# Patient Record
Sex: Male | Born: 1950 | Race: White | Hispanic: No | Marital: Married | Smoking: Former smoker
Health system: Southern US, Community
[De-identification: ages and names within clinical notes are randomized; demographics above are authoritative.]

## PROBLEM LIST (undated history)

## (undated) DIAGNOSIS — I10 Essential (primary) hypertension: Secondary | ICD-10-CM

## (undated) DIAGNOSIS — I214 Non-ST elevation (NSTEMI) myocardial infarction: Secondary | ICD-10-CM

## (undated) DIAGNOSIS — C4490 Unspecified malignant neoplasm of skin, unspecified: Secondary | ICD-10-CM

## (undated) DIAGNOSIS — M542 Cervicalgia: Secondary | ICD-10-CM

## (undated) DIAGNOSIS — E785 Hyperlipidemia, unspecified: Secondary | ICD-10-CM

## (undated) DIAGNOSIS — C61 Malignant neoplasm of prostate: Secondary | ICD-10-CM

## (undated) DIAGNOSIS — I2510 Atherosclerotic heart disease of native coronary artery without angina pectoris: Secondary | ICD-10-CM

## (undated) DIAGNOSIS — R7303 Prediabetes: Secondary | ICD-10-CM

## (undated) DIAGNOSIS — Z87442 Personal history of urinary calculi: Secondary | ICD-10-CM

## (undated) DIAGNOSIS — M1990 Unspecified osteoarthritis, unspecified site: Secondary | ICD-10-CM

## (undated) HISTORY — DX: Non-ST elevation (NSTEMI) myocardial infarction: I21.4

## (undated) HISTORY — PX: LITHOTRIPSY: SUR834

## (undated) HISTORY — DX: Cervicalgia: M54.2

## (undated) HISTORY — DX: Atherosclerotic heart disease of native coronary artery without angina pectoris: I25.10

## (undated) HISTORY — PX: TONSILLECTOMY: SUR1361

## (undated) HISTORY — DX: Malignant neoplasm of prostate: C61

## (undated) HISTORY — DX: Essential (primary) hypertension: I10

## (undated) HISTORY — DX: Hyperlipidemia, unspecified: E78.5

---

## 2006-06-15 ENCOUNTER — Encounter (INDEPENDENT_AMBULATORY_CARE_PROVIDER_SITE_OTHER): Payer: Self-pay | Admitting: Specialist

## 2006-06-15 ENCOUNTER — Ambulatory Visit (HOSPITAL_COMMUNITY): Admission: RE | Admit: 2006-06-15 | Discharge: 2006-06-16 | Payer: Self-pay | Admitting: Urology

## 2006-11-21 HISTORY — PX: PROSTATECTOMY: SHX69

## 2010-10-21 DIAGNOSIS — I214 Non-ST elevation (NSTEMI) myocardial infarction: Secondary | ICD-10-CM

## 2010-10-21 HISTORY — DX: Non-ST elevation (NSTEMI) myocardial infarction: I21.4

## 2010-10-21 HISTORY — PX: CORONARY ARTERY BYPASS GRAFT: SHX141

## 2010-11-08 ENCOUNTER — Inpatient Hospital Stay (HOSPITAL_COMMUNITY)
Admission: EM | Admit: 2010-11-08 | Discharge: 2010-11-12 | Payer: Self-pay | Attending: Thoracic Surgery (Cardiothoracic Vascular Surgery) | Admitting: Thoracic Surgery (Cardiothoracic Vascular Surgery)

## 2010-11-08 ENCOUNTER — Encounter: Payer: Self-pay | Admitting: Thoracic Surgery (Cardiothoracic Vascular Surgery)

## 2010-12-06 ENCOUNTER — Encounter
Admission: RE | Admit: 2010-12-06 | Discharge: 2010-12-06 | Payer: Self-pay | Source: Home / Self Care | Attending: Thoracic Surgery (Cardiothoracic Vascular Surgery) | Admitting: Thoracic Surgery (Cardiothoracic Vascular Surgery)

## 2010-12-06 ENCOUNTER — Ambulatory Visit
Admission: RE | Admit: 2010-12-06 | Discharge: 2010-12-06 | Payer: Self-pay | Source: Home / Self Care | Attending: Thoracic Surgery (Cardiothoracic Vascular Surgery) | Admitting: Thoracic Surgery (Cardiothoracic Vascular Surgery)

## 2010-12-07 ENCOUNTER — Ambulatory Visit: Payer: Self-pay | Admitting: Cardiology

## 2010-12-24 ENCOUNTER — Ambulatory Visit: Payer: Self-pay | Admitting: *Deleted

## 2010-12-28 ENCOUNTER — Ambulatory Visit (INDEPENDENT_AMBULATORY_CARE_PROVIDER_SITE_OTHER): Payer: Federal, State, Local not specified - PPO | Admitting: *Deleted

## 2010-12-28 ENCOUNTER — Encounter: Payer: Self-pay | Admitting: *Deleted

## 2010-12-28 DIAGNOSIS — I10 Essential (primary) hypertension: Secondary | ICD-10-CM

## 2010-12-28 DIAGNOSIS — I214 Non-ST elevation (NSTEMI) myocardial infarction: Secondary | ICD-10-CM

## 2010-12-28 DIAGNOSIS — E785 Hyperlipidemia, unspecified: Secondary | ICD-10-CM | POA: Insufficient documentation

## 2010-12-28 DIAGNOSIS — I2510 Atherosclerotic heart disease of native coronary artery without angina pectoris: Secondary | ICD-10-CM

## 2010-12-28 DIAGNOSIS — C61 Malignant neoplasm of prostate: Secondary | ICD-10-CM

## 2010-12-28 NOTE — Assessment & Plan Note (Signed)
He will start checking his blood pressure at home. He has had no meds today prior to his visit. We will continue with his current regimen.

## 2010-12-28 NOTE — Patient Instructions (Signed)
Overall, he is felt to be doing well. We will plan on seeing him back in about 3 months. Diet, activity, and lifting restrictions are reviewed with him.

## 2010-12-28 NOTE — Assessment & Plan Note (Signed)
He is doing well. We will keep him on his same meds.

## 2010-12-28 NOTE — Progress Notes (Signed)
Mr. Ralph [name] is seen today for a follow up visit. He is doing well. He is walking 2 miles per day. He has had no chest pain or shortness of breath. His appetite is good and he has gained weight. He understands that he needs to cut back on his portions. He has not returned to work yet. He works for the [employer] and still has lifting restrictions. He is tolerating his meds. He has no complaint.  The 15 point review of systems is reviewed. All other review of systems are negative.  Exam:  He is very pleasant and in no acute distress. Vital signs are noted. Skin is warm and dry. Color is normal. HEENT is unremarkable. Lungs are clear. Cardiac exam shows a regular rate and rhythm. Abdomen is soft. He has no peripheral edema. Gait and ROM are intact. He has no neurologic deficits. Left arm incision and sternal incision are stable.

## 2010-12-28 NOTE — Assessment & Plan Note (Signed)
Will continue with statin therapy. Labs will be checked today.

## 2011-01-31 LAB — CBC
HCT: 26.6 % — ABNORMAL LOW (ref 39.0–52.0)
HCT: 33.1 % — ABNORMAL LOW (ref 39.0–52.0)
HCT: 42.4 % (ref 39.0–52.0)
Hemoglobin: 14.4 g/dL (ref 13.0–17.0)
MCH: 30.7 pg (ref 26.0–34.0)
MCH: 30.7 pg (ref 26.0–34.0)
MCH: 30.7 pg (ref 26.0–34.0)
MCHC: 34 g/dL (ref 30.0–36.0)
MCHC: 34.1 g/dL (ref 30.0–36.0)
MCHC: 34.4 g/dL (ref 30.0–36.0)
MCV: 88.7 fL (ref 78.0–100.0)
MCV: 90.4 fL (ref 78.0–100.0)
Platelets: 112 10*3/uL — ABNORMAL LOW (ref 150–400)
Platelets: 123 10*3/uL — ABNORMAL LOW (ref 150–400)
Platelets: 124 10*3/uL — ABNORMAL LOW (ref 150–400)
RBC: 3.36 MIL/uL — ABNORMAL LOW (ref 4.22–5.81)
RBC: 4.69 MIL/uL (ref 4.22–5.81)
RDW: 12.2 % (ref 11.5–15.5)
RDW: 12.2 % (ref 11.5–15.5)
RDW: 12.3 % (ref 11.5–15.5)
RDW: 12.5 % (ref 11.5–15.5)
WBC: 12.2 10*3/uL — ABNORMAL HIGH (ref 4.0–10.5)

## 2011-01-31 LAB — COMPREHENSIVE METABOLIC PANEL
ALT: 12 U/L (ref 0–53)
Albumin: 3.3 g/dL — ABNORMAL LOW (ref 3.5–5.2)
Alkaline Phosphatase: 49 U/L (ref 39–117)
BUN: 12 mg/dL (ref 6–23)
Chloride: 107 mEq/L (ref 96–112)
Glucose, Bld: 84 mg/dL (ref 70–99)
Potassium: 3.5 mEq/L (ref 3.5–5.1)
Sodium: 139 mEq/L (ref 135–145)
Total Bilirubin: 0.7 mg/dL (ref 0.3–1.2)
Total Protein: 5.9 g/dL — ABNORMAL LOW (ref 6.0–8.3)

## 2011-01-31 LAB — BASIC METABOLIC PANEL
BUN: 12 mg/dL (ref 6–23)
BUN: 20 mg/dL (ref 6–23)
CO2: 29 mEq/L (ref 19–32)
Calcium: 7.6 mg/dL — ABNORMAL LOW (ref 8.4–10.5)
Calcium: 8.1 mg/dL — ABNORMAL LOW (ref 8.4–10.5)
Calcium: 8.8 mg/dL (ref 8.4–10.5)
Chloride: 107 mEq/L (ref 96–112)
Creatinine, Ser: 0.99 mg/dL (ref 0.4–1.5)
GFR calc Af Amer: >60 mL/min (ref >60)
GFR calc non Af Amer: >60 mL/min (ref >60)
GFR calc non Af Amer: >60 mL/min (ref >60)
Glucose, Bld: 121 mg/dL — ABNORMAL HIGH (ref 70–99)
Glucose, Bld: 89 mg/dL (ref 70–99)
Potassium: 4.1 mEq/L (ref 3.5–5.1)
Potassium: 4.5 mEq/L (ref 3.5–5.1)
Sodium: 136 mEq/L (ref 135–145)
Sodium: 140 mEq/L (ref 135–145)

## 2011-01-31 LAB — POCT I-STAT GLUCOSE
Glucose, Bld: 104 mg/dL — ABNORMAL HIGH (ref 70–99)
Glucose, Bld: 118 mg/dL — ABNORMAL HIGH (ref 70–99)
Operator id: 3342
Operator id: 3342

## 2011-01-31 LAB — POCT I-STAT 4, (NA,K, GLUC, HGB,HCT)
Glucose, Bld: 155 mg/dL — ABNORMAL HIGH (ref 70–99)
Glucose, Bld: 83 mg/dL (ref 70–99)
Glucose, Bld: 98 mg/dL (ref 70–99)
HCT: 28 % — ABNORMAL LOW (ref 39.0–52.0)
HCT: 31 % — ABNORMAL LOW (ref 39.0–52.0)
HCT: 39 % (ref 39.0–52.0)
Hemoglobin: 10.9 g/dL — ABNORMAL LOW (ref 13.0–17.0)
Hemoglobin: 11.9 g/dL — ABNORMAL LOW (ref 13.0–17.0)
Hemoglobin: 13.3 g/dL (ref 13.0–17.0)
Hemoglobin: 9.5 g/dL — ABNORMAL LOW (ref 13.0–17.0)
Potassium: 3.7 mEq/L (ref 3.5–5.1)
Potassium: 3.9 mEq/L (ref 3.5–5.1)
Potassium: 4.4 mEq/L (ref 3.5–5.1)
Sodium: 136 mEq/L (ref 135–145)
Sodium: 139 mEq/L (ref 135–145)

## 2011-01-31 LAB — POCT I-STAT 3, ART BLOOD GAS (G3+)
Acid-base deficit: 1 mmol/L (ref 0.0–2.0)
Acid-base deficit: 3 mmol/L — ABNORMAL HIGH (ref 0.0–2.0)
Acid-base deficit: 4 mmol/L — ABNORMAL HIGH (ref 0.0–2.0)
Bicarbonate: 22.4 mEq/L (ref 20.0–24.0)
Bicarbonate: 22.9 mEq/L (ref 20.0–24.0)
Bicarbonate: 24 mEq/L (ref 20.0–24.0)
O2 Saturation: 100 %
O2 Saturation: 100 %
O2 Saturation: 93 %
O2 Saturation: 95 %
Patient temperature: 36.4
TCO2: 24 mmol/L (ref 0–100)
TCO2: 24 mmol/L (ref 0–100)
TCO2: 25 mmol/L (ref 0–100)
pCO2 arterial: 42.1 mmHg (ref 35.0–45.0)
pCO2 arterial: 47.2 mmHg — ABNORMAL HIGH (ref 35.0–45.0)
pH, Arterial: 7.385 (ref 7.350–7.450)
pO2, Arterial: 337 mmHg — ABNORMAL HIGH (ref 80.0–100.0)
pO2, Arterial: 64 mmHg — ABNORMAL LOW (ref 80.0–100.0)
pO2, Arterial: 82 mmHg (ref 80.0–100.0)

## 2011-01-31 LAB — BLOOD GAS, ARTERIAL
Drawn by: 275531
FIO2: 0.21 %
Patient temperature: 98.6
pCO2 arterial: 40.6 mmHg (ref 35.0–45.0)
pH, Arterial: 7.386 (ref 7.350–7.450)

## 2011-01-31 LAB — URINALYSIS, ROUTINE W REFLEX MICROSCOPIC
Bilirubin Urine: NEGATIVE
Glucose, UA: NEGATIVE mg/dL
Hgb urine dipstick: NEGATIVE
Specific Gravity, Urine: 1.015 (ref 1.005–1.030)
Urobilinogen, UA: 1 mg/dL (ref 0.0–1.0)

## 2011-01-31 LAB — GLUCOSE, CAPILLARY
Glucose-Capillary: 123 mg/dL — ABNORMAL HIGH (ref 70–99)
Glucose-Capillary: 132 mg/dL — ABNORMAL HIGH (ref 70–99)
Glucose-Capillary: 155 mg/dL — ABNORMAL HIGH (ref 70–99)
Glucose-Capillary: 77 mg/dL (ref 70–99)

## 2011-01-31 LAB — MAGNESIUM
Magnesium: 3 mg/dL — ABNORMAL HIGH (ref 1.5–2.5)
Magnesium: 3.5 mg/dL — ABNORMAL HIGH (ref 1.5–2.5)

## 2011-01-31 LAB — ABO/RH: ABO/RH(D): O NEG

## 2011-01-31 LAB — POCT I-STAT, CHEM 8
BUN: 11 mg/dL (ref 6–23)
Calcium, Ion: 1.11 mmol/L — ABNORMAL LOW (ref 1.12–1.32)
Creatinine, Ser: 0.9 mg/dL (ref 0.4–1.5)
TCO2: 23 mmol/L (ref 0–100)

## 2011-01-31 LAB — POCT I-STAT 3, VENOUS BLOOD GAS (G3P V)
TCO2: 27 mmol/L (ref 0–100)
pCO2, Ven: 51.9 mmHg — ABNORMAL HIGH (ref 45.0–50.0)
pH, Ven: 7.294 (ref 7.250–7.300)
pO2, Ven: 49 mmHg — ABNORMAL HIGH (ref 30.0–45.0)

## 2011-01-31 LAB — PROTIME-INR
INR: 1 (ref 0.00–1.49)
INR: 1.39 (ref 0.00–1.49)

## 2011-01-31 LAB — CROSSMATCH
ABO/RH(D): O NEG
Unit division: 0

## 2011-01-31 LAB — MRSA PCR SCREENING: MRSA by PCR: NEGATIVE

## 2011-01-31 LAB — HEMOGLOBIN AND HEMATOCRIT, BLOOD
HCT: 31.4 % — ABNORMAL LOW (ref 39.0–52.0)
Hemoglobin: 11 g/dL — ABNORMAL LOW (ref 13.0–17.0)

## 2011-01-31 LAB — HEMOGLOBIN A1C: Hgb A1c MFr Bld: 5.4 % (ref <5.7)

## 2011-03-24 ENCOUNTER — Ambulatory Visit (INDEPENDENT_AMBULATORY_CARE_PROVIDER_SITE_OTHER): Payer: Federal, State, Local not specified - PPO | Admitting: Cardiology

## 2011-03-24 ENCOUNTER — Encounter: Payer: Self-pay | Admitting: Cardiology

## 2011-03-24 VITALS — BP 112/64 | HR 56 | Ht 68.0 in | Wt 203.0 lb

## 2011-03-24 DIAGNOSIS — E7800 Pure hypercholesterolemia, unspecified: Secondary | ICD-10-CM

## 2011-03-24 DIAGNOSIS — I2510 Atherosclerotic heart disease of native coronary artery without angina pectoris: Secondary | ICD-10-CM

## 2011-03-24 LAB — BASIC METABOLIC PANEL
BUN: 22 mg/dL (ref 6–23)
Creatinine, Ser: 1 mg/dL (ref 0.4–1.5)
Glucose, Bld: 92 mg/dL (ref 70–99)
Potassium: 4.3 mEq/L (ref 3.5–5.1)
Sodium: 137 mEq/L (ref 135–145)

## 2011-03-24 LAB — LIPID PANEL
LDL Cholesterol: 111 mg/dL — ABNORMAL HIGH (ref 0–99)
Total CHOL/HDL Ratio: 3
Triglycerides: 89 mg/dL (ref 0.0–149.0)

## 2011-03-24 LAB — HEPATIC FUNCTION PANEL
Albumin: 3.7 g/dL (ref 3.5–5.2)
Alkaline Phosphatase: 64 U/L (ref 39–117)
Total Bilirubin: 0.8 mg/dL (ref 0.3–1.2)

## 2011-03-24 NOTE — Assessment & Plan Note (Signed)
Is doing well from a cardiac standpoint we'll continue his current medications. I will have him see Dr. Simona Huh in each in in approximate 8 months it anniversary of his bypass surgery. We will check C. Met and lipids today.

## 2011-03-24 NOTE — Progress Notes (Signed)
Subjective:   Ralph [name] is seen today for followup after having coronary artery bypass grafting in December of 2012 in general, he's done well he is back exercising and playing some golf on occasion. He had a history of multivessel coronary disease with an ejection fraction of 60%. He had coronary artery bypass grafting with a LIMA to the LAD, left radial artery to the obtuse marginal 2, saphenous vein graft to diagonal, a saphenous vein graft to the posterior descending artery. Overall, he's done well from a cardiac standpoint.  His other problems include hypertension, hyperlipemia, remote tobacco abuse, a history of prostate cancer with previous radical prostatectomy.  Current Outpatient Prescriptions  Medication Sig Dispense Refill  . aspirin 325 MG EC tablet Take 325 mg by mouth daily.        . fish oil-omega-3 fatty acids 1000 MG capsule Take by mouth daily.       Marland Kitchen glucosamine-chondroitin 500-400 MG tablet Take 3 tablets by mouth daily.       Marland Kitchen lisinopril (PRINIVIL,ZESTRIL) 5 MG tablet Take 5 mg by mouth daily.        . metoprolol (LOPRESSOR) 50 MG tablet Take 50 mg by mouth 2 (two) times daily.        . simvastatin (ZOCOR) 40 MG tablet Take 40 mg by mouth at bedtime.        Marland Kitchen DISCONTD: multivitamin (THERAGRAN) per tablet Take 1 tablet by mouth daily.          No Known Allergies  Patient Active Problem List  Diagnoses  . CAD (coronary artery disease)  . HTN (hypertension)  . Hyperlipidemia  . Prostate cancer  . NSTEMI (non-ST elevated myocardial infarction)    History  Smoking status  . Former Smoker -- 0.8 packs/day for 10 years  . Types: Cigarettes  . Quit date: 11/21/1988  Smokeless tobacco  . Never Used  Comment: Stopped over 20 years ago.    History  Alcohol Use No    Family History  Problem Relation Age of Onset  . Alzheimer's disease Mother   . Stroke Father     Review of Systems:   The patient denies any heat or cold intolerance.  No weight gain or weight  loss.  The patient denies headaches or blurry vision.  There is no cough or sputum production.  The patient denies dizziness.  There is no hematuria or hematochezia. He does have some urinary frequency that is worse since he has low back pain. The patient denies any muscle aches or arthritis.  The patient denies any rash.  The patient denies frequent falling or instability.  There is no history of depression or anxiety.  All other systems were reviewed and are negative.   Physical Exam:   Weight is 203. Blood pressure is 112/64 heart rate 56.The head is normocephalic and atraumatic.  Pupils are equally round and reactive to light.  Sclerae nonicteric.  Conjunctiva is clear.  Oropharynx is unremarkable.  There's adequate oral airway.  Neck is supple there are no masses.  Thyroid is not enlarged.  There is no lymphadenopathy.  Lungs are clear.  Chest is symmetric.  Heart shows a regular rate and rhythm.  S1 and S2 are normal.  There is no murmur click or gallop.  Abdomen is soft normal bowel sounds.  There is no organomegaly.  Genital and rectal deferred.  Extremities are without edema.  Peripheral pulses are adequate.  Neurologically intact.  Full range of motion.  The patient is not  depressed.  Skin is warm and dry.  Assessment / Plan:

## 2011-03-28 ENCOUNTER — Other Ambulatory Visit: Payer: Self-pay | Admitting: *Deleted

## 2011-03-28 DIAGNOSIS — E785 Hyperlipidemia, unspecified: Secondary | ICD-10-CM

## 2011-03-28 MED ORDER — ATORVASTATIN CALCIUM 40 MG PO TABS: 40.0000 mg | ORAL_TABLET | Freq: Every day | ORAL | Status: DC

## 2011-04-05 NOTE — Assessment & Plan Note (Signed)
OFFICE VISIT   Ralph [name]  DOB:  1951/09/12                                        December 06, 2010  CHART #:  [id number]   HISTORY OF PRESENT ILLNESS:  The patient returns for routine followup,  status post coronary artery bypass grafting x4 on November 09, 2010.  His postoperative recovery has been uncomplicated.  Since hospital  discharge, the patient has continued to do well.  He was seen in  followup by Dr. Deborah [name] last week and returns to our office for routine  followup today.  He reports only mild residual soreness in his chest.  He is not using any sort of pain relievers at this time.  He still has  some numbness along the left anterior chest wall as well as along the  left radial artery harvest incision.  Otherwise, he feels pretty good.  His appetite is good.  He is sleeping better at night.  He has no  shortness of breath.  The remainder of his review of systems is  unremarkable.   Current medications include aspirin, Lopressor, Zocor, fish oil capsule,  glucosamine.   PHYSICAL EXAMINATION:  General:  Notable for well-appearing male, with  blood pressure 101/69, pulse 60 and regular, oxygen saturation 98% on  room air.  Chest:  Examination of the chest reveals a median sternotomy  incision that is healing very nicely.  The sternum is stable on  palpation.  Breath sounds are clear to auscultation and symmetrical  bilaterally.  No wheezes, rales or rhonchi are noted.  Cardiovascular:  Regular rate and rhythm.  No murmurs, rubs or gallops are appreciated.  The left forearm radial artery harvest incision is healing nicely.  The  left hand is intact from a neurovascular standpoint.  The small incision  in the right thigh from endoscopic vein harvest is also healing well.  Extremities:  There is no lower extremity edema.   DIAGNOSTIC TEST:  Chest x-ray performed today at the [facility] is reviewed.  This demonstrates clear  lung fields bilaterally.  There is trivial left pleural effusion.  All the sternal wires appear  intact.  No other abnormalities are noted.   IMPRESSION:  Excellent progress following recent coronary artery bypass  grafting.   PLAN:  I have encouraged the patient to continue to gradually increase  his physical activity as tolerated with his primary limitation at this  point remaining that he refrain from heavy lifting or strenuous use of  his arms or shoulders for at least another 2 months.  He is asked about  golfing.  I have suggested that he could start working on chipping and  pudding around the green, but it will be another 2 months before he can  take a full sling.  I do think it is reasonable for him to resume  driving an automobile at this time.  I have also encouraged him to get  involved in the cardiac rehab program.  All of his questions have been  addressed.  In the future, the patient will call and return to see [place] as  needed.   [name], M.D.  Electronically Signed   CHO/MEDQ  D:  12/06/2010  T:  12/06/2010  Job:  [id number]   cc:   Ralph [name], M.D.  Ralph [name]  Ralph [name], M.D.

## 2011-04-08 NOTE — Op Note (Signed)
NAME:  Ralph [name], Ralph [name]               ACCOUNT NO.:  [id number]   MEDICAL RECORD NO.:  [id number]          PATIENT TYPE:  AMB   LOCATION:  DAY                          FACILITY:  [facility]   PHYSICIAN:  Excell Seltzer. Annabell Howells, M.D.    DATE OF BIRTH:  [birth year]   DATE OF PROCEDURE:  06/15/2006  DATE OF DISCHARGE:                                 OPERATIVE REPORT   PROCEDURE:  Robot-assisted laparoscopic radical prostatectomy and pelvic  lymph node dissection.   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   POSTOPERATIVE DIAGNOSES:  Stage T2B Gleason 7 adenocarcinoma of the  prostate.   SURGEON:  Excell Seltzer. Annabell Howells, M.D.   ASSISTANT:  Crecencio Mc, M.D.   ANESTHESIA:  General.   DRAINS:  Foley catheter and Blake drain.   SPECIMENS:  Prostate, seminal vesicles and right and left pelvic lymph  nodes.   BLOOD LOSS:  200 mL.   COMPLICATIONS:  None.   INDICATIONS:  [name] is a 60 year old white male, who was found to have  an elevated PSA and nodular prostate by Dr. Rito Ehrlich.  A prostate biopsy was  performed, which revealed extensive disease in the right prostatic lobe with  all six cores being involved with 50%-80% cancer with the highest Gleason  score being a 3 + 4 in the right upper paramedian biopsy.  All of the left  biopsies were benign.  On physical exam, he has a definite T2B lesion.  After discussion of the options, he has elected a robotic-assisted  prostatectomy.  We discussed the need not to do a nerve spare on the right  side, due to the bulk of this disease and the physical exam findings.   FINDINGS AND PROCEDURE:  The patient was fitted with TAS hose.  He was taken  to the operating room, where general anesthetic was induced.  He was given  Ancef preoperatively.  He had had a mechanical bowel prep, as well.  He was  placed in the lithotomy position.  His abdomen was shaved.  A red rubber  rectal tube was placed.  He was prepped with Betadine  solution, then draped  in the usual sterile fashion.  An Op-Site was placed to secure the drapes.   A periumbilical incision was made, 18 cm cephalad to the pubis,  approximately 3 cm in length.  The fascia was incised with the Bovie.  The  peritoneum was identified, incised with a knife, and punctured with a  hemostat.  Finger dissection was used to insure no adhesions to the anterior  abdominal wall.  A Hasson trocar was then placed and the incision was  secured around it with 2-0 Vicryl sutures.  The abdomen was then inspected  with the camera, while the abdomen was insufflated.  At this point,  additional ports were placed.  A 12 mm port was placed laterally on the  right.  A 5 mm port was placed to the right and superior to the  periumbilical port and 8 mm ports were placed 10 cm lateral to the  periumbilical port on each  side, 16 cm above the pubis.  The fourth arm port  was placed approximately 8-10 cm lateral to the other left 8 mm port.   Once all the ports were in position and the Foley catheter had been placed  and the bladder drained, the robot was brought onto the field and engaged.  Initially, monopolar scissors and a TK dissector were used in the first and  second working ports and the third working port was a blunt grasper.  The  bladder was then filled with sterile fluid and the dissection was initiated.   The peritoneum was incised anteriorly, superior to the bladder, and the  bladder was then dropped off the anterior abdominal wall, exposing the  anterior aspect of the prostate.  The endopelvic fascia was incised lateral  to the prostate, initially on the right, and the lateral plane was developed  between the levators and the prostate.  This was carried down to the  perirectal fat.  The puboprostatic ligaments were incised with cautery, as  well.  The left endopelvic fascia was then incised and dissection was  performed to free up the left lateral aspect of the  prostate.   At this point, an endo-GIA stapler was placed through the 12 mm working port  and the dorsal vein complex was stapled and divided.   At this point, we turned our attention to the bladder neck, which was  located, using tension on the Foley balloon.  The bladder neck was then  incised with the monopolar scissors, down to the bladder neck.  The Foley  was identified and then delivered from the prostate and then secured over  the pubis, using the fourth arm.   We then changed to a 30 degree down scope and incised the posterior bladder  neck, exposing the structures.  The right vas was then dissected out and  divided.  The right seminal vesicle was then dissected out, using monopolar  cautery.  Once the right structures were freed, the left vas was then  dissected out and divided, followed by dissection of the left seminal  vesicle.  Once all the structures had been freed, they were grasped and held  cephalad while the posterior plane was developed between Denonvilliers' and  rectal fat.  Once the posterior plane had been adequately developed out to  the apex of the prostate, we turned our attention to the left nerve-spare.   The Foley was removed, freeing the prostate, and the fourth arm was moved up  into the abdominal cavity.  The prostate was grasped with the Prestige and  held to the right.  The periprostatic fascia was identified and incised on  the anterior aspect of the prostate.  The plane was then developed to  perform the nerve-spare.  The fascia with the nerve bundle was then  reflected off the prostate, using sharp and blunt dissection.  Once the  nerve had been freed from base to apex, we turned our attention to the left  pedicle.  The pedicle was then developed in packets and controlled with  clips and the tissue was divided, elevating the left side of the prostate.  Great care was taken to avoid entry into the prostatic capsule.  Once the pedicle had been  completely divided, the remainder of the attachments  between the prostate and the nerve bundle were then taken down sharply, up  to the apex of the prostate.   We then turned our attention to the right prostatic pedicle.  A nerve-spare  was not performed on this side, due to the extent of the patient's cancer.  The pedicle was taken down, using the monopolar scissors and clips, out to  the apex, carrying the neurovascular bundle tissues with the prostate.   Once the pedicle had been taken down on the right, we turned our attention  to the apex.  There was some bleeding a the dorsal vein, so a figure-of-  eight 2-0 Vicryl was placed, using a CT needle, and tied.  This provided  excellent hemostasis.   The urethra was then incised initially with cautery through the residual  dorsal vein complex and sharply with scissors.  Once the Foley had been  exposed, it was pulled back and the lateral and posterior urethra were  divided.  The residual apical attachments were then taken down and the  prostate was moved into the upper pelvis.   Inspection of the pelvic fossa revealed some bleeding on the left and was  controlled with clips with great care being taken to avoid the neurovascular  bundle.  We then placed some Surgicel in the apical bed to provide  additional hemostasis for some mild oozing.   At this point, we turned our attention to node dissection.  The working arms  were moved to provide exposure and the left pelvic lymph node dissection was  performed.  The iliac vein was identified and the lymphatic tissue was  reflected inferomedially off the vein.  The obturator nerve and accessory  obturator vessels were identified and were avoided.  The nerve packet was  divided distally with clips and divided.  The proximal extent was then  dissected out, divided after clipping, and the tissue was removed.  Some  residual tissue near the pubis was dissected out and removed, as well.   Hemostasis was felt to be adequate.   We then turned our attention to the right node package.  The lymphatic  packet was then developed off the iliac vein until the pelvic side wall  fascia was identified.  The obturator nerve was then identified medially and  the packet was freed from the nerve.  The packet was then clipped anteriorly  and divided and then dissected out more posteriorly and clipped proximally  and divided.  The node package was then removed.  There was some minor  oozing in the right dissection bed.  This was inspected, did not seem to be  significant, and the Surgicel was moved from the apical bed to the right  node dissection bed.   At this point, inspection of the pelvic floor revealed no active bleeding.  Previously, the pelvic floor had been irrigated and the rectum insufflated  with no evidence of rectal injury.   At this point, we proceeded with our anastomosis.  A 3-0 Vicryl on an SH needle was used to provide a stay suture to relieve tension.  Once this had  been placed at the 6 o'clock position, 3-0 Monocryl on RB1 needles was then  used to perform the anastomosis.  The left side was performed with a running  brown Monocryl, the right side with a running purple Monocryl.  These had  been tied together prior to placement.  Once the anastomosis had been  completed, the suture line was tightened and tied and a #20 [place] Coude  Foley catheter was placed without difficulty into the bladder.  The catheter  was irrigated without evidence of anastomotic leakage and with easy return.  The sutures were  trimmed and all needles were removed.   At this point, the round Blake drain was placed through the fourth arm  working port and positioned in the pelvis.  The working port was then  removed and the drain was secured with a nylon stitch.   We then removed the robot and used the camera through the 12 mm port to  position the laparoscopic entrapment sack.  The prostate  was placed within  the sack and the sack was then deployed and the camera was then replaced  through the umbilical port.   At this point, all of the remaining ports were removed under direct vision.  The 12 mm port was closed, using 3-0 Vicryl on a needle passer and the  specimen was then removed through the umbilical port.  Some enlargement of  the fascial incision was required.  At this point, the anterior rectus  fascia of the umbilical port was closed, using a running 0 Vicryl.  All of  the port sites were infiltrated with 0.25% Marcaine and the skin was closed  with clips for all of the ports.   At this point, the drapes were removed.  The catheter was placed to straight  drainage, the drain to bulb suction, a dressing was applied.  The patient  was taken down from lithotomy position, his anesthetic was reversed.  He was  moved to the recovery room in stable condition.  There were no  complications.      Excell Seltzer. Annabell Howells, M.D.  Electronically Signed     JJW/MEDQ  D:  06/15/2006  T:  06/15/2006  Job:  [id number]   cc:   Dennie Maizes, M.D.  Fax: [phone]   Wyvonnia Lora  Fax: [phone]

## 2011-05-06 IMAGING — CR DG CHEST 2V
2 series · 2 of 2 positions shown · non-contrast
Comparison: 11/11/2010 and earlier.

CLINICAL DATA: 59-year-old male status post CABG and October 2010.

CHEST - 2 VIEW

[w chest pa]
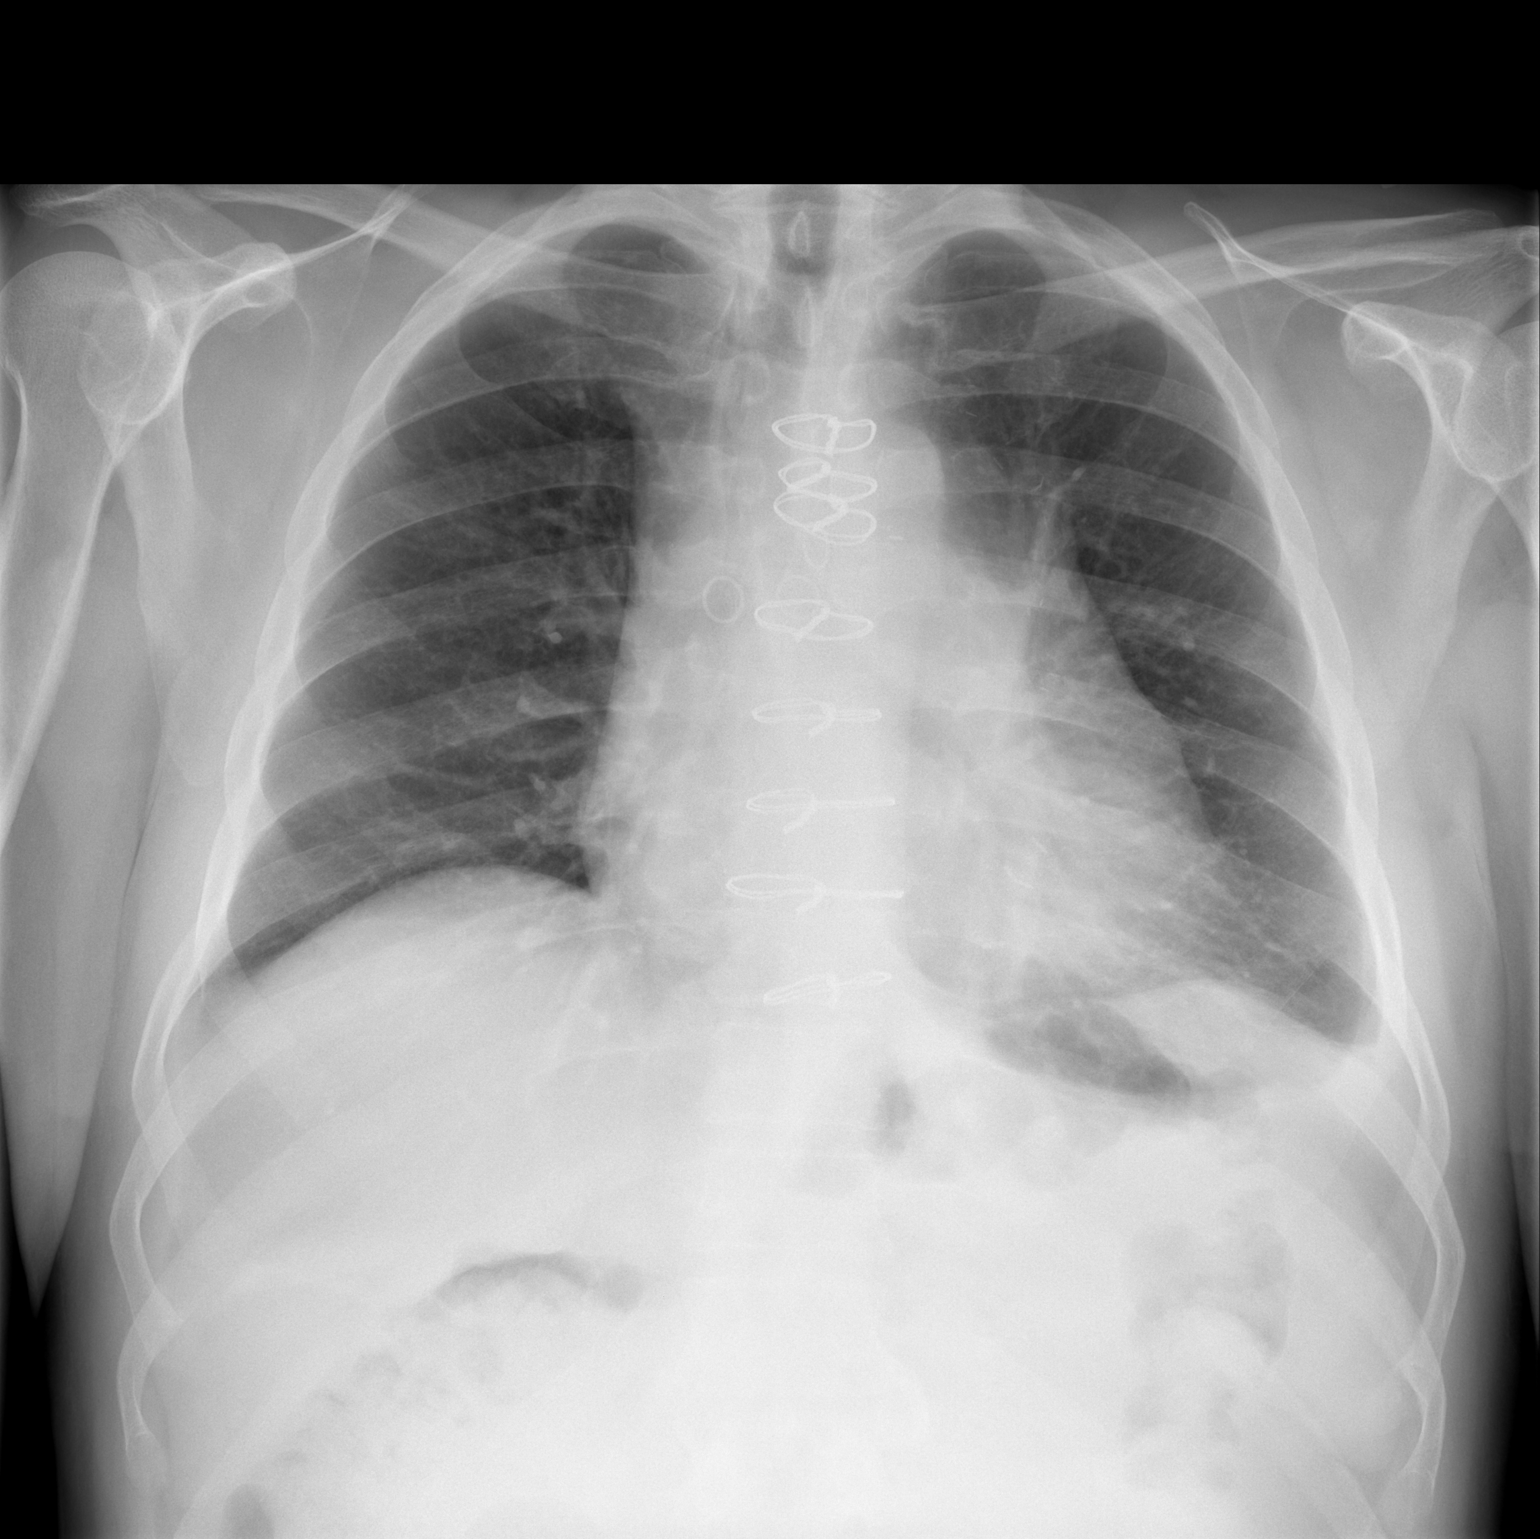

[w chest lat]
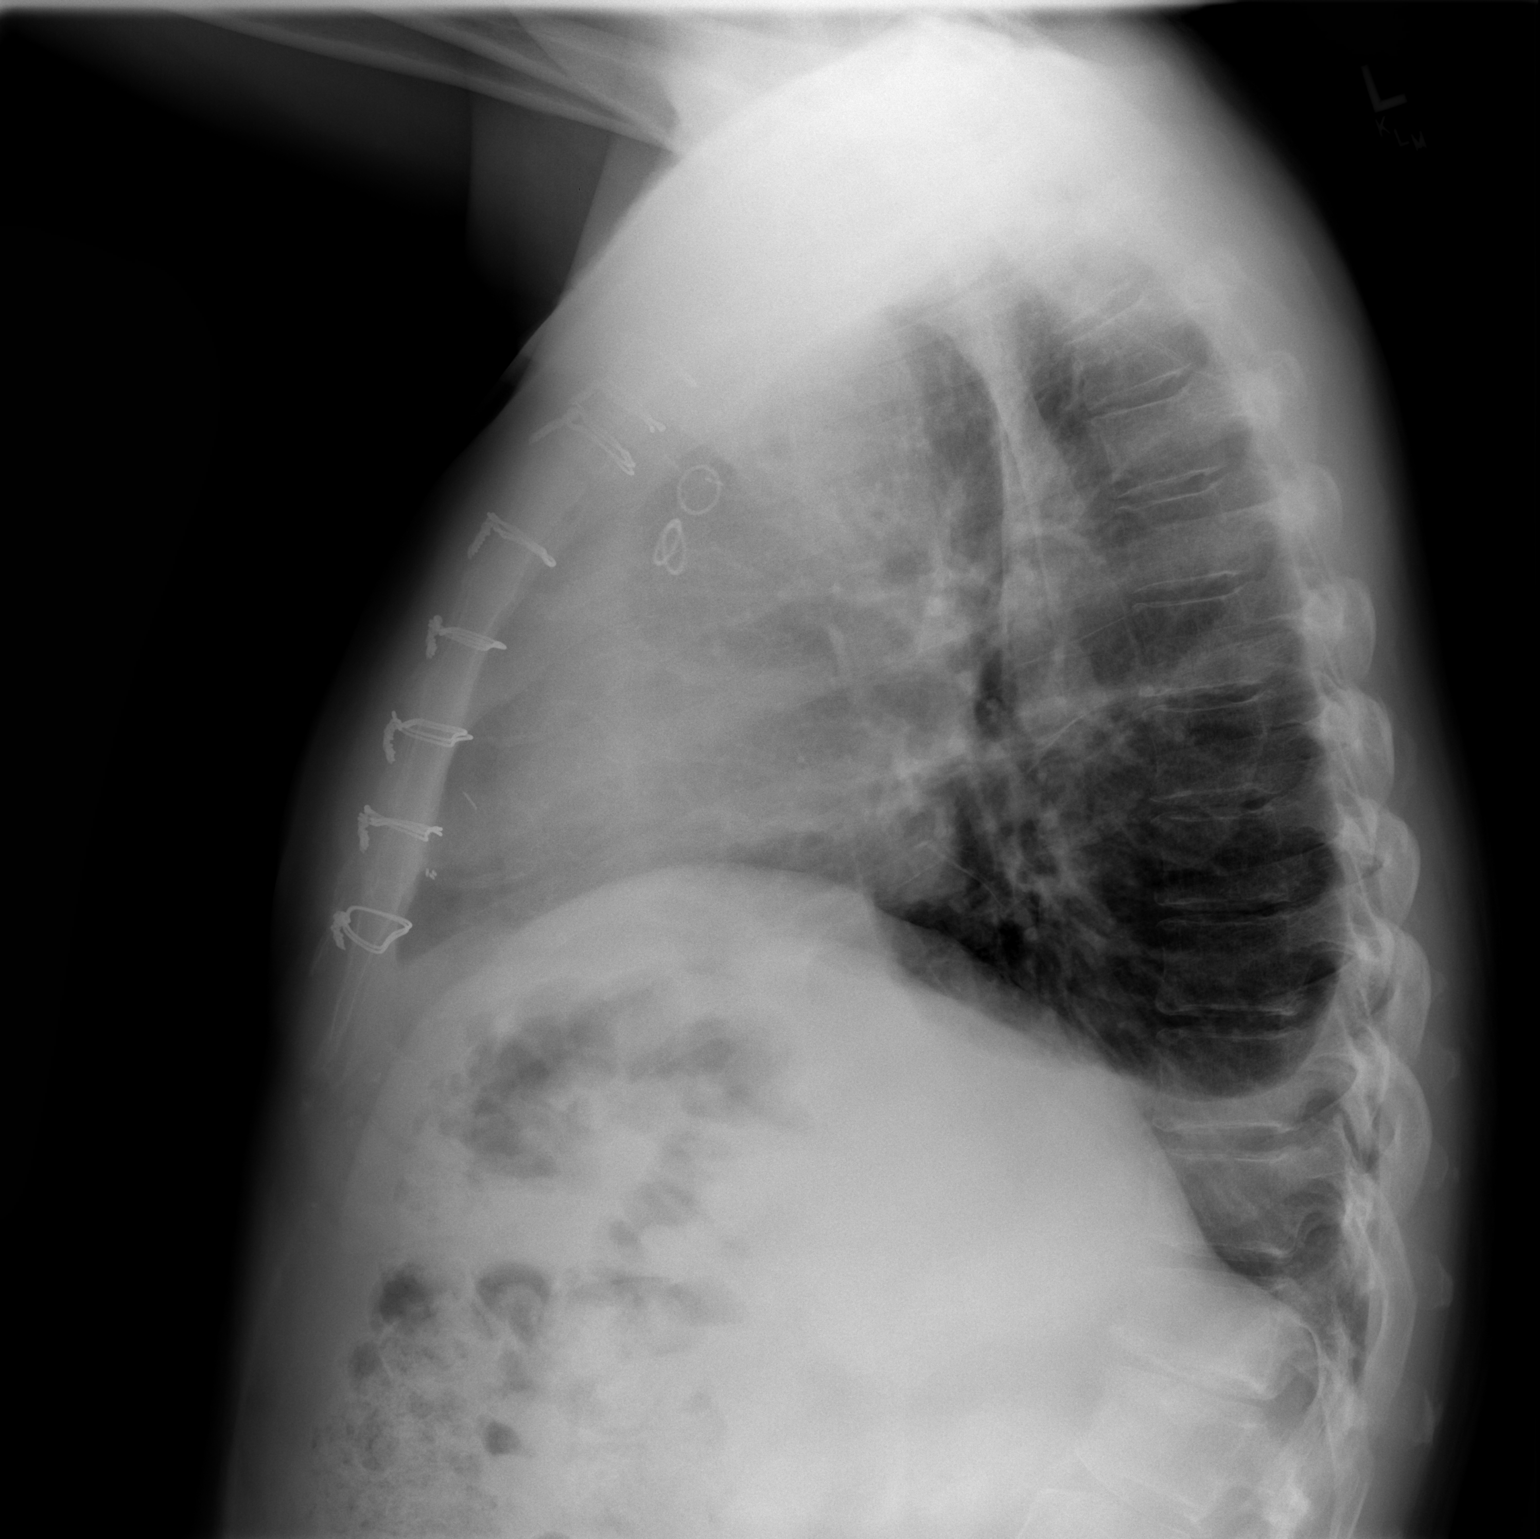

[2 of 2 positions shown; findings below may reference images not displayed]

FINDINGS: Stable cardiomegaly and mediastinal contours.  Stable
sequelae of CABG.  Stable lung volumes.  Right pleural effusion
appears resolved.  Small left pleural effusion remains.  Resolved
left perihilar atelectasis.  No pulmonary edema. Stable visualized
osseous structures.
IMPRESSION: Resolved right pleural effusion and atelectasis.  Small left
pleural effusion remains.

## 2011-05-16 ENCOUNTER — Other Ambulatory Visit: Payer: Federal, State, Local not specified - PPO | Admitting: *Deleted

## 2011-12-05 ENCOUNTER — Other Ambulatory Visit: Payer: Self-pay | Admitting: *Deleted

## 2011-12-05 MED ORDER — LISINOPRIL 5 MG PO TABS: 5.0000 mg | ORAL_TABLET | Freq: Every day | ORAL | Status: DC

## 2012-02-11 ENCOUNTER — Encounter: Payer: Self-pay | Admitting: Cardiology

## 2012-02-11 DIAGNOSIS — R9430 Abnormal result of cardiovascular function study, unspecified: Secondary | ICD-10-CM | POA: Insufficient documentation

## 2012-02-13 ENCOUNTER — Ambulatory Visit (INDEPENDENT_AMBULATORY_CARE_PROVIDER_SITE_OTHER): Payer: Federal, State, Local not specified - PPO | Admitting: Cardiology

## 2012-02-13 ENCOUNTER — Encounter: Payer: Self-pay | Admitting: Cardiology

## 2012-02-13 VITALS — BP 133/79 | HR 58 | Ht 68.0 in | Wt 223.0 lb

## 2012-02-13 DIAGNOSIS — Z79899 Other long term (current) drug therapy: Secondary | ICD-10-CM

## 2012-02-13 DIAGNOSIS — Z951 Presence of aortocoronary bypass graft: Secondary | ICD-10-CM

## 2012-02-13 DIAGNOSIS — C61 Malignant neoplasm of prostate: Secondary | ICD-10-CM

## 2012-02-13 DIAGNOSIS — E785 Hyperlipidemia, unspecified: Secondary | ICD-10-CM

## 2012-02-13 DIAGNOSIS — I10 Essential (primary) hypertension: Secondary | ICD-10-CM

## 2012-02-13 DIAGNOSIS — I2510 Atherosclerotic heart disease of native coronary artery without angina pectoris: Secondary | ICD-10-CM

## 2012-02-13 DIAGNOSIS — R011 Cardiac murmur, unspecified: Secondary | ICD-10-CM

## 2012-02-13 DIAGNOSIS — M542 Cervicalgia: Secondary | ICD-10-CM

## 2012-02-13 MED ORDER — ASPIRIN EC 81 MG PO TBEC: 81.0000 mg | DELAYED_RELEASE_TABLET | Freq: Every day | ORAL | Status: AC

## 2012-02-13 NOTE — Assessment & Plan Note (Signed)
We need to obtain a fasting lipid profile and be in touch with him with the results.

## 2012-02-13 NOTE — Assessment & Plan Note (Addendum)
Coronary disease is stable. He is not having any significant symptoms. He is 15 months out from his bypass surgery. He needs no further workup at this time. He can reduce his aspirin to 81 mg daily. Also I will obtain a copy of his carotid Doppler done prior to his bypass surgery.

## 2012-02-13 NOTE — Progress Notes (Signed)
HPI The patient is seen today to establish cardiology care with me in the heat in the office. In the past he saw a doctor Deborah Chalk , who has retired. He underwent CABG in December, 2011. This was done by Dr Cornelius Moras. He is doing well. Originally he had some chest pressure and shortness of breath with walking. This was treated well with his bypass and he has not had any recurrent problems. There's been no syncope or presyncope. As part of today's evaluation I have reviewed all of the old cardiology records. I have also reviewed primary care records sent by his primary team.  There is a history of normal left ventricular function. There is also a history of hypertension.  He does have some discomfort along the left neck up into his left shoulder. This appears to be a musculoskeletal pain. No Known Allergies  Current Outpatient Prescriptions  Medication Sig Dispense Refill  . aspirin 325 MG EC tablet Take 325 mg by mouth daily.        Marland Kitchen atorvastatin (LIPITOR) 40 MG tablet Take 1 tablet (40 mg total) by mouth daily.  30 tablet  11  . glucosamine-chondroitin 500-400 MG tablet Take 3 tablets by mouth daily.       Marland Kitchen lisinopril (PRINIVIL,ZESTRIL) 5 MG tablet Take 1 tablet (5 mg total) by mouth daily.  30 tablet  7  . metoprolol (LOPRESSOR) 50 MG tablet Take 50 mg by mouth 2 (two) times daily.        . Omega-3 Fatty Acids (FISH OIL TRIPLE STRENGTH) 1400 MG CAPS Take 1 capsule by mouth daily.        History   Social History  . Marital Status: Single    Spouse Name: N/A    Number of Children: N/A  . Years of Education: N/A   Occupational History  . Not on file.   Social History Main Topics  . Smoking status: Former Smoker -- 0.8 packs/day for 10 years    Types: Cigarettes    Quit date: 11/21/1988  . Smokeless tobacco: Never Used   Comment: Stopped over 20 years ago.  . Alcohol Use: No  . Drug Use: Not on file  . Sexually Active: Not on file   Other Topics Concern  . Not on file   Social  History Narrative  . No narrative on file    Family History  Problem Relation Age of Onset  . Alzheimer's disease Mother   . Stroke Father     Past Medical History  Diagnosis Date  . CAD (coronary artery disease)   . HTN (hypertension)   . Hyperlipidemia   . Prostate cancer   . NSTEMI (non-ST elevated myocardial infarction) December 2011  . Hx of CABG     December, 2011, Cornelius Moras  . Ejection fraction     EF 60% by history  . Prostate cancer     Radical prostatectomy approximately 2008    Past Surgical History  Procedure Date  . Coronary artery bypass graft December 2011    LIMA-LAD, L Radial-OM2, SVG-DX, SVG-PD  . Prostatectomy     ROS  Patient denies fever, chills, headache, sweats, rash, change in vision, change in hearing, chest pain, cough, nausea vomiting, urinary symptoms. All other systems are reviewed and are negative other than the history of present illness.  PHYSICAL EXAM Patient is oriented to person time and place. Affect is normal. Head is atraumatic. There is no xanthelasma. There is no jugulovenous distention. There no carotid bruits.  Lungs are clear. Respiratory effort is nonlabored. Cardiac exam reveals S1 and S2. There no clicks. There is a 2/6 crescendo decrescendo systolic murmur. The abdomen is soft. There is no peripheral edema. There no musculoskeletal deformities. There are no skin rashes.  Filed Vitals:   02/13/12 1439  BP: 133/79  Pulse: 58  Height: 5\' 8"  (1.727 m)  Weight: 223 lb (101.152 kg)   EKG is done today and reviewed by me. There is normal sinus rhythm. There is no significant abnormality.  ASSESSMENT & PLAN

## 2012-02-13 NOTE — Assessment & Plan Note (Signed)
I believe that the patient's left neck discomfort with radiation to his left shoulder is a musculoskeletal pain. It is not ischemic. No further workup.

## 2012-02-13 NOTE — Assessment & Plan Note (Signed)
Blood pressure is controlled. No change in therapy. 

## 2012-02-13 NOTE — Assessment & Plan Note (Signed)
There is a soft systolic murmur. It does not sound pathologic. I feel that he does not need an echo at this time.

## 2012-02-13 NOTE — Patient Instructions (Signed)
Your physician you to follow up in 1 year. You will receive a reminder letter in the mail one-two months in advance. If you don't receive a letter, please call our office to schedule the follow-up appointment. Decrease Aspirin to 81 mg daily.  Your physician recommends that you go to the [facility] for a FASTING lipid profile and liver function labs. Do not eat or drink after midnight.  If the results of your test are normal or stable, you will receive a letter. If they are abnormal, the nurse will contact you by phone.

## 2012-02-29 ENCOUNTER — Encounter: Payer: Self-pay | Admitting: *Deleted

## 2012-03-18 ENCOUNTER — Other Ambulatory Visit: Payer: Self-pay | Admitting: Cardiology

## 2012-03-23 ENCOUNTER — Ambulatory Visit (INDEPENDENT_AMBULATORY_CARE_PROVIDER_SITE_OTHER): Payer: Federal, State, Local not specified - PPO | Admitting: Urology

## 2012-03-23 DIAGNOSIS — N393 Stress incontinence (female) (male): Secondary | ICD-10-CM

## 2012-03-23 DIAGNOSIS — N529 Male erectile dysfunction, unspecified: Secondary | ICD-10-CM

## 2012-03-23 DIAGNOSIS — Z8546 Personal history of malignant neoplasm of prostate: Secondary | ICD-10-CM

## 2012-08-13 ENCOUNTER — Other Ambulatory Visit: Payer: Self-pay | Admitting: Cardiology

## 2012-08-13 MED ORDER — LISINOPRIL 5 MG PO TABS: 5.0000 mg | ORAL_TABLET | Freq: Every day | ORAL | Status: DC

## 2013-03-13 ENCOUNTER — Other Ambulatory Visit: Payer: Self-pay

## 2013-03-13 MED ORDER — ATORVASTATIN CALCIUM 40 MG PO TABS: 40.0000 mg | ORAL_TABLET | Freq: Every day | ORAL | Status: DC

## 2013-03-13 NOTE — Telephone Encounter (Signed)
Patient Instructions    Your physician you to follow up in 1 year. You will receive a reminder letter in the mail one-two months in advance. If you don't receive a letter, please call our office to schedule the follow-up appointment. Decrease Aspirin to 81 mg daily.   Your physician recommends that you go to the [facility] for a FASTING lipid profile and liver function labs. Do not eat or drink after midnight.   If the results of your test are normal or stable, you will receive a letter. If they are abnormal, the nurse will contact you by phone.             Previous Visit      Provider Department Encounter #    01/31/2012 10:57 AM Willa Rough, MD [facility] Maryruth Bun [id number]

## 2013-04-19 ENCOUNTER — Other Ambulatory Visit: Payer: Self-pay

## 2013-04-19 MED ORDER — ATORVASTATIN CALCIUM 40 MG PO TABS: 40.0000 mg | ORAL_TABLET | Freq: Every day | ORAL | Status: DC

## 2013-04-25 ENCOUNTER — Other Ambulatory Visit: Payer: Self-pay | Admitting: *Deleted

## 2013-04-25 MED ORDER — LISINOPRIL 5 MG PO TABS: 5.0000 mg | ORAL_TABLET | Freq: Every day | ORAL | Status: DC

## 2013-05-05 ENCOUNTER — Other Ambulatory Visit: Payer: Self-pay | Admitting: Cardiology

## 2013-05-21 ENCOUNTER — Other Ambulatory Visit: Payer: Self-pay | Admitting: Cardiology

## 2013-05-23 ENCOUNTER — Other Ambulatory Visit: Payer: Self-pay | Admitting: *Deleted

## 2013-05-23 MED ORDER — ATORVASTATIN CALCIUM 40 MG PO TABS: ORAL_TABLET | ORAL | Status: DC

## 2013-07-23 ENCOUNTER — Ambulatory Visit: Payer: Federal, State, Local not specified - PPO | Admitting: Cardiology

## 2013-08-17 ENCOUNTER — Other Ambulatory Visit: Payer: Self-pay | Admitting: Cardiology

## 2013-08-19 ENCOUNTER — Other Ambulatory Visit: Payer: Self-pay | Admitting: Cardiology

## 2013-08-27 ENCOUNTER — Encounter: Payer: Self-pay | Admitting: Cardiology

## 2013-08-29 ENCOUNTER — Ambulatory Visit (INDEPENDENT_AMBULATORY_CARE_PROVIDER_SITE_OTHER): Payer: Federal, State, Local not specified - PPO | Admitting: Cardiology

## 2013-08-29 ENCOUNTER — Encounter: Payer: Self-pay | Admitting: Cardiology

## 2013-08-29 VITALS — BP 171/92 | HR 55 | Ht 68.0 in | Wt 232.0 lb

## 2013-08-29 DIAGNOSIS — R001 Bradycardia, unspecified: Secondary | ICD-10-CM

## 2013-08-29 DIAGNOSIS — R9389 Abnormal findings on diagnostic imaging of other specified body structures: Secondary | ICD-10-CM | POA: Insufficient documentation

## 2013-08-29 DIAGNOSIS — I10 Essential (primary) hypertension: Secondary | ICD-10-CM

## 2013-08-29 DIAGNOSIS — R011 Cardiac murmur, unspecified: Secondary | ICD-10-CM

## 2013-08-29 DIAGNOSIS — E785 Hyperlipidemia, unspecified: Secondary | ICD-10-CM

## 2013-08-29 DIAGNOSIS — I498 Other specified cardiac arrhythmias: Secondary | ICD-10-CM

## 2013-08-29 DIAGNOSIS — I2510 Atherosclerotic heart disease of native coronary artery without angina pectoris: Secondary | ICD-10-CM

## 2013-08-29 NOTE — Progress Notes (Signed)
HPI   Patient is seen today to followup coronary artery disease. I saw him last March, 2013. At that time he was establishing here in our [place]  office. In the past he has been followed by Dr. Deborah Chalk who retired. He underwent bypass surgery 2011. Symptoms at that time were chest pressure and shortness of breath with walking. He has not had any return.  No Known Allergies  Current Outpatient Prescriptions  Medication Sig Dispense Refill  . aspirin 81 MG tablet Take 81 mg by mouth daily.      Ralph [name] atorvastatin (LIPITOR) 40 MG tablet TAKE 1 TABLET BY MOUTH EVERY DAY/NEEDS FOLLOW UP  30 tablet  2  . glucosamine-chondroitin 500-400 MG tablet Take 3 tablets by mouth daily.       Ralph [name] lisinopril (PRINIVIL,ZESTRIL) 5 MG tablet TAKE 1 TABLET BY MOUTH DAILY.  30 tablet  3  . metoprolol (LOPRESSOR) 50 MG tablet Take 50 mg by mouth 2 (two) times daily.        . Omega-3 Fatty Acids (FISH OIL TRIPLE STRENGTH) 1400 MG CAPS Take 1 capsule by mouth daily.       No current facility-administered medications for this visit.    History   Social History  . Marital Status: Single    Spouse Name: N/A    Number of Children: N/A  . Years of Education: N/A   Occupational History  . Not on file.   Social History Main Topics  . Smoking status: Former Smoker -- 0.80 packs/day for 10 years    Types: Cigarettes    Quit date: 11/21/1988  . Smokeless tobacco: Never Used     Comment: Stopped over 20 years ago.  . Alcohol Use: No  . Drug Use: Not on file  . Sexual Activity: Not on file   Other Topics Concern  . Not on file   Social History Narrative  . No narrative on file    Family History  Problem Relation Age of Onset  . Alzheimer's disease Mother   . Stroke Father     Past Medical History  Diagnosis Date  . CAD (coronary artery disease)   . HTN (hypertension)   . Hyperlipidemia   . Prostate cancer   . NSTEMI (non-ST elevated myocardial infarction) December 2011  . Hx of CABG     December,  2011, Cornelius Moras  . Ejection fraction     EF 60% by history  . Prostate cancer     Radical prostatectomy approximately 2008  . Neck pain     Left neck pain, musculoskeletal, March, 2013  . Murmur     2/6 crescendo decrescendo systolic murmur  . Abnormal Doppler ultrasound of carotid artery   . Doppler ultrasound of carotid artery     Past Surgical History  Procedure Laterality Date  . Coronary artery bypass graft  December 2011    LIMA-LAD, L Radial-OM2, SVG-DX, SVG-PD  . Prostatectomy      Patient Active Problem List   Diagnosis Date Noted  . Doppler ultrasound of carotid artery   . Hx of CABG   . Prostate cancer   . Neck pain   . Murmur   . Ejection fraction   . CAD (coronary artery disease)   . HTN (hypertension)   . Hyperlipidemia     ROS   Patient denies fever, chills, headache, sweats, rash, change in vision, change in hearing, chest pain, cough, nausea vomiting, urinary symptoms. All other systems are reviewed and are negative.  PHYSICAL EXAM  Patient is overweight. We discussed this. He is oriented to person time and place. Affect is normal. There is no jugulovenous distention. Lungs are clear. Respiratory effort is nonlabored. Cardiac exam her vitals S1 and S2. There is a 2/6 systolic murmur that we've heard in the past. The abdomen is soft. There is no peripheral edema.  Filed Vitals:   08/29/13 1248  BP: 171/92  Pulse: 55  Height: 5\' 8"  (1.727 m)  Weight: 232 lb (105.235 kg)   EKG is done today and reviewed by me. There is sinus bradycardia that is mild. There are nonspecific ST-T wave changes.  ASSESSMENT & PLAN

## 2013-08-29 NOTE — Patient Instructions (Signed)

## 2013-08-29 NOTE — Assessment & Plan Note (Signed)
Coronary disease is stable. He underwent CABG in 2011. He does not need any exercise testing at this time. He has been active.  As part of today's evaluation I spent greater than 25 minutes with his total care. More than half of this time has been spent reviewing his history with him and reviewing other data with him.

## 2013-08-29 NOTE — Assessment & Plan Note (Signed)
The patient has a 2/6 systolic murmur. I reviewed his cath report. There was no gradient at pullback. When I see him next year we will consider 2-D echo. No further workup.

## 2013-08-29 NOTE — Assessment & Plan Note (Signed)
I review the old records to find his carotid Doppler done at the time of CABG. There was no significant stenosis. He does not need a followup Doppler at this time.

## 2013-08-29 NOTE — Assessment & Plan Note (Signed)
Patient's blood pressure is elevated today. It was normal and his primary care office recently. He is very concerned because he is missing something at home and needs to get back to look for it further.

## 2013-08-29 NOTE — Assessment & Plan Note (Signed)
Patient has mild sinus bradycardia while taking a beta blocker. He has no symptoms. No change in therapy.

## 2013-08-29 NOTE — Assessment & Plan Note (Signed)
The patient is on guidelines directed medical therapy for his lipids. No change in therapy.

## 2013-09-29 ENCOUNTER — Other Ambulatory Visit: Payer: Self-pay | Admitting: Cardiology

## 2013-10-29 ENCOUNTER — Other Ambulatory Visit (HOSPITAL_COMMUNITY): Payer: Self-pay | Admitting: Orthopedic Surgery

## 2013-10-29 DIAGNOSIS — M25562 Pain in left knee: Secondary | ICD-10-CM

## 2013-11-01 ENCOUNTER — Ambulatory Visit (HOSPITAL_COMMUNITY)
Admission: RE | Admit: 2013-11-01 | Discharge: 2013-11-01 | Disposition: A | Discharge status: Home / Self Care | Payer: Federal, State, Local not specified - PPO | Source: Ambulatory Visit | Attending: Orthopedic Surgery | Admitting: Orthopedic Surgery

## 2013-11-01 DIAGNOSIS — M25569 Pain in unspecified knee: Secondary | ICD-10-CM | POA: Insufficient documentation

## 2013-11-01 DIAGNOSIS — M25562 Pain in left knee: Secondary | ICD-10-CM

## 2013-11-01 DIAGNOSIS — M23329 Other meniscus derangements, posterior horn of medial meniscus, unspecified knee: Secondary | ICD-10-CM | POA: Insufficient documentation

## 2013-11-26 ENCOUNTER — Other Ambulatory Visit: Payer: Self-pay | Admitting: Orthopedic Surgery

## 2013-11-29 ENCOUNTER — Ambulatory Visit (INDEPENDENT_AMBULATORY_CARE_PROVIDER_SITE_OTHER): Payer: Federal, State, Local not specified - PPO | Admitting: Urology

## 2013-11-29 DIAGNOSIS — Z8546 Personal history of malignant neoplasm of prostate: Secondary | ICD-10-CM

## 2013-11-29 DIAGNOSIS — N529 Male erectile dysfunction, unspecified: Secondary | ICD-10-CM

## 2013-11-29 DIAGNOSIS — N393 Stress incontinence (female) (male): Secondary | ICD-10-CM

## 2013-12-11 ENCOUNTER — Encounter (HOSPITAL_COMMUNITY): Payer: Self-pay | Admitting: Pharmacy Technician

## 2013-12-13 ENCOUNTER — Other Ambulatory Visit: Payer: Self-pay | Admitting: Internal Medicine

## 2013-12-16 ENCOUNTER — Ambulatory Visit (HOSPITAL_COMMUNITY)
Admission: RE | Admit: 2013-12-16 | Discharge: 2013-12-16 | Disposition: A | Discharge status: Home / Self Care | Payer: Federal, State, Local not specified - PPO | Source: Ambulatory Visit | Attending: Orthopedic Surgery | Admitting: Orthopedic Surgery

## 2013-12-16 ENCOUNTER — Encounter (HOSPITAL_COMMUNITY)
Admission: RE | Admit: 2013-12-16 | Discharge: 2013-12-16 | Disposition: A | Discharge status: Home / Self Care | Payer: Federal, State, Local not specified - PPO | Source: Ambulatory Visit | Attending: Orthopedic Surgery | Admitting: Orthopedic Surgery

## 2013-12-16 ENCOUNTER — Encounter (HOSPITAL_COMMUNITY): Payer: Self-pay

## 2013-12-16 DIAGNOSIS — Z951 Presence of aortocoronary bypass graft: Secondary | ICD-10-CM | POA: Insufficient documentation

## 2013-12-16 DIAGNOSIS — I10 Essential (primary) hypertension: Secondary | ICD-10-CM | POA: Insufficient documentation

## 2013-12-16 DIAGNOSIS — I2510 Atherosclerotic heart disease of native coronary artery without angina pectoris: Secondary | ICD-10-CM | POA: Insufficient documentation

## 2013-12-16 HISTORY — DX: Personal history of urinary calculi: Z87.442

## 2013-12-16 LAB — BASIC METABOLIC PANEL
BUN: 17 mg/dL (ref 6–23)
CHLORIDE: 101 meq/L (ref 96–112)
CO2: 28 mEq/L (ref 19–32)
Calcium: 9.5 mg/dL (ref 8.4–10.5)
Creatinine, Ser: 1.08 mg/dL (ref 0.50–1.35)
GFR calc non Af Amer: 71 mL/min — ABNORMAL LOW (ref >90)
GFR, EST AFRICAN AMERICAN: 82 mL/min — AB (ref >90)
Glucose, Bld: 99 mg/dL (ref 70–99)
Potassium: 5 mEq/L (ref 3.7–5.3)
SODIUM: 139 meq/L (ref 137–147)

## 2013-12-16 LAB — CBC
HEMATOCRIT: 44.7 % (ref 39.0–52.0)
Hemoglobin: 15.5 g/dL (ref 13.0–17.0)
MCH: 31 pg (ref 26.0–34.0)
MCHC: 34.7 g/dL (ref 30.0–36.0)
MCV: 89.4 fL (ref 78.0–100.0)
Platelets: 173 10*3/uL (ref 150–400)
RBC: 5 MIL/uL (ref 4.22–5.81)
RDW: 12.3 % (ref 11.5–15.5)
WBC: 10.1 10*3/uL (ref 4.0–10.5)

## 2013-12-16 NOTE — Progress Notes (Signed)
12/16/13 1130  OBSTRUCTIVE SLEEP APNEA  Have you ever been diagnosed with sleep apnea through a sleep study? No  Do you snore loudly (loud enough to be heard through closed doors)?  1  Do you often feel tired, fatigued, or sleepy during the daytime? 0  Has anyone observed you stop breathing during your sleep? 0  Do you have, or are you being treated for high blood pressure? 1  BMI more than 35 kg/m2? 1  Age over 64 years old? 1  Neck circumference greater than 40 cm/18 inches? 0  Gender: 1  Obstructive Sleep Apnea Score 5  Score 4 or greater  Results sent to PCP

## 2013-12-16 NOTE — Pre-Procedure Instructions (Signed)
EKG AND CARDIOLOGIST OFFICE NOTE IN EPIC FROM DR. KATZ 08/29/13. CXR WAS DONE TODAY PREOP - AT [facility].

## 2013-12-16 NOTE — Patient Instructions (Signed)
YOUR SURGERY IS SCHEDULED AT [facility]  ON:  Wednesday  1/28  REPORT TO  SHORT STAY CENTER AT:  11:00 AM      PHONE # FOR SHORT STAY IS [phone]  DO NOT EAT ANYTHING AFTER MIDNIGHT THE NIGHT BEFORE YOUR SURGERY.  YOU MAY BRUSH YOUR TEETH, RINSE OUT YOUR MOUTH--BUT  NO FOOD, NO CHEWING GUM, NO MINTS, NO CANDIES, NO CHEWING TOBACCO.  YOU MAY HAVE CLEAR LIQUIDS TO DRINK FROM MIDNIGHT UNTIL 7:30 AM DAY OF SURGERY - LIKE WATER, SODA.  NOTHING TO DRINK AFTER 7:30 AM DAY OF SURGERY.  PLEASE TAKE THE FOLLOWING MEDICATIONS THE AM OF YOUR SURGERY WITH A FEW SIPS OF WATER:  ATORVASTATIN, METOPROLOL.    DO NOT BRING VALUABLES, MONEY, CREDIT CARDS.  DO NOT WEAR JEWELRY, MAKE-UP, NAIL POLISH AND NO METAL PINS OR CLIPS IN YOUR HAIR. CONTACT LENS, DENTURES / PARTIALS, GLASSES SHOULD NOT BE WORN TO SURGERY AND IN MOST CASES-HEARING AIDS WILL NEED TO BE REMOVED.  BRING YOUR GLASSES CASE, ANY EQUIPMENT NEEDED FOR YOUR CONTACT LENS. FOR PATIENTS ADMITTED TO THE HOSPITAL--CHECK OUT TIME THE DAY OF DISCHARGE IS 11:00 AM.  ALL INPATIENT ROOMS ARE PRIVATE - WITH BATHROOM, TELEPHONE, TELEVISION AND WIFI INTERNET.  IF YOU ARE BEING DISCHARGED THE SAME DAY OF YOUR SURGERY--YOU CAN NOT DRIVE YOURSELF HOME--AND SHOULD NOT GO HOME ALONE BY TAXI OR BUS.  NO DRIVING OR OPERATING MACHINERY, DO NOT MAKE LEGAL DECISIONS FOR 24 HOURS FOLLOWING ANESTHESIA / PAIN MEDICATIONS.  PLEASE MAKE ARRANGEMENTS FOR SOMEONE TO BE WITH YOU AT HOME THE FIRST 24 HOURS AFTER SURGERY. RESPONSIBLE DRIVER'S NAME / PHONE  PT'S WIFE ARLENE  [phone]                                                   PLEASE READ OVER ANY  FACT SHEETS THAT YOU WERE GIVEN: INCENTIVE SPIROMETER INFORMATION.  FAILURE TO FOLLOW THESE INSTRUCTIONS MAY RESULT IN THE CANCELLATION OF YOUR SURGERY. PLEASE BE AWARE THAT YOU MAY NEED ADDITIONAL BLOOD DRAWN DAY OF YOUR SURGERY  PATIENT SIGNATURE_________________________________

## 2013-12-17 DIAGNOSIS — S83249A Other tear of medial meniscus, current injury, unspecified knee, initial encounter: Secondary | ICD-10-CM | POA: Diagnosis present

## 2013-12-17 NOTE — H&P (Signed)
CC- Ralph [name] is a 63 y.o. male who presents with left knee pain.  HPI- . Knee Pain: Patient presents with knee pain involving the  left knee. Onset of the symptoms was several months ago. Inciting event: none known. Current symptoms include giving out, pain located medially and stiffness. Pain is aggravated by going up and down stairs, lateral movements, pivoting, rising after sitting and squatting.  Patient has had no prior knee problems. Evaluation to date: MRI: abnormal medial meniscal tear. Treatment to date: corticosteroid injection which was not very effective.  Past Medical History  Diagnosis Date  . CAD (coronary artery disease)   . HTN (hypertension)   . Hyperlipidemia   . Prostate cancer   . NSTEMI (non-ST elevated myocardial infarction) December 2011  . Hx of CABG     December, 2011, Ralph [name]  . Ejection fraction     EF 60% by history  . Prostate cancer     Radical prostatectomy approximately 2008  . Neck pain     Left neck pain, musculoskeletal, March, 2013  . Murmur     2/6 crescendo decrescendo systolic murmur  . Abnormal Doppler ultrasound of carotid artery   . Doppler ultrasound of carotid artery   . History of kidney stones   . Pain     LEFT KNEE - MENISCAL TEAR    Past Surgical History  Procedure Laterality Date  . Coronary artery bypass graft  December 2011    LIMA-LAD, L Radial-OM2, SVG-DX, SVG-PD  . Prostatectomy  2008  . Lithotripsy      X 2    Prior to Admission medications   Medication Sig Start Date End Date Taking? Authorizing Provider  aspirin 81 MG tablet Take 81 mg by mouth daily.    Historical Provider, MD  atorvastatin (LIPITOR) 40 MG tablet Take 40 mg by mouth every morning.    Historical Provider, MD  glucosamine-chondroitin 500-400 MG tablet Take 2 tablets by mouth daily.     Historical Provider, MD  lisinopril (PRINIVIL,ZESTRIL) 5 MG tablet Take 5 mg by mouth every morning.    Historical Provider, MD  lisinopril (PRINIVIL,ZESTRIL) 5  MG tablet TAKE 1 TABLET BY MOUTH DAILY. 12/13/13   [name], MD  Lysine 500 MG TABS Take 1 tablet by mouth daily.    Historical Provider, MD  metoprolol (LOPRESSOR) 50 MG tablet Take 50 mg by mouth 2 (two) times daily.      Historical Provider, MD  naproxen sodium (ANAPROX) 220 MG tablet Take 220 mg by mouth 2 (two) times daily with a meal.    Historical Provider, MD  Omega-3 Fatty Acids (FISH OIL TRIPLE STRENGTH) 1400 MG CAPS Take 1 capsule by mouth daily.    Historical Provider, MD   KNEE EXAM antalgic gait, soft tissue tenderness over medial joint line,no effusion, negative pivot-shift, collateral ligaments intact  Physical Examination: General appearance - alert, well appearing, and in no distress Mental status - alert, oriented to person, place, and time Chest - clear to auscultation, no wheezes, rales or rhonchi, symmetric air entry Heart - normal rate, regular rhythm, normal S1, S2, no murmurs, rubs, clicks or gallops Abdomen - soft, nontender, nondistended, no masses or organomegaly Neurological - alert, oriented, normal speech, no focal findings or movement disorder noted   Asessment/Plan--- Left knee medial meniscal tear- - Plan left knee arthroscopy with meniscal debridement. Procedure risks and potential comps discussed with patient who elects to proceed. Goals are decreased pain and increased function with a  high likelihood of achieving both

## 2013-12-18 ENCOUNTER — Ambulatory Visit (HOSPITAL_COMMUNITY): Payer: Federal, State, Local not specified - PPO | Admitting: Certified Registered Nurse Anesthetist

## 2013-12-18 ENCOUNTER — Encounter (HOSPITAL_COMMUNITY): Payer: Federal, State, Local not specified - PPO | Admitting: Certified Registered Nurse Anesthetist

## 2013-12-18 ENCOUNTER — Encounter (HOSPITAL_COMMUNITY): Payer: Self-pay | Admitting: *Deleted

## 2013-12-18 ENCOUNTER — Ambulatory Visit (HOSPITAL_COMMUNITY)
Admission: RE | Admit: 2013-12-18 | Discharge: 2013-12-18 | Disposition: A | Discharge status: Home / Self Care | Payer: Federal, State, Local not specified - PPO | Source: Ambulatory Visit | Attending: Orthopedic Surgery | Admitting: Orthopedic Surgery

## 2013-12-18 ENCOUNTER — Encounter (HOSPITAL_COMMUNITY)
Admission: RE | Disposition: A | Discharge status: Home / Self Care | Payer: Self-pay | Source: Ambulatory Visit | Attending: Orthopedic Surgery

## 2013-12-18 DIAGNOSIS — Z951 Presence of aortocoronary bypass graft: Secondary | ICD-10-CM | POA: Insufficient documentation

## 2013-12-18 DIAGNOSIS — S83249A Other tear of medial meniscus, current injury, unspecified knee, initial encounter: Secondary | ICD-10-CM | POA: Diagnosis present

## 2013-12-18 DIAGNOSIS — Z9079 Acquired absence of other genital organ(s): Secondary | ICD-10-CM | POA: Insufficient documentation

## 2013-12-18 DIAGNOSIS — Z7982 Long term (current) use of aspirin: Secondary | ICD-10-CM | POA: Insufficient documentation

## 2013-12-18 DIAGNOSIS — I252 Old myocardial infarction: Secondary | ICD-10-CM | POA: Insufficient documentation

## 2013-12-18 DIAGNOSIS — Z87891 Personal history of nicotine dependence: Secondary | ICD-10-CM | POA: Insufficient documentation

## 2013-12-18 DIAGNOSIS — Z79899 Other long term (current) drug therapy: Secondary | ICD-10-CM | POA: Insufficient documentation

## 2013-12-18 DIAGNOSIS — C61 Malignant neoplasm of prostate: Secondary | ICD-10-CM | POA: Insufficient documentation

## 2013-12-18 DIAGNOSIS — M2240 Chondromalacia patellae, unspecified knee: Secondary | ICD-10-CM | POA: Insufficient documentation

## 2013-12-18 DIAGNOSIS — X58XXXA Exposure to other specified factors, initial encounter: Secondary | ICD-10-CM | POA: Insufficient documentation

## 2013-12-18 DIAGNOSIS — E785 Hyperlipidemia, unspecified: Secondary | ICD-10-CM | POA: Insufficient documentation

## 2013-12-18 DIAGNOSIS — I2510 Atherosclerotic heart disease of native coronary artery without angina pectoris: Secondary | ICD-10-CM | POA: Insufficient documentation

## 2013-12-18 DIAGNOSIS — I10 Essential (primary) hypertension: Secondary | ICD-10-CM | POA: Insufficient documentation

## 2013-12-18 DIAGNOSIS — IMO0002 Reserved for concepts with insufficient information to code with codable children: Secondary | ICD-10-CM | POA: Insufficient documentation

## 2013-12-18 DIAGNOSIS — R011 Cardiac murmur, unspecified: Secondary | ICD-10-CM | POA: Insufficient documentation

## 2013-12-18 HISTORY — PX: KNEE ARTHROSCOPY: SHX127

## 2013-12-18 SURGERY — ARTHROSCOPY, KNEE
Anesthesia: General | Site: Knee | Laterality: Left

## 2013-12-18 MED ORDER — FENTANYL CITRATE 0.05 MG/ML IJ SOLN
INTRAMUSCULAR | Status: AC
  Filled 2013-12-18: qty 2

## 2013-12-18 MED ORDER — LIDOCAINE HCL (CARDIAC) 20 MG/ML IV SOLN
INTRAVENOUS | Status: AC
  Filled 2013-12-18: qty 5

## 2013-12-18 MED ORDER — MIDAZOLAM HCL 5 MG/5ML IJ SOLN
INTRAMUSCULAR | Status: DC | PRN
  Administered 2013-12-18: 2 mg via INTRAVENOUS

## 2013-12-18 MED ORDER — METHOCARBAMOL 500 MG PO TABS: 500.0000 mg | ORAL_TABLET | Freq: Four times a day (QID) | ORAL | Status: DC

## 2013-12-18 MED ORDER — HYDROMORPHONE HCL PF 1 MG/ML IJ SOLN
INTRAMUSCULAR | Status: AC
  Filled 2013-12-18: qty 1

## 2013-12-18 MED ORDER — DEXAMETHASONE SODIUM PHOSPHATE 10 MG/ML IJ SOLN
10.0000 mg | Freq: Once | INTRAMUSCULAR | Status: AC
  Administered 2013-12-18: 10 mg via INTRAVENOUS

## 2013-12-18 MED ORDER — ACETAMINOPHEN 10 MG/ML IV SOLN
1000.0000 mg | Freq: Once | INTRAVENOUS | Status: AC
  Administered 2013-12-18: 1000 mg via INTRAVENOUS
  Filled 2013-12-18: qty 100

## 2013-12-18 MED ORDER — FENTANYL CITRATE 0.05 MG/ML IJ SOLN
INTRAMUSCULAR | Status: DC | PRN
  Administered 2013-12-18 (×4): 50 ug via INTRAVENOUS

## 2013-12-18 MED ORDER — PROMETHAZINE HCL 25 MG/ML IJ SOLN: 6.2500 mg | INTRAMUSCULAR | Status: DC | PRN

## 2013-12-18 MED ORDER — LACTATED RINGERS IV SOLN: INTRAVENOUS | Status: DC

## 2013-12-18 MED ORDER — CEFAZOLIN SODIUM-DEXTROSE 2-3 GM-% IV SOLR
2.0000 g | INTRAVENOUS | Status: AC
Start: 2013-12-18 — End: 2013-12-18
  Administered 2013-12-18: 2 g via INTRAVENOUS

## 2013-12-18 MED ORDER — SODIUM CHLORIDE 0.9 % IV SOLN: INTRAVENOUS | Status: DC

## 2013-12-18 MED ORDER — ONDANSETRON HCL 4 MG/2ML IJ SOLN
INTRAMUSCULAR | Status: AC
  Filled 2013-12-18: qty 2

## 2013-12-18 MED ORDER — MIDAZOLAM HCL 2 MG/2ML IJ SOLN
INTRAMUSCULAR | Status: AC
  Filled 2013-12-18: qty 2

## 2013-12-18 MED ORDER — DEXAMETHASONE SODIUM PHOSPHATE 10 MG/ML IJ SOLN
INTRAMUSCULAR | Status: AC
  Filled 2013-12-18: qty 1

## 2013-12-18 MED ORDER — LIDOCAINE HCL (CARDIAC) 20 MG/ML IV SOLN
INTRAVENOUS | Status: DC | PRN
  Administered 2013-12-18: 100 mg via INTRAVENOUS

## 2013-12-18 MED ORDER — HYDROCODONE-ACETAMINOPHEN 5-325 MG PO TABS: 1.0000 | ORAL_TABLET | Freq: Four times a day (QID) | ORAL | Status: DC | PRN

## 2013-12-18 MED ORDER — BUPIVACAINE-EPINEPHRINE 0.25% -1:200000 IJ SOLN
INTRAMUSCULAR | Status: DC | PRN
  Administered 2013-12-18: 20 mL

## 2013-12-18 MED ORDER — BUPIVACAINE-EPINEPHRINE PF 0.25-1:200000 % IJ SOLN
INTRAMUSCULAR | Status: AC
  Filled 2013-12-18: qty 30

## 2013-12-18 MED ORDER — SODIUM CHLORIDE 0.9 % IR SOLN
Status: DC | PRN
  Administered 2013-12-18: 3000 mL

## 2013-12-18 MED ORDER — CHLORHEXIDINE GLUCONATE 4 % EX LIQD: 60.0000 mL | Freq: Once | CUTANEOUS | Status: DC

## 2013-12-18 MED ORDER — PROPOFOL 10 MG/ML IV BOLUS
INTRAVENOUS | Status: AC
  Filled 2013-12-18: qty 20

## 2013-12-18 MED ORDER — CEFAZOLIN SODIUM-DEXTROSE 2-3 GM-% IV SOLR
INTRAVENOUS | Status: AC
  Filled 2013-12-18: qty 50

## 2013-12-18 MED ORDER — HYDROMORPHONE HCL PF 1 MG/ML IJ SOLN
0.2500 mg | INTRAMUSCULAR | Status: DC | PRN
  Administered 2013-12-18 (×2): 0.5 mg via INTRAVENOUS

## 2013-12-18 MED ORDER — LACTATED RINGERS IV SOLN
INTRAVENOUS | Status: DC | PRN
  Administered 2013-12-18: 13:00:00 via INTRAVENOUS

## 2013-12-18 MED ORDER — ONDANSETRON HCL 4 MG/2ML IJ SOLN
INTRAMUSCULAR | Status: DC | PRN
  Administered 2013-12-18: 4 mg via INTRAVENOUS

## 2013-12-18 MED ORDER — PROPOFOL 10 MG/ML IV BOLUS
INTRAVENOUS | Status: DC | PRN
  Administered 2013-12-18: 200 mg via INTRAVENOUS

## 2013-12-18 SURGICAL SUPPLY — 29 items
BANDAGE ELASTIC 6 VELCRO ST LF (GAUZE/BANDAGES/DRESSINGS) ×3 IMPLANT
BLADE 4.2CUDA (BLADE) ×3 IMPLANT
CLOTH BEACON ORANGE TIMEOUT ST (SAFETY) ×3 IMPLANT
COUNTER NEEDLE 20 DBL MAG RED (NEEDLE) ×3 IMPLANT
CUFF TOURN SGL QUICK 34 (TOURNIQUET CUFF) ×3
CUFF TRNQT CYL 34X4X40X1 (TOURNIQUET CUFF) ×1 IMPLANT
DRAPE U-SHAPE 47X51 STRL (DRAPES) ×3 IMPLANT
DRSG ADAPTIC 3X8 NADH LF (GAUZE/BANDAGES/DRESSINGS) ×3 IMPLANT
DRSG EMULSION OIL 3X3 NADH (GAUZE/BANDAGES/DRESSINGS) ×3 IMPLANT
DRSG PAD ABDOMINAL 8X10 ST (GAUZE/BANDAGES/DRESSINGS) ×3 IMPLANT
DURAPREP 26ML APPLICATOR (WOUND CARE) ×3 IMPLANT
GLOVE BIO SURGEON STRL SZ8 (GLOVE) ×3 IMPLANT
GLOVE BIOGEL PI IND STRL 8 (GLOVE) ×1 IMPLANT
GLOVE BIOGEL PI INDICATOR 8 (GLOVE) ×2
GOWN STRL REUS W/TWL LRG LVL3 (GOWN DISPOSABLE) ×3 IMPLANT
MANIFOLD NEPTUNE II (INSTRUMENTS) ×3 IMPLANT
PACK ARTHROSCOPY WL (CUSTOM PROCEDURE TRAY) ×3 IMPLANT
PACK ICE MAXI GEL EZY WRAP (MISCELLANEOUS) ×9 IMPLANT
PADDING CAST ABS 6INX4YD NS (CAST SUPPLIES) ×2
PADDING CAST ABS COTTON 6X4 NS (CAST SUPPLIES) ×1 IMPLANT
PADDING CAST COTTON 6X4 STRL (CAST SUPPLIES) ×6 IMPLANT
POSITIONER SURGICAL ARM (MISCELLANEOUS) IMPLANT
SET ARTHROSCOPY TUBING (MISCELLANEOUS) ×2
SET ARTHROSCOPY TUBING LN (MISCELLANEOUS) ×1 IMPLANT
SPONGE GAUZE 4X4 12PLY (GAUZE/BANDAGES/DRESSINGS) ×3 IMPLANT
SUT ETHILON 4 0 PS 2 18 (SUTURE) ×3 IMPLANT
TOWEL OR 17X26 10 PK STRL BLUE (TOWEL DISPOSABLE) ×3 IMPLANT
WAND 90 DEG TURBOVAC W/CORD (SURGICAL WAND) IMPLANT
WRAP KNEE MAXI GEL POST OP (GAUZE/BANDAGES/DRESSINGS) ×3 IMPLANT

## 2013-12-18 NOTE — Discharge Instructions (Signed)

## 2013-12-18 NOTE — Anesthesia Postprocedure Evaluation (Signed)
Anesthesia Post Note  Patient: Ralph [name]  Procedure(s) Performed: Procedure(s) (LRB): LEFT ARTHROSCOPY KNEE WITH DEBRIDMENT medial and lateral menisectomy condroplasty (Left)  Anesthesia type: General  Patient location: PACU  Post pain: Pain level controlled  Post assessment: Post-op Vital signs reviewed  Last Vitals:  Filed Vitals:   12/18/13 1640  BP: 151/73  Pulse: 73  Temp: 36.4 C  Resp: 16    Post vital signs: Reviewed  Level of consciousness: sedated  Complications: No apparent anesthesia complications

## 2013-12-18 NOTE — Anesthesia Preprocedure Evaluation (Signed)
Anesthesia Evaluation  Patient identified by MRN, date of birth, ID band Patient awake    Reviewed: Allergy & Precautions, H&P , NPO status , Patient's Chart, lab work & pertinent test results  Airway Mallampati: III TM Distance: >3 FB Neck ROM: Full    Dental  (+) Teeth Intact, Caps and Dental Advisory Given,    Pulmonary neg pulmonary ROS, former smoker,  breath sounds clear to auscultation        Cardiovascular hypertension, Pt. on medications and Pt. on home beta blockers + CAD, + Past MI and + CABG negative cardio ROS  + dysrhythmias Rhythm:Regular Rate:Normal     Neuro/Psych negative neurological ROS  negative psych ROS   GI/Hepatic negative GI ROS, Neg liver ROS,   Endo/Other  Morbid obesity  Renal/GU negative Renal ROS  negative genitourinary   Musculoskeletal negative musculoskeletal ROS (+)   Abdominal   Peds negative pediatric ROS (+)  Hematology negative hematology ROS (+)   Anesthesia Other Findings   Reproductive/Obstetrics                           Anesthesia Physical Anesthesia Plan  ASA: III  Anesthesia Plan: General   Post-op Pain Management:    Induction: Intravenous  Airway Management Planned: LMA  Additional Equipment:   Intra-op Plan:   Post-operative Plan: Extubation in OR  Informed Consent: I have reviewed the patients History and Physical, chart, labs and discussed the procedure including the risks, benefits and alternatives for the proposed anesthesia with the patient or authorized representative who has indicated his/her understanding and acceptance.   Dental advisory given  Plan Discussed with: CRNA  Anesthesia Plan Comments:         Anesthesia Quick Evaluation

## 2013-12-18 NOTE — Op Note (Signed)
Preoperative diagnosis-  Left knee medial meniscal tear  Postoperative diagnosis Left- knee medial meniscal tear  plus Left medial femoral chondral defect  Procedure- Left knee arthroscopy with medial meniscal debridement and chondroplasty   Surgeon- [name]. Penn Grissett, MD  Anesthesia-General  EBL-  minimal Complications- None  Condition- PACU - hemodynamically stable.  Brief clinical note- -Ralph [name] is a 63 y.o.  male with a several month history of left knee pain and mechanical symptoms. Exam and history suggested medial meniscal tear confirmed by MRI. The patient presents now for arthroscopy and debridement   Procedure in detail -       After successful administration of General anesthetic, a tourmiquet is placed high on the Left  thigh and the Left lower extremity is prepped and draped in the usual sterile fashion. Time out is performed by the surgical team. Standard superomedial and inferolateral portal sites are marked and incisions made with an 11 blade. The inflow cannula is passed through the superomedial portal and camera through the inferolateral portal and inflow is initiated. Arthroscopic visualization proceeds.      The undersurface of the patella and trochlea are visualized and they are normal. The medial and lateral gutters are visualized and there are  no loose bodies. Flexion and valgus force is applied to the knee and the medial compartment is entered. A spinal needle is passed into the joint through the site marked for the inferomedial portal. A small incision is made and the dilator passed into the joint. The findings for the medial compartment are unstable tear posterior horn medial meniscus and 1 x 2 cm chondral defect medial femoral condyle . The tear is debrided to a stable base with baskets and a shaver and sealed off with the Arthrocare. The shaver is used to debride the unstable cartilage to a stable bony base with stable edges. It is probed and found to be  stable.    The intercondylar notch is visualized and the ACL appears normal. The lateral compartment is entered and the findings are normal .       The joint is again inspected and there are no other tears, defects or loose bodies identified. The arthroscopic equipment is then removed from the inferior portals which are closed with interrupted 4-0 nylon. 20 ml of .25% Marcaine with epinephrine are injected through the inflow cannula and the cannula is then removed and the portal closed with nylon. The incisions are cleaned and dried and a bulky sterile dressing is applied. The patient is then awakened and transported to recovery in stable condition.   12/18/2013, 2:26 PM

## 2013-12-18 NOTE — Preoperative (Signed)
Beta Blockers   Reason not to administer Beta Blockers:Not Applicable Patient took Beta Blocker today 12-18-13 AM

## 2013-12-18 NOTE — Transfer of Care (Signed)
Immediate Anesthesia Transfer of Care Note  Patient: Ralph [name]  Procedure(s) Performed: Procedure(s) (LRB): LEFT ARTHROSCOPY KNEE WITH DEBRIDMENT (Left)  Patient Location: PACU  Anesthesia Type: General  Level of Consciousness: sedated, patient cooperative and responds to stimulation  Airway & Oxygen Therapy: Patient Spontanous Breathing and Patient connected to face mask oxgen  Post-op Assessment: Report given to PACU RN and Post -op Vital signs reviewed and stable  Post vital signs: Reviewed and stable  Complications: No apparent anesthesia complications

## 2013-12-19 ENCOUNTER — Encounter (HOSPITAL_COMMUNITY): Payer: Self-pay | Admitting: Orthopedic Surgery

## 2014-01-13 ENCOUNTER — Other Ambulatory Visit: Payer: Self-pay | Admitting: *Deleted

## 2014-01-13 MED ORDER — ATORVASTATIN CALCIUM 40 MG PO TABS: 40.0000 mg | ORAL_TABLET | Freq: Every morning | ORAL | Status: DC

## 2014-01-14 ENCOUNTER — Other Ambulatory Visit: Payer: Self-pay | Admitting: Cardiology

## 2014-01-14 MED ORDER — ATORVASTATIN CALCIUM 40 MG PO TABS: 40.0000 mg | ORAL_TABLET | Freq: Every morning | ORAL | Status: DC

## 2014-02-25 ENCOUNTER — Other Ambulatory Visit (HOSPITAL_COMMUNITY): Payer: Self-pay | Admitting: Orthopedic Surgery

## 2014-02-25 DIAGNOSIS — M545 Low back pain, unspecified: Secondary | ICD-10-CM

## 2014-02-27 ENCOUNTER — Ambulatory Visit (HOSPITAL_COMMUNITY)
Admission: RE | Admit: 2014-02-27 | Discharge: 2014-02-27 | Disposition: A | Discharge status: Home / Self Care | Payer: Federal, State, Local not specified - PPO | Source: Ambulatory Visit | Attending: Orthopedic Surgery | Admitting: Orthopedic Surgery

## 2014-02-27 DIAGNOSIS — M545 Low back pain, unspecified: Secondary | ICD-10-CM

## 2014-02-27 DIAGNOSIS — M48061 Spinal stenosis, lumbar region without neurogenic claudication: Secondary | ICD-10-CM | POA: Insufficient documentation

## 2014-02-27 DIAGNOSIS — M51379 Other intervertebral disc degeneration, lumbosacral region without mention of lumbar back pain or lower extremity pain: Secondary | ICD-10-CM | POA: Insufficient documentation

## 2014-02-27 DIAGNOSIS — M5137 Other intervertebral disc degeneration, lumbosacral region: Secondary | ICD-10-CM | POA: Insufficient documentation

## 2014-02-27 DIAGNOSIS — M47817 Spondylosis without myelopathy or radiculopathy, lumbosacral region: Secondary | ICD-10-CM | POA: Insufficient documentation

## 2014-04-18 ENCOUNTER — Ambulatory Visit (INDEPENDENT_AMBULATORY_CARE_PROVIDER_SITE_OTHER): Payer: Federal, State, Local not specified - PPO | Admitting: Urology

## 2014-04-18 DIAGNOSIS — N281 Cyst of kidney, acquired: Secondary | ICD-10-CM

## 2014-08-05 ENCOUNTER — Other Ambulatory Visit: Payer: Self-pay | Admitting: *Deleted

## 2014-08-05 MED ORDER — LISINOPRIL 5 MG PO TABS: ORAL_TABLET | ORAL | Status: DC

## 2014-09-08 ENCOUNTER — Ambulatory Visit (INDEPENDENT_AMBULATORY_CARE_PROVIDER_SITE_OTHER): Payer: Federal, State, Local not specified - PPO | Admitting: Cardiology

## 2014-09-08 ENCOUNTER — Encounter: Payer: Self-pay | Admitting: Cardiology

## 2014-09-08 VITALS — BP 145/85 | HR 51 | Ht 68.0 in | Wt 233.1 lb

## 2014-09-08 DIAGNOSIS — I2510 Atherosclerotic heart disease of native coronary artery without angina pectoris: Secondary | ICD-10-CM

## 2014-09-08 DIAGNOSIS — R938 Abnormal findings on diagnostic imaging of other specified body structures: Secondary | ICD-10-CM

## 2014-09-08 DIAGNOSIS — R9389 Abnormal findings on diagnostic imaging of other specified body structures: Secondary | ICD-10-CM

## 2014-09-08 DIAGNOSIS — I10 Essential (primary) hypertension: Secondary | ICD-10-CM

## 2014-09-08 DIAGNOSIS — R011 Cardiac murmur, unspecified: Secondary | ICD-10-CM

## 2014-09-08 DIAGNOSIS — E785 Hyperlipidemia, unspecified: Secondary | ICD-10-CM

## 2014-09-08 NOTE — Assessment & Plan Note (Signed)
Patient is on medications for his blood pressure. It is controlled.

## 2014-09-08 NOTE — Assessment & Plan Note (Signed)
The patient has a systolic murmur. It is time to proceed with a followup echo to reassess his valves and his left ventricular function. Two-dimensional echo will be arranged.

## 2014-09-08 NOTE — Progress Notes (Signed)
Patient ID: Ralph [name], male   DOB: 09-04-1951, 63 y.o.   MRN: [id number]    HPI  The patient is seen to followup coronary disease. I saw him last in October, 2014. He underwent bypass surgery in 2011. He had good left ventricular function at that time. He has not had an echo since his surgery. He had carotid Dopplers at the time of his surgery that showed no significant disease. He has not had a carotid Doppler since his surgery. He's not having any chest pain or shortness of breath. He does have some difficulties with his knees.  No Known Allergies  Current Outpatient Prescriptions  Medication Sig Dispense Refill  . aspirin 81 MG tablet Take 81 mg by mouth daily.      Marland Kitchen atorvastatin (LIPITOR) 40 MG tablet Take 1 tablet (40 mg total) by mouth every morning.  30 tablet  6  . lisinopril (PRINIVIL,ZESTRIL) 5 MG tablet Take 5 mg by mouth every morning.      . metoprolol (LOPRESSOR) 50 MG tablet Take 50 mg by mouth 2 (two) times daily.        . Omega-3 Fatty Acids (FISH OIL TRIPLE STRENGTH) 1400 MG CAPS Take 1 capsule by mouth daily.       No current facility-administered medications for this visit.    History   Social History  . Marital Status: Married    Spouse Name: N/A    Number of Children: N/A  . Years of Education: N/A   Occupational History  . Not on file.   Social History Main Topics  . Smoking status: Former Smoker -- 0.80 packs/day for 10 years    Types: Cigarettes    Quit date: 11/21/1988  . Smokeless tobacco: Never Used     Comment: Stopped over 20 years ago.  . Alcohol Use: No  . Drug Use: No  . Sexual Activity: Not on file   Other Topics Concern  . Not on file   Social History Narrative  . No narrative on file    Family History  Problem Relation Age of Onset  . Alzheimer's disease Mother   . Stroke Father     Past Medical History  Diagnosis Date  . CAD (coronary artery disease)   . HTN (hypertension)   . Hyperlipidemia   . Prostate cancer   .  NSTEMI (non-ST elevated myocardial infarction) December 2011  . Hx of CABG     December, 2011, [name]  . Ejection fraction     EF 60% by history  . Prostate cancer     Radical prostatectomy approximately 2008  . Neck pain     Left neck pain, musculoskeletal, March, 2013  . Murmur     2/6 crescendo decrescendo systolic murmur  . Abnormal Doppler ultrasound of carotid artery   . Doppler ultrasound of carotid artery   . History of kidney stones   . Pain     LEFT KNEE - MENISCAL TEAR    Past Surgical History  Procedure Laterality Date  . Coronary artery bypass graft  December 2011    LIMA-LAD, L Radial-OM2, SVG-DX, SVG-PD  . Prostatectomy  2008  . Lithotripsy      X 2  . Knee arthroscopy Left 12/18/2013    Procedure: LEFT ARTHROSCOPY KNEE WITH DEBRIDMENT medial and lateral menisectomy condroplasty;  Surgeon: Gearlean Alf, MD;  Location: [facility];  Service: Orthopedics;  Laterality: Left;    Patient Active Problem List   Diagnosis Date Noted  .  Acute medial meniscal tear 12/17/2013  . Sinus bradycardia 08/29/2013  . Doppler ultrasound of carotid artery   . Hx of CABG   . Prostate cancer   . Neck pain   . Murmur   . Ejection fraction   . CAD (coronary artery disease)   . HTN (hypertension)   . Hyperlipidemia     ROS   Patient denies fever, chills, headache, sweats, rash, change in vision, change in hearing, chest pain, cough, nausea vomiting, urinary symptoms. All other systems are reviewed and are negative.  PHYSICAL EXAM  Patient is oriented to person time and place. Affect is normal. Head is atraumatic. Sclera and conjunctiva are normal. There is no jugulovenous distention. Lungs are clear. Respiratory effort is nonlabored. Cardiac exam reveals S1 and S2. There is a 2/6 crescendo decrescendo systolic murmur. The abdomen is soft. Is no peripheral edema.  Filed Vitals:   09/08/14 0758  BP: 145/85  Pulse: 51  Height: 5\' 8"  (1.727 m)  Weight: 233 lb 1.9 oz (105.743 kg)    SpO2: 98%    EKG   EKG is done today and reviewed by me. There is sinus bradycardia. There are no significant changes.  ASSESSMENT & PLAN

## 2014-09-08 NOTE — Assessment & Plan Note (Signed)
The patient is stable since surgery in 2011. Consideration can be given to exercise testing next year. His left ventricular function has not been assessed since his surgery. I'm recommending a 2-D echo at this time.

## 2014-09-08 NOTE — Assessment & Plan Note (Signed)
The patient had no significant disease at the time of a Doppler in 2011. He will be appropriate to consider followup Doppler at some point.

## 2014-09-08 NOTE — Patient Instructions (Signed)
Your physician has requested that you have an echocardiogram. Echocardiography is a painless test that uses sound waves to create images of your heart. It provides your doctor with information about the size and shape of your heart and how well your heart's chambers and valves are working. This procedure takes approximately one hour. There are no restrictions for this procedure. Office will contact with results via phone or letter.   Continue all current medications. Your physician wants you to follow up in:  1 year.  You will receive a reminder letter in the mail one-two months in advance.  If you don't receive a letter, please call our office to schedule the follow up appointment    

## 2014-09-08 NOTE — Assessment & Plan Note (Signed)
The patient is on guideline directed statin therapy for his lipids. This is followed by his primary physician. He is also on omega 3 fish oil that he started on his own. I made it clear to him that the decision about the fish oil is up to him and his primary physician.

## 2014-09-17 ENCOUNTER — Other Ambulatory Visit: Payer: Federal, State, Local not specified - PPO

## 2014-09-24 ENCOUNTER — Other Ambulatory Visit (INDEPENDENT_AMBULATORY_CARE_PROVIDER_SITE_OTHER): Payer: Federal, State, Local not specified - PPO

## 2014-09-24 ENCOUNTER — Other Ambulatory Visit: Payer: Self-pay

## 2014-09-24 DIAGNOSIS — I2510 Atherosclerotic heart disease of native coronary artery without angina pectoris: Secondary | ICD-10-CM

## 2014-09-24 DIAGNOSIS — R011 Cardiac murmur, unspecified: Secondary | ICD-10-CM

## 2014-09-25 ENCOUNTER — Telehealth: Payer: Self-pay | Admitting: *Deleted

## 2014-09-25 NOTE — Telephone Encounter (Signed)
-----   Message from [name], MD sent at 09/25/2014 11:25 AM EST ----- Please notify the patient that the echo looks quite good. I'm pleased with the result.

## 2014-09-25 NOTE — Telephone Encounter (Signed)
Patient informed. 

## 2014-12-12 ENCOUNTER — Ambulatory Visit: Payer: Federal, State, Local not specified - PPO | Admitting: Urology

## 2015-01-16 ENCOUNTER — Ambulatory Visit (INDEPENDENT_AMBULATORY_CARE_PROVIDER_SITE_OTHER): Payer: Federal, State, Local not specified - PPO | Admitting: Urology

## 2015-01-16 DIAGNOSIS — N5201 Erectile dysfunction due to arterial insufficiency: Secondary | ICD-10-CM | POA: Diagnosis not present

## 2015-01-16 DIAGNOSIS — Z87442 Personal history of urinary calculi: Secondary | ICD-10-CM | POA: Diagnosis not present

## 2015-01-16 DIAGNOSIS — Z8546 Personal history of malignant neoplasm of prostate: Secondary | ICD-10-CM

## 2015-01-21 ENCOUNTER — Other Ambulatory Visit: Payer: Self-pay | Admitting: Orthopedic Surgery

## 2015-01-21 DIAGNOSIS — M1712 Unilateral primary osteoarthritis, left knee: Secondary | ICD-10-CM

## 2015-01-23 ENCOUNTER — Ambulatory Visit
Admission: RE | Admit: 2015-01-23 | Discharge: 2015-01-23 | Disposition: A | Discharge status: Home / Self Care | Payer: Medicare Other | Source: Ambulatory Visit | Attending: Orthopedic Surgery | Admitting: Orthopedic Surgery

## 2015-01-23 DIAGNOSIS — M1712 Unilateral primary osteoarthritis, left knee: Secondary | ICD-10-CM

## 2015-01-28 ENCOUNTER — Telehealth: Payer: Self-pay | Admitting: *Deleted

## 2015-01-28 MED ORDER — ATORVASTATIN CALCIUM 40 MG PO TABS: 40.0000 mg | ORAL_TABLET | Freq: Every morning | ORAL | Status: DC

## 2015-01-28 NOTE — Telephone Encounter (Signed)
Refill request [facility] atorvastatin 40 mg. Medication sent to pharmacy.

## 2015-02-19 ENCOUNTER — Other Ambulatory Visit: Payer: Self-pay | Admitting: *Deleted

## 2015-02-19 MED ORDER — LISINOPRIL 5 MG PO TABS: 5.0000 mg | ORAL_TABLET | Freq: Every morning | ORAL | Status: DC

## 2015-03-12 ENCOUNTER — Encounter: Payer: Self-pay | Admitting: Cardiology

## 2015-03-12 ENCOUNTER — Ambulatory Visit (INDEPENDENT_AMBULATORY_CARE_PROVIDER_SITE_OTHER): Payer: Medicare Other | Admitting: Cardiology

## 2015-03-12 VITALS — BP 130/74 | HR 47 | Ht 68.0 in | Wt 216.1 lb

## 2015-03-12 DIAGNOSIS — R001 Bradycardia, unspecified: Secondary | ICD-10-CM

## 2015-03-12 DIAGNOSIS — I2510 Atherosclerotic heart disease of native coronary artery without angina pectoris: Secondary | ICD-10-CM

## 2015-03-12 DIAGNOSIS — I10 Essential (primary) hypertension: Secondary | ICD-10-CM

## 2015-03-12 DIAGNOSIS — Z01810 Encounter for preprocedural cardiovascular examination: Secondary | ICD-10-CM

## 2015-03-12 MED ORDER — METOPROLOL TARTRATE 25 MG PO TABS: 25.0000 mg | ORAL_TABLET | Freq: Two times a day (BID) | ORAL | Status: DC

## 2015-03-12 NOTE — Assessment & Plan Note (Signed)
The patient's cardiac status is completely stable. No further workup is needed. He is cleared for knee surgery to be done in July, 2016.

## 2015-03-12 NOTE — Patient Instructions (Addendum)
Your physician has recommended you make the following change in your medication:  Decrease your metoprolol tartrate to 25 mg twice daily. You may break your 50 mg tablet in half twice daily until they are finished. Continue all other medications the same. We will send your cardiac clearance information to your surgeon.(Dr. Wynelle Link) Your physician recommends that you schedule a follow-up appointment in: 1 year. You will receive a reminder letter in the mail in about 10 months reminding you to call and schedule your appointment. If you don't receive this letter, please contact our office.

## 2015-03-12 NOTE — Progress Notes (Signed)
Cardiology Office Note   Date:  03/12/2015   ID:  Ralph [name], DOB October 23, 1951, MRN [id number]  PCP:  Ralph Lair, MD  Cardiologist:  Ralph Argyle, MD   Chief Complaint  Patient presents with  . Appointment    Follow-up coronary disease and assess for preop clearance      History of Present Illness: Ralph [name] is a 64 y.o. male who presents today for follow-up of coronary disease and for preoperative clearance for knee procedure. He is doing well. His coronary status is stable. I saw him last October, 2015. An echo was done in November, 2015. He has normal left jugular function. He has aortic valve sclerosis but no stenosis. He's not been having any chest pain. There is no evidence of CHF. He has not had any arrhythmias. His exercise level is good. He needs knee surgery.    Past Medical History  Diagnosis Date  . CAD (coronary artery disease)   . HTN (hypertension)   . Hyperlipidemia   . Prostate cancer   . NSTEMI (non-ST elevated myocardial infarction) December 2011  . Hx of CABG     December, 2011, Ralph [name]  . Ejection fraction     EF 60% by history  . Prostate cancer     Radical prostatectomy approximately 2008  . Neck pain     Left neck pain, musculoskeletal, March, 2013  . Murmur     2/6 crescendo decrescendo systolic murmur  . Abnormal Doppler ultrasound of carotid artery   . Doppler ultrasound of carotid artery   . History of kidney stones   . Pain     LEFT KNEE - MENISCAL TEAR    Past Surgical History  Procedure Laterality Date  . Coronary artery bypass graft  December 2011    LIMA-LAD, L Radial-OM2, SVG-DX, SVG-PD  . Prostatectomy  2008  . Lithotripsy      X 2  . Knee arthroscopy Left 12/18/2013    Procedure: LEFT ARTHROSCOPY KNEE WITH DEBRIDMENT medial and lateral menisectomy condroplasty;  Surgeon: Ralph Alf, MD;  Location: [facility];  Service: Orthopedics;  Laterality: Left;    Patient Active Problem List   Diagnosis Date Noted  . Acute  medial meniscal tear 12/17/2013  . Sinus bradycardia 08/29/2013  . Doppler ultrasound of carotid artery   . Hx of CABG   . Prostate cancer   . Neck pain   . Murmur   . Ejection fraction   . CAD (coronary artery disease)   . HTN (hypertension)   . Hyperlipidemia       Current Outpatient Prescriptions  Medication Sig Dispense Refill  . aspirin 81 MG tablet Take 81 mg by mouth daily.    [facility] atorvastatin (LIPITOR) 40 MG tablet Take 1 tablet (40 mg total) by mouth every morning. 30 tablet 6  . lisinopril (PRINIVIL,ZESTRIL) 5 MG tablet Take 1 tablet (5 mg total) by mouth every morning. 30 tablet 6  . metoprolol (LOPRESSOR) 50 MG tablet Take 50 mg by mouth 2 (two) times daily.      . Omega-3 Fatty Acids (FISH OIL TRIPLE STRENGTH) 1400 MG CAPS Take 1 capsule by mouth daily.     No current facility-administered medications for this visit.    Allergies:   Review of patient's allergies indicates no known allergies.    Social History:  The patient  reports that he quit smoking about 26 years ago. His smoking use included Cigarettes. He has a 8 pack-year smoking history. He  has never used smokeless tobacco. He reports that he does not drink alcohol or use illicit drugs.   Family History:  The patient's family history includes Alzheimer's disease in his mother; Stroke in his father.    ROS:  Please see the history of present illness.    Patient denies fever, chills, headache, sweats, rash, change in vision, change in hearing, chest pain, cough, nausea or vomiting, urinary symptoms. All other systems are reviewed and are negative.    PHYSICAL EXAM: VS:  BP 130/74 mmHg  Pulse 47  Ht 5\' 8"  (1.727 m)  Wt 216 lb 1.9 oz (98.031 kg)  BMI 32.87 kg/m2  SpO2 97% , Patient is oriented to person time and place. Affect is normal. Head is atraumatic. Sclera and conjunctiva are normal. There is no jugulovenous distention. Lungs are clear. Respiratory effort is nonlabored. Cardiac exam reveals S1 and S2.  The abdomen is soft. There is no peripheral edema. There are no musculoskeletal deformities. There are no skin rashes. Neurologic is grossly intact.  EKG:   EKG is done today and reviewed by me. There is sinus bradycardia. There is no change in the QRS.   Recent Labs: No results found for requested labs within last 365 days.    Lipid Panel    Component Value Date/Time   CHOL 183 03/24/2011 1023   TRIG 89.0 03/24/2011 1023   HDL 54.30 03/24/2011 1023   CHOLHDL 3 03/24/2011 1023   VLDL 17.8 03/24/2011 1023   LDLCALC 111* 03/24/2011 1023      Wt Readings from Last 3 Encounters:  03/12/15 216 lb 1.9 oz (98.031 kg)  09/08/14 233 lb 1.9 oz (105.743 kg)  08/29/13 232 lb (105.235 kg)      Current medicines are reviewed  The patient understands his medications.     ASSESSMENT AND PLAN:

## 2015-03-12 NOTE — Addendum Note (Signed)
Addended by: Merlene Laughter on: 03/12/2015 11:26 AM   Modules accepted: Orders, Medications

## 2015-03-12 NOTE — Assessment & Plan Note (Signed)
Patient has asymptomatic sinus bradycardia. He is on a beta blocker. His rate is lower than I would like to see. We will cut his metoprolol dose down from 50 twice a day to 25 twice a day.

## 2015-03-12 NOTE — Assessment & Plan Note (Signed)
Coronary disease is stable.  No further workup is needed. 

## 2015-03-12 NOTE — Assessment & Plan Note (Signed)
Blood pressures controlled. No change in therapy. 

## 2015-05-14 ENCOUNTER — Ambulatory Visit: Payer: Self-pay | Admitting: Orthopedic Surgery

## 2015-05-14 NOTE — Progress Notes (Signed)
Preoperative surgical orders have been place into the Epic hospital system for Ralph [name] on 05/14/2015, 10:42 AM  by Mickel Crow for surgery on 06-01-2015.  Preop Uni Knee orders including Experal, IV Tylenol, and IV Decadron as long as there are no contraindications to the above medications. Ralph Muslim, PA-C

## 2015-05-19 NOTE — Patient Instructions (Signed)
PARNELL SPIELER  05/19/2015   Your procedure is scheduled on:    06/01/15    Report to [facility]  Entrance take [place]  elevators to 3rd floor to  [place] at      0830 AM.  Call this number if you have problems the morning of surgery [phone]   Remember: ONLY 1 PERSON MAY GO WITH YOU TO SHORT STAY TO GET  READY MORNING OF [place].  Do not eat food or drink liquids :After Midnight.     Take these medicines the morning of surgery with A SIP OF WATER:   Metoprolol ( Lopressor )                                 You may not have any metal on your body including hair pins and              piercings  Do not wear jewelry,  lotions, powders or perfumes, deodorant              Men may shave face and neck.   Do not bring valuables to the hospital. [place].  Contacts, dentures or bridgework may not be worn into surgery.  Leave suitcase in the car. After surgery it may be brought to your room.     Special Instructions: coughing and deep breathing exercises, leg exercises              Please read over the following fact sheets you were given: _____________________________________________________________________             [facility] - Preparing for Surgery Before surgery, you can play an important role.  Because skin is not sterile, your skin needs to be as free of germs as possible.  You can reduce the number of germs on your skin by washing with CHG (chlorahexidine gluconate) soap before surgery.  CHG is an antiseptic cleaner which kills germs and bonds with the skin to continue killing germs even after washing. Please DO NOT use if you have an allergy to CHG or antibacterial soaps.  If your skin becomes reddened/irritated stop using the CHG and inform your nurse when you arrive at Short Stay. Do not shave (including legs and underarms) for at least 48 hours prior to the first CHG shower.  You may  shave your face/neck. Please follow these instructions carefully:  1.  Shower with CHG Soap the night before surgery and the  morning of Surgery.  2.  If you choose to wash your hair, wash your hair first as usual with your  normal  shampoo.  3.  After you shampoo, rinse your hair and body thoroughly to remove the  shampoo.                           4.  Use CHG as you would any other liquid soap.  You can apply chg directly  to the skin and wash                       Gently with a scrungie or clean washcloth.  5.  Apply the CHG Soap to your body ONLY  FROM THE NECK DOWN.   Do not use on face/ open                           Wound or open sores. Avoid contact with eyes, ears mouth and genitals (private parts).                       Wash face,  Genitals (private parts) with your normal soap.             6.  Wash thoroughly, paying special attention to the area where your surgery  will be performed.  7.  Thoroughly rinse your body with warm water from the neck down.  8.  DO NOT shower/wash with your normal soap after using and rinsing off  the CHG Soap.                9.  Pat yourself dry with a clean towel.            10.  Wear clean pajamas.            11.  Place clean sheets on your bed the night of your first shower and do not  sleep with pets. Day of Surgery : Do not apply any lotions/deodorants the morning of surgery.  Please wear clean clothes to the hospital/surgery center.  FAILURE TO FOLLOW THESE INSTRUCTIONS MAY RESULT IN THE CANCELLATION OF YOUR SURGERY PATIENT SIGNATURE_________________________________  NURSE SIGNATURE__________________________________  ________________________________________________________________________  WHAT IS A BLOOD TRANSFUSION? Blood Transfusion Information  A transfusion is the replacement of blood or some of its parts. Blood is made up of multiple cells which provide different functions.  Red blood cells carry oxygen and are used for blood loss  replacement.  White blood cells fight against infection.  Platelets control bleeding.  Plasma helps clot blood.  Other blood products are available for specialized needs, such as hemophilia or other clotting disorders. BEFORE THE TRANSFUSION  Who gives blood for transfusions?   Healthy volunteers who are fully evaluated to make sure their blood is safe. This is blood bank blood. Transfusion therapy is the safest it has ever been in the practice of medicine. Before blood is taken from a donor, a complete history is taken to make sure that person has no history of diseases nor engages in risky social behavior (examples are intravenous drug use or sexual activity with multiple partners). The donor's travel history is screened to minimize risk of transmitting infections, such as malaria. The donated blood is tested for signs of infectious diseases, such as HIV and hepatitis. The blood is then tested to be sure it is compatible with you in order to minimize the chance of a transfusion reaction. If you or a relative donates blood, this is often done in anticipation of surgery and is not appropriate for emergency situations. It takes many days to process the donated blood. RISKS AND COMPLICATIONS Although transfusion therapy is very safe and saves many lives, the main dangers of transfusion include:  1. Getting an infectious disease. 2. Developing a transfusion reaction. This is an allergic reaction to something in the blood you were given. Every precaution is taken to prevent this. The decision to have a blood transfusion has been considered carefully by your caregiver before blood is given. Blood is not given unless the benefits outweigh the risks. AFTER THE TRANSFUSION  Right after receiving a blood transfusion, you will usually feel much better  and more energetic. This is especially true if your red blood cells have gotten low (anemic). The transfusion raises the level of the red blood cells which  carry oxygen, and this usually causes an energy increase.  The nurse administering the transfusion will monitor you carefully for complications. HOME CARE INSTRUCTIONS  No special instructions are needed after a transfusion. You may find your energy is better. Speak with your caregiver about any limitations on activity for underlying diseases you may have. SEEK MEDICAL CARE IF:   Your condition is not improving after your transfusion.  You develop redness or irritation at the intravenous (IV) site. SEEK IMMEDIATE MEDICAL CARE IF:  Any of the following symptoms occur over the next 12 hours:  Shaking chills.  You have a temperature by mouth above 102 F (38.9 C), not controlled by medicine.  Chest, back, or muscle pain.  People around you feel you are not acting correctly or are confused.  Shortness of breath or difficulty breathing.  Dizziness and fainting.  You get a rash or develop hives.  You have a decrease in urine output.  Your urine turns a dark color or changes to pink, red, or brown. Any of the following symptoms occur over the next 10 days:  You have a temperature by mouth above 102 F (38.9 C), not controlled by medicine.  Shortness of breath.  Weakness after normal activity.  The white part of the eye turns yellow (jaundice).  You have a decrease in the amount of urine or are urinating less often.  Your urine turns a dark color or changes to pink, red, or brown. Document Released: 11/04/2000 Document Revised: 01/30/2012 Document Reviewed: 06/23/2008 [employer] Patient Information 2014 [employer].  _______________________________________________________________________  Incentive Spirometer  An incentive spirometer is a tool that can help keep your lungs clear and active. This tool measures how well you are filling your lungs with each breath. Taking long deep breaths may help reverse or decrease the chance of developing breathing (pulmonary) problems  (especially infection) following:  A long period of time when you are unable to move or be active. BEFORE THE PROCEDURE   If the spirometer includes an indicator to show your best effort, your nurse or respiratory therapist will set it to a desired goal.  If possible, sit up straight or lean slightly forward. Try not to slouch.  Hold the incentive spirometer in an upright position. INSTRUCTIONS FOR USE  3. Sit on the edge of your bed if possible, or sit up as far as you can in bed or on a chair. 4. Hold the incentive spirometer in an upright position. 5. Breathe out normally. 6. Place the mouthpiece in your mouth and seal your lips tightly around it. 7. Breathe in slowly and as deeply as possible, raising the piston or the ball toward the top of the column. 8. Hold your breath for 3-5 seconds or for as long as possible. Allow the piston or ball to fall to the bottom of the column. 9. Remove the mouthpiece from your mouth and breathe out normally. 10. Rest for a few seconds and repeat Steps 1 through 7 at least 10 times every 1-2 hours when you are awake. Take your time and take a few normal breaths between deep breaths. 11. The spirometer may include an indicator to show your best effort. Use the indicator as a goal to work toward during each repetition. 12. After each set of 10 deep breaths, practice coughing to be sure your  lungs are clear. If you have an incision (the cut made at the time of surgery), support your incision when coughing by placing a pillow or rolled up towels firmly against it. Once you are able to get out of bed, walk around indoors and cough well. You may stop using the incentive spirometer when instructed by your caregiver.  RISKS AND COMPLICATIONS  Take your time so you do not get dizzy or light-headed.  If you are in pain, you may need to take or ask for pain medication before doing incentive spirometry. It is harder to take a deep breath if you are having  pain. AFTER USE  Rest and breathe slowly and easily.  It can be helpful to keep track of a log of your progress. Your caregiver can provide you with a simple table to help with this. If you are using the spirometer at home, follow these instructions: [place] IF:   You are having difficultly using the spirometer.  You have trouble using the spirometer as often as instructed.  Your pain medication is not giving enough relief while using the spirometer.  You develop fever of 100.5 F (38.1 C) or higher. SEEK IMMEDIATE MEDICAL CARE IF:   You cough up bloody sputum that had not been present before.  You develop fever of 102 F (38.9 C) or greater.  You develop worsening pain at or near the incision site. MAKE SURE YOU:   Understand these instructions.  Will watch your condition.  Will get help right away if you are not doing well or get worse. Document Released: 03/20/2007 Document Revised: 01/30/2012 Document Reviewed: 05/21/2007 [facility] Patient Information 2014 [place], [place].   ________________________________________________________________________

## 2015-05-20 ENCOUNTER — Encounter (HOSPITAL_COMMUNITY)
Admission: RE | Admit: 2015-05-20 | Discharge: 2015-05-20 | Disposition: A | Discharge status: Home / Self Care | Payer: Medicare Other | Source: Ambulatory Visit | Attending: Orthopedic Surgery | Admitting: Orthopedic Surgery

## 2015-05-20 ENCOUNTER — Encounter (HOSPITAL_COMMUNITY): Payer: Self-pay

## 2015-05-20 DIAGNOSIS — M1712 Unilateral primary osteoarthritis, left knee: Secondary | ICD-10-CM | POA: Insufficient documentation

## 2015-05-20 DIAGNOSIS — Z01812 Encounter for preprocedural laboratory examination: Secondary | ICD-10-CM | POA: Diagnosis present

## 2015-05-20 HISTORY — DX: Unspecified osteoarthritis, unspecified site: M19.90

## 2015-05-20 LAB — COMPREHENSIVE METABOLIC PANEL
ALT: 19 U/L (ref 17–63)
AST: 21 U/L (ref 15–41)
Albumin: 4.5 g/dL (ref 3.5–5.0)
Alkaline Phosphatase: 77 U/L (ref 38–126)
Anion gap: 8 (ref 5–15)
BILIRUBIN TOTAL: 0.6 mg/dL (ref 0.3–1.2)
BUN: 17 mg/dL (ref 6–20)
CALCIUM: 9.6 mg/dL (ref 8.9–10.3)
CHLORIDE: 103 mmol/L (ref 101–111)
CO2: 30 mmol/L (ref 22–32)
CREATININE: 1.04 mg/dL (ref 0.61–1.24)
GFR calc Af Amer: >60 mL/min (ref >60)
Glucose, Bld: 77 mg/dL (ref 65–99)
Potassium: 4.5 mmol/L (ref 3.5–5.1)
SODIUM: 141 mmol/L (ref 135–145)
Total Protein: 7.3 g/dL (ref 6.5–8.1)

## 2015-05-20 LAB — PROTIME-INR
INR: 1.01 (ref 0.00–1.49)
Prothrombin Time: 13.5 seconds (ref 11.6–15.2)

## 2015-05-20 LAB — CBC
HCT: 44.2 % (ref 39.0–52.0)
Hemoglobin: 14.8 g/dL (ref 13.0–17.0)
MCH: 30.1 pg (ref 26.0–34.0)
MCHC: 33.5 g/dL (ref 30.0–36.0)
MCV: 90 fL (ref 78.0–100.0)
PLATELETS: 201 10*3/uL (ref 150–400)
RBC: 4.91 MIL/uL (ref 4.22–5.81)
RDW: 12.3 % (ref 11.5–15.5)
WBC: 6.4 10*3/uL (ref 4.0–10.5)

## 2015-05-20 LAB — URINALYSIS, ROUTINE W REFLEX MICROSCOPIC
BILIRUBIN URINE: NEGATIVE
Glucose, UA: NEGATIVE mg/dL
Hgb urine dipstick: NEGATIVE
Ketones, ur: NEGATIVE mg/dL
Leukocytes, UA: NEGATIVE
NITRITE: NEGATIVE
PH: 5.5 (ref 5.0–8.0)
PROTEIN: NEGATIVE mg/dL
SPECIFIC GRAVITY, URINE: 1.03 (ref 1.005–1.030)
UROBILINOGEN UA: 0.2 mg/dL (ref 0.0–1.0)

## 2015-05-20 LAB — SURGICAL PCR SCREEN
MRSA, PCR: NEGATIVE
STAPHYLOCOCCUS AUREUS: NEGATIVE

## 2015-05-20 LAB — APTT: APTT: 29 s (ref 24–37)

## 2015-05-20 NOTE — Progress Notes (Signed)
EKg-03/12/15 on chart  DR Ron Parker clearance on chart  Dr Scotty Court 02/03/2015 clearance on chart  09/24/14 ECHO- EP[facility]  LOV with Dr Ron Parker- 03/12/15 in [facility]

## 2015-05-26 ENCOUNTER — Ambulatory Visit: Payer: Self-pay | Admitting: Orthopedic Surgery

## 2015-05-26 ENCOUNTER — Other Ambulatory Visit (HOSPITAL_COMMUNITY): Payer: Medicare Other

## 2015-05-26 NOTE — H&P (Signed)
Early Chars. Ralph [name] DOB: Oct 06, 1951 Married / Language: English / Race: White Male Date of Admission:  06/01/2015 CC:  Left Knee Pain History of Present Illness The patient is a 64 year old male who comes in for a preoperative History and Physical. The patient is scheduled for a left unicompartmental to be performed by Dr. [name]. Aluisio, MD at [facility] on 06/01/15. The patient is a 64 year old male who presents for follow up of their knee. The patient is being followed for their left knee pain and osteoarthritis. They are now several month(s) out from a series of Synvisc. Symptoms reported include: pain and giving way, while the patient does not report symptoms of: swelling. The patient feels that they are doing poorly and report their pain level to be moderate. Current treatment includes: activity modification. The following medication has been used for pain control: Aleve. The patient has not gotten any relief of their symptoms with viscosupplementation (didn't help much at all). The patient reports right knee symptoms including: pain which began 3 month(s) (or more) ago without any known injury. Note for "Knee pain": He feels the right knee started due to compensating for the left knee. Unfortunately, his left knee is getting progressively worse. It is hurting with most activity now. He is even starting to get some pain in his right knee from favoring the left. He feels as though the knee is limiting what he can and cannot do. He is not having swelling or mechanical symptoms. The pain is mainly isolated to the medial side of his knee. He is ready to get the knee fixed. They have been treated conservatively in the past for the above stated problem and despite conservative measures, they continue to have progressive pain and severe functional limitations and dysfunction. They have failed non-operative management including home exercise, medications, and injections. It is felt that they would  benefit from undergoing total joint replacement. Risks and benefits of the procedure have been discussed with the patient and they elect to proceed with surgery. There are no active contraindications to surgery such as ongoing infection or rapidly progressive neurological disease.  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 Cancer Brother.  Social History No history of drug/alcohol rehab Marital status married Tobacco use Former smoker. 10/23/2013: smoke(d) 1/2 pack(s) per day Tobacco / smoke exposure 10/23/2013: no Living situation live with spouse Current drinker 10/23/2013: Currently drinks beer and hard liquor only occasionally per week Children 2 Exercise Exercises weekly; does running / walking and individual sport Current work status retired  Medication History Aleve (220MG  Capsule, 2 Oral daily) Active. Metoprolol Tartrate (50MG  Tablet, Oral) Active. (qd) Atorvastatin Calcium (40MG  Tablet, Oral) Active. (qd) Lisinopril (5MG  Tablet, Oral) Active. (qd) Aspirin EC (81MG  Tablet DR, Oral) Active. (qd) Fish Oil Burp-Less (Oral) Specific dose unknown - Active. (1400mg  qd) Medications Reconciled  Past Surgical History Prostatectomy; Abdominal Date: 2007. Coronary Artery Bypass Graft Date: 2011. 4 Vasectomy Lithotripsy  Review of Systems  Constitutional: Negative.   HENT: Negative.   Respiratory: Negative.   Cardiovascular: Negative.   Gastrointestinal: Negative.   Genitourinary:       Nocturia  Musculoskeletal: Positive for joint pain.    Vitals  05/14/2015 4:45 PM Weight:  230  lb Height: 68in Weight was reported by patient. Height was reported by patient. Body Surface Area: 2.17 m Body Mass Index: 34.97 kg/m  BP: 114/72 (Sitting, Right Arm, Standard)  Physical Exam General Mental Status -Alert, cooperative and good historian. General Appearance-pleasant, Not in acute distress. Orientation-Oriented X3. Build & Nutrition-Well nourished and Well developed.  Head and Neck Head-normocephalic, atraumatic . Neck Global Assessment - supple, no bruit auscultated on the right, no bruit auscultated on the left.  Eye Pupil - Bilateral-Regular and Round. Motion - Bilateral-EOMI.  Chest and Lung Exam Auscultation Breath sounds - clear at anterior chest wall and clear at posterior chest wall. Adventitious sounds - No Adventitious sounds.  Cardiovascular Auscultation Rhythm - Regular rate and rhythm. Heart Sounds - S1 WNL and S2 WNL. Murmurs & Other Heart Sounds: Murmur 1 - Location - Aortic Area. Timing - Holosystolic. Grade - III/VI. Radiation - Right carotid.  Abdomen Palpation/Percussion Tenderness - Abdomen is non-tender to palpation. Rigidity (guarding) - Abdomen is soft. Auscultation Auscultation of the abdomen reveals - Bowel sounds normal.  Male Genitourinary Note: Not done, not pertinent to present illness  Musculoskeletal Note: On exam, he is a well-developed male alert and oriented and in no apparent distress. His hip show normal range of motion with no discomfort. His left knee shows no effusion. His range of motion is about 0 to 125. There is no crepitus on range of motion. He is very tender on the medial joint line. There is no lateral tenderness or instability. His right knee shows no effusion. Range is 0 to 135. There is slight crepitus on range of motion. There is slight tenderness medial greater than lateral with no instability noted. Pulses, sensation, and motor are intact in both lower  extremities.  RADIOGRAPHS: His radiographs, AP of both knees and lateral show that he has bone on bone change in the medial compartment of the left knee. He does not have any patellofemoral or lateral involvement. His right knee shows some mild medial narrowing.  Assessment & Plan Primary osteoarthritis of left knee (M17.12) Note:Surgical Plans: Left Unicompatmental Knee Replacement  Disposition: Home  PCP: Dr. Matthias Hughs - Patient has been seen preoperatively and felt to be stable for surgery. Cards: Dr. Ron Parker - Patient has been seen preoperatively and felt to be stable for surgery. Uro: Dr. Jeffie Pollock  Topical TXA - CAD, Prostate Cancer  Anesthesia Issues: None  Signed electronically by Ok Edwards, III PA-C

## 2015-06-01 ENCOUNTER — Inpatient Hospital Stay (HOSPITAL_COMMUNITY): Payer: Medicare Other | Admitting: Registered Nurse

## 2015-06-01 ENCOUNTER — Observation Stay (HOSPITAL_COMMUNITY)
Admission: RE | Admit: 2015-06-01 | Discharge: 2015-06-02 | Disposition: A | Discharge status: Home / Self Care | Payer: Medicare Other | Source: Ambulatory Visit | Attending: Orthopedic Surgery | Admitting: Orthopedic Surgery

## 2015-06-01 ENCOUNTER — Encounter (HOSPITAL_COMMUNITY)
Admission: RE | Disposition: A | Discharge status: Home / Self Care | Payer: Self-pay | Source: Ambulatory Visit | Attending: Orthopedic Surgery

## 2015-06-01 ENCOUNTER — Encounter (HOSPITAL_COMMUNITY): Payer: Self-pay | Admitting: *Deleted

## 2015-06-01 DIAGNOSIS — Z8546 Personal history of malignant neoplasm of prostate: Secondary | ICD-10-CM | POA: Insufficient documentation

## 2015-06-01 DIAGNOSIS — I10 Essential (primary) hypertension: Secondary | ICD-10-CM | POA: Diagnosis not present

## 2015-06-01 DIAGNOSIS — I252 Old myocardial infarction: Secondary | ICD-10-CM | POA: Diagnosis not present

## 2015-06-01 DIAGNOSIS — Z7982 Long term (current) use of aspirin: Secondary | ICD-10-CM | POA: Diagnosis not present

## 2015-06-01 DIAGNOSIS — M4806 Spinal stenosis, lumbar region: Secondary | ICD-10-CM | POA: Insufficient documentation

## 2015-06-01 DIAGNOSIS — M1712 Unilateral primary osteoarthritis, left knee: Principal | ICD-10-CM | POA: Insufficient documentation

## 2015-06-01 DIAGNOSIS — Z87891 Personal history of nicotine dependence: Secondary | ICD-10-CM | POA: Diagnosis not present

## 2015-06-01 DIAGNOSIS — Z6834 Body mass index (BMI) 34.0-34.9, adult: Secondary | ICD-10-CM | POA: Diagnosis not present

## 2015-06-01 DIAGNOSIS — M1710 Unilateral primary osteoarthritis, unspecified knee: Secondary | ICD-10-CM | POA: Diagnosis present

## 2015-06-01 DIAGNOSIS — E780 Pure hypercholesterolemia: Secondary | ICD-10-CM | POA: Diagnosis not present

## 2015-06-01 DIAGNOSIS — Z79899 Other long term (current) drug therapy: Secondary | ICD-10-CM | POA: Diagnosis not present

## 2015-06-01 DIAGNOSIS — I2510 Atherosclerotic heart disease of native coronary artery without angina pectoris: Secondary | ICD-10-CM | POA: Insufficient documentation

## 2015-06-01 DIAGNOSIS — M179 Osteoarthritis of knee, unspecified: Secondary | ICD-10-CM | POA: Diagnosis present

## 2015-06-01 HISTORY — PX: PARTIAL KNEE ARTHROPLASTY: SHX2174

## 2015-06-01 LAB — TYPE AND SCREEN
ABO/RH(D): O NEG
ANTIBODY SCREEN: NEGATIVE

## 2015-06-01 SURGERY — ARTHROPLASTY, KNEE, UNICOMPARTMENTAL
Anesthesia: Spinal | Site: Knee | Laterality: Left

## 2015-06-01 MED ORDER — PHENOL 1.4 % MT LIQD: 1.0000 | OROMUCOSAL | Status: DC | PRN

## 2015-06-01 MED ORDER — FLEET ENEMA 7-19 GM/118ML RE ENEM: 1.0000 | ENEMA | Freq: Once | RECTAL | Status: AC | PRN

## 2015-06-01 MED ORDER — CEFAZOLIN SODIUM-DEXTROSE 2-3 GM-% IV SOLR
INTRAVENOUS | Status: DC | PRN
  Administered 2015-06-01: 2 g via INTRAVENOUS

## 2015-06-01 MED ORDER — ACETAMINOPHEN 650 MG RE SUPP: 650.0000 mg | Freq: Four times a day (QID) | RECTAL | Status: DC | PRN

## 2015-06-01 MED ORDER — ATORVASTATIN CALCIUM 40 MG PO TABS
40.0000 mg | ORAL_TABLET | Freq: Every morning | ORAL | Status: DC
  Administered 2015-06-02: 40 mg via ORAL
  Filled 2015-06-01: qty 1

## 2015-06-01 MED ORDER — DEXTROSE-NACL 5-0.9 % IV SOLN
INTRAVENOUS | Status: DC
  Administered 2015-06-01: 09:00:00 via INTRAVENOUS

## 2015-06-01 MED ORDER — PROPOFOL INFUSION 10 MG/ML OPTIME
INTRAVENOUS | Status: DC | PRN
  Administered 2015-06-01: 75 ug/kg/min via INTRAVENOUS

## 2015-06-01 MED ORDER — PROPOFOL 500 MG/50ML IV EMUL
INTRAVENOUS | Status: DC | PRN
  Administered 2015-06-01 (×2): 20 mg via INTRAVENOUS

## 2015-06-01 MED ORDER — MORPHINE SULFATE 2 MG/ML IJ SOLN: 1.0000 mg | INTRAMUSCULAR | Status: DC | PRN

## 2015-06-01 MED ORDER — ACETAMINOPHEN 10 MG/ML IV SOLN
1000.0000 mg | Freq: Once | INTRAVENOUS | Status: AC
  Administered 2015-06-01: 1000 mg via INTRAVENOUS
  Filled 2015-06-01: qty 100

## 2015-06-01 MED ORDER — TRAMADOL HCL 50 MG PO TABS: 50.0000 mg | ORAL_TABLET | Freq: Four times a day (QID) | ORAL | Status: DC | PRN

## 2015-06-01 MED ORDER — DEXAMETHASONE SODIUM PHOSPHATE 10 MG/ML IJ SOLN
10.0000 mg | Freq: Once | INTRAMUSCULAR | Status: AC
  Administered 2015-06-02: 10 mg via INTRAVENOUS
  Filled 2015-06-01: qty 1

## 2015-06-01 MED ORDER — LACTATED RINGERS IV SOLN
INTRAVENOUS | Status: DC | PRN
  Administered 2015-06-01 (×2): via INTRAVENOUS

## 2015-06-01 MED ORDER — OXYCODONE HCL 5 MG PO TABS
5.0000 mg | ORAL_TABLET | ORAL | Status: DC | PRN
  Administered 2015-06-01: 5 mg via ORAL
  Administered 2015-06-01 – 2015-06-02 (×5): 10 mg via ORAL
  Filled 2015-06-01 (×2): qty 2
  Filled 2015-06-01: qty 1
  Filled 2015-06-01 (×4): qty 2

## 2015-06-01 MED ORDER — FENTANYL CITRATE (PF) 100 MCG/2ML IJ SOLN
INTRAMUSCULAR | Status: DC | PRN
  Administered 2015-06-01: 100 ug via INTRAVENOUS

## 2015-06-01 MED ORDER — BUPIVACAINE HCL (PF) 0.25 % IJ SOLN
INTRAMUSCULAR | Status: DC | PRN
  Administered 2015-06-01: 20 mL

## 2015-06-01 MED ORDER — DEXAMETHASONE SODIUM PHOSPHATE 10 MG/ML IJ SOLN
INTRAMUSCULAR | Status: AC
  Filled 2015-06-01: qty 1

## 2015-06-01 MED ORDER — DOCUSATE SODIUM 100 MG PO CAPS
100.0000 mg | ORAL_CAPSULE | Freq: Two times a day (BID) | ORAL | Status: DC
  Administered 2015-06-01 – 2015-06-02 (×2): 100 mg via ORAL

## 2015-06-01 MED ORDER — PROPOFOL 10 MG/ML IV BOLUS
INTRAVENOUS | Status: AC
  Filled 2015-06-01: qty 20

## 2015-06-01 MED ORDER — RIVAROXABAN 10 MG PO TABS
10.0000 mg | ORAL_TABLET | Freq: Every day | ORAL | Status: DC
  Administered 2015-06-02: 10 mg via ORAL
  Filled 2015-06-01 (×2): qty 1

## 2015-06-01 MED ORDER — SODIUM CHLORIDE 0.9 % IV SOLN
2000.0000 mg | Freq: Once | INTRAVENOUS | Status: DC
  Filled 2015-06-01: qty 20

## 2015-06-01 MED ORDER — MENTHOL 3 MG MT LOZG: 1.0000 | LOZENGE | OROMUCOSAL | Status: DC | PRN

## 2015-06-01 MED ORDER — BUPIVACAINE HCL (PF) 0.25 % IJ SOLN
INTRAMUSCULAR | Status: AC
  Filled 2015-06-01: qty 30

## 2015-06-01 MED ORDER — SODIUM CHLORIDE 0.9 % IV SOLN
10000.0000 ug | INTRAVENOUS | Status: DC | PRN
  Administered 2015-06-01: 80 ug/min via INTRAVENOUS

## 2015-06-01 MED ORDER — SODIUM CHLORIDE 0.9 % IR SOLN
Status: DC | PRN
  Administered 2015-06-01: 1000 mL

## 2015-06-01 MED ORDER — KETOROLAC TROMETHAMINE 15 MG/ML IJ SOLN: 7.5000 mg | Freq: Four times a day (QID) | INTRAMUSCULAR | Status: DC | PRN

## 2015-06-01 MED ORDER — METOCLOPRAMIDE HCL 10 MG PO TABS: 5.0000 mg | ORAL_TABLET | Freq: Three times a day (TID) | ORAL | Status: DC | PRN

## 2015-06-01 MED ORDER — CEFAZOLIN SODIUM-DEXTROSE 2-3 GM-% IV SOLR
INTRAVENOUS | Status: AC
  Filled 2015-06-01: qty 50

## 2015-06-01 MED ORDER — DEXAMETHASONE SODIUM PHOSPHATE 10 MG/ML IJ SOLN
10.0000 mg | Freq: Once | INTRAMUSCULAR | Status: AC
  Administered 2015-06-01: 10 mg via INTRAVENOUS

## 2015-06-01 MED ORDER — METOPROLOL TARTRATE 25 MG PO TABS
25.0000 mg | ORAL_TABLET | Freq: Two times a day (BID) | ORAL | Status: DC
  Administered 2015-06-01 – 2015-06-02 (×2): 25 mg via ORAL
  Filled 2015-06-01 (×3): qty 1

## 2015-06-01 MED ORDER — BISACODYL 10 MG RE SUPP: 10.0000 mg | Freq: Every day | RECTAL | Status: DC | PRN

## 2015-06-01 MED ORDER — ACETAMINOPHEN 500 MG PO TABS
1000.0000 mg | ORAL_TABLET | Freq: Four times a day (QID) | ORAL | Status: AC
  Administered 2015-06-01 – 2015-06-02 (×4): 1000 mg via ORAL
  Filled 2015-06-01 (×4): qty 2

## 2015-06-01 MED ORDER — PROMETHAZINE HCL 25 MG/ML IJ SOLN: 6.2500 mg | INTRAMUSCULAR | Status: DC | PRN

## 2015-06-01 MED ORDER — 0.9 % SODIUM CHLORIDE (POUR BTL) OPTIME
TOPICAL | Status: DC | PRN
  Administered 2015-06-01: 1000 mL

## 2015-06-01 MED ORDER — ACETAMINOPHEN 10 MG/ML IV SOLN
INTRAVENOUS | Status: AC
  Filled 2015-06-01: qty 100

## 2015-06-01 MED ORDER — METHOCARBAMOL 1000 MG/10ML IJ SOLN
500.0000 mg | Freq: Four times a day (QID) | INTRAMUSCULAR | Status: DC | PRN
  Administered 2015-06-01: 500 mg via INTRAVENOUS
  Filled 2015-06-01 (×2): qty 5

## 2015-06-01 MED ORDER — CEFAZOLIN SODIUM-DEXTROSE 2-3 GM-% IV SOLR: 2.0000 g | INTRAVENOUS | Status: DC

## 2015-06-01 MED ORDER — TRANEXAMIC ACID 1000 MG/10ML IV SOLN
2000.0000 mg | INTRAVENOUS | Status: DC | PRN
  Administered 2015-06-01: 2000 mg via TOPICAL

## 2015-06-01 MED ORDER — DIPHENHYDRAMINE HCL 12.5 MG/5ML PO ELIX: 12.5000 mg | ORAL_SOLUTION | ORAL | Status: DC | PRN

## 2015-06-01 MED ORDER — SODIUM CHLORIDE 0.9 % IJ SOLN
INTRAMUSCULAR | Status: DC | PRN
  Administered 2015-06-01: 30 mL

## 2015-06-01 MED ORDER — FENTANYL CITRATE (PF) 100 MCG/2ML IJ SOLN
INTRAMUSCULAR | Status: AC
  Filled 2015-06-01: qty 2

## 2015-06-01 MED ORDER — SODIUM CHLORIDE 0.9 % IV SOLN: INTRAVENOUS | Status: DC

## 2015-06-01 MED ORDER — ONDANSETRON HCL 4 MG/2ML IJ SOLN
INTRAMUSCULAR | Status: AC
  Filled 2015-06-01: qty 2

## 2015-06-01 MED ORDER — MIDAZOLAM HCL 2 MG/2ML IJ SOLN
INTRAMUSCULAR | Status: AC
  Filled 2015-06-01: qty 2

## 2015-06-01 MED ORDER — SODIUM CHLORIDE 0.9 % IJ SOLN
INTRAMUSCULAR | Status: AC
  Filled 2015-06-01: qty 50

## 2015-06-01 MED ORDER — CEFAZOLIN SODIUM-DEXTROSE 2-3 GM-% IV SOLR
2.0000 g | Freq: Four times a day (QID) | INTRAVENOUS | Status: AC
  Administered 2015-06-01 (×2): 2 g via INTRAVENOUS
  Filled 2015-06-01 (×2): qty 50

## 2015-06-01 MED ORDER — CHLORHEXIDINE GLUCONATE 4 % EX LIQD: 60.0000 mL | Freq: Once | CUTANEOUS | Status: DC

## 2015-06-01 MED ORDER — BUPIVACAINE LIPOSOME 1.3 % IJ SUSP
INTRAMUSCULAR | Status: DC | PRN
  Administered 2015-06-01: 20 mL

## 2015-06-01 MED ORDER — ACETAMINOPHEN 325 MG PO TABS: 650.0000 mg | ORAL_TABLET | Freq: Four times a day (QID) | ORAL | Status: DC | PRN

## 2015-06-01 MED ORDER — METHOCARBAMOL 500 MG PO TABS: 500.0000 mg | ORAL_TABLET | Freq: Four times a day (QID) | ORAL | Status: DC | PRN

## 2015-06-01 MED ORDER — MEPERIDINE HCL 50 MG/ML IJ SOLN: 6.2500 mg | INTRAMUSCULAR | Status: DC | PRN

## 2015-06-01 MED ORDER — FENTANYL CITRATE (PF) 100 MCG/2ML IJ SOLN
25.0000 ug | INTRAMUSCULAR | Status: DC | PRN
Start: 2015-06-01 — End: 2015-06-01

## 2015-06-01 MED ORDER — ONDANSETRON HCL 4 MG PO TABS: 4.0000 mg | ORAL_TABLET | Freq: Four times a day (QID) | ORAL | Status: DC | PRN

## 2015-06-01 MED ORDER — ONDANSETRON HCL 4 MG/2ML IJ SOLN
INTRAMUSCULAR | Status: DC | PRN
  Administered 2015-06-01: 4 mg via INTRAVENOUS

## 2015-06-01 MED ORDER — ASPIRIN EC 81 MG PO TBEC
81.0000 mg | DELAYED_RELEASE_TABLET | Freq: Every day | ORAL | Status: DC
  Administered 2015-06-02: 81 mg via ORAL
  Filled 2015-06-01: qty 1

## 2015-06-01 MED ORDER — POLYETHYLENE GLYCOL 3350 17 G PO PACK
17.0000 g | PACK | Freq: Every day | ORAL | Status: DC | PRN
  Administered 2015-06-02: 17 g via ORAL
  Filled 2015-06-01: qty 1

## 2015-06-01 MED ORDER — ONDANSETRON HCL 4 MG/2ML IJ SOLN: 4.0000 mg | Freq: Four times a day (QID) | INTRAMUSCULAR | Status: DC | PRN

## 2015-06-01 MED ORDER — BUPIVACAINE LIPOSOME 1.3 % IJ SUSP
20.0000 mL | Freq: Once | INTRAMUSCULAR | Status: DC
  Filled 2015-06-01: qty 20

## 2015-06-01 MED ORDER — METOCLOPRAMIDE HCL 5 MG/ML IJ SOLN: 5.0000 mg | Freq: Three times a day (TID) | INTRAMUSCULAR | Status: DC | PRN

## 2015-06-01 MED ORDER — MIDAZOLAM HCL 5 MG/5ML IJ SOLN
INTRAMUSCULAR | Status: DC | PRN
  Administered 2015-06-01: 2 mg via INTRAVENOUS

## 2015-06-01 MED ORDER — ASPIRIN 81 MG PO TABS: 81.0000 mg | ORAL_TABLET | Freq: Every morning | ORAL | Status: DC

## 2015-06-01 MED ORDER — PHENYLEPHRINE HCL 10 MG/ML IJ SOLN
INTRAMUSCULAR | Status: AC
  Filled 2015-06-01: qty 1

## 2015-06-01 SURGICAL SUPPLY — 56 items
BAG DECANTER FOR FLEXI CONT (MISCELLANEOUS) ×3 IMPLANT
BAG SPEC THK2 15X12 ZIP CLS (MISCELLANEOUS)
BAG ZIPLOCK 12X15 (MISCELLANEOUS) IMPLANT
BANDAGE ELASTIC 6 VELCRO ST LF (GAUZE/BANDAGES/DRESSINGS) ×3 IMPLANT
BANDAGE ESMARK 6X9 LF (GAUZE/BANDAGES/DRESSINGS) ×1 IMPLANT
BLADE SAW RECIPROCATING 77.5 (BLADE) ×3 IMPLANT
BLADE SAW SGTL 13.0X1.19X90.0M (BLADE) ×3 IMPLANT
BNDG CMPR 9X6 STRL LF SNTH (GAUZE/BANDAGES/DRESSINGS) ×1
BNDG ESMARK 6X9 LF (GAUZE/BANDAGES/DRESSINGS) ×3
BOWL SMART MIX CTS (DISPOSABLE) ×3 IMPLANT
BUR OVAL CARBIDE 4.0 (BURR) ×3 IMPLANT
CAPT KNEE PARTIAL 2 ×3 IMPLANT
CEMENT HV SMART SET (Cement) ×3 IMPLANT
CLOSURE WOUND 1/2 X4 (GAUZE/BANDAGES/DRESSINGS) ×1
CUFF TOURN SGL QUICK 34 (TOURNIQUET CUFF) ×3
CUFF TRNQT CYL 34X4X40X1 (TOURNIQUET CUFF) ×1 IMPLANT
DRAPE EXTREMITY T 121X128X90 (DRAPE) ×3 IMPLANT
DRAPE POUCH INSTRU U-SHP 10X18 (DRAPES) ×3 IMPLANT
DRSG ADAPTIC 3X8 NADH LF (GAUZE/BANDAGES/DRESSINGS) ×3 IMPLANT
DRSG PAD ABDOMINAL 8X10 ST (GAUZE/BANDAGES/DRESSINGS) ×3 IMPLANT
DURAPREP 26ML APPLICATOR (WOUND CARE) ×3 IMPLANT
ELECT REM PT RETURN 9FT ADLT (ELECTROSURGICAL) ×3
ELECTRODE REM PT RTRN 9FT ADLT (ELECTROSURGICAL) ×1 IMPLANT
EVACUATOR 1/8 PVC DRAIN (DRAIN) ×3 IMPLANT
FACESHIELD WRAPAROUND (MASK) ×15 IMPLANT
GAUZE SPONGE 4X4 12PLY STRL (GAUZE/BANDAGES/DRESSINGS) ×3 IMPLANT
GLOVE BIO SURGEON STRL SZ7.5 (GLOVE) ×3 IMPLANT
GLOVE BIO SURGEON STRL SZ8 (GLOVE) ×3 IMPLANT
GLOVE BIOGEL PI IND STRL 8 (GLOVE) ×2 IMPLANT
GLOVE BIOGEL PI INDICATOR 8 (GLOVE) ×4
GOWN STRL REUS W/TWL LRG LVL3 (GOWN DISPOSABLE) ×3 IMPLANT
GOWN STRL REUS W/TWL XL LVL3 (GOWN DISPOSABLE) ×3 IMPLANT
HANDPIECE INTERPULSE COAX TIP (DISPOSABLE) ×3
IMMOBILIZER KNEE 20 (SOFTGOODS) ×3
IMMOBILIZER KNEE 20 THIGH 36 (SOFTGOODS) ×1 IMPLANT
KIT BASIN OR (CUSTOM PROCEDURE TRAY) ×3 IMPLANT
KIT IMPL STRL TIB IPOLY IUNI ×1 IMPLANT
MANIFOLD NEPTUNE II (INSTRUMENTS) ×3 IMPLANT
NDL SAFETY ECLIPSE 18X1.5 (NEEDLE) ×3 IMPLANT
NEEDLE HYPO 18GX1.5 SHARP (NEEDLE) ×9
PACK TOTAL JOINT (CUSTOM PROCEDURE TRAY) ×3 IMPLANT
PAD ABD 8X10 STRL (GAUZE/BANDAGES/DRESSINGS) ×3 IMPLANT
PADDING CAST COTTON 6X4 STRL (CAST SUPPLIES) ×3 IMPLANT
POSITIONER SURGICAL ARM (MISCELLANEOUS) ×3 IMPLANT
SET HNDPC FAN SPRY TIP SCT (DISPOSABLE) ×1 IMPLANT
STRIP CLOSURE SKIN 1/2X4 (GAUZE/BANDAGES/DRESSINGS) ×2 IMPLANT
SUCTION FRAZIER 12FR DISP (SUCTIONS) ×3 IMPLANT
SUT MNCRL AB 4-0 PS2 18 (SUTURE) ×3 IMPLANT
SUT VIC AB 2-0 CT1 27 (SUTURE) ×6
SUT VIC AB 2-0 CT1 TAPERPNT 27 (SUTURE) ×2 IMPLANT
SUT VLOC 180 0 24IN GS25 (SUTURE) ×3 IMPLANT
SYR 20CC LL (SYRINGE) ×3 IMPLANT
SYR 50ML LL SCALE MARK (SYRINGE) ×3 IMPLANT
TOWEL OR 17X26 10 PK STRL BLUE (TOWEL DISPOSABLE) ×3 IMPLANT
TRAY FOLEY W/METER SILVER 14FR (SET/KITS/TRAYS/PACK) ×3 IMPLANT
WRAP KNEE MAXI GEL POST OP (GAUZE/BANDAGES/DRESSINGS) ×3 IMPLANT

## 2015-06-01 NOTE — H&P (View-Only) (Signed)
Early Chars. Ralph [name] DOB: March 12, 1951 Married / Language: English / Race: White Male Date of Admission:  06/01/2015 CC:  Left Knee Pain History of Present Illness The patient is a 64 year old male who comes in for a preoperative History and Physical. The patient is scheduled for a left unicompartmental to be performed by Dr. [name]. Aluisio, MD at [facility] on 06/01/15. The patient is a 64 year old male who presents for follow up of their knee. The patient is being followed for their left knee pain and osteoarthritis. They are now several month(s) out from a series of Synvisc. Symptoms reported include: pain and giving way, while the patient does not report symptoms of: swelling. The patient feels that they are doing poorly and report their pain level to be moderate. Current treatment includes: activity modification. The following medication has been used for pain control: Aleve. The patient has not gotten any relief of their symptoms with viscosupplementation (didn't help much at all). The patient reports right knee symptoms including: pain which began 3 month(s) (or more) ago without any known injury. Note for "Knee pain": He feels the right knee started due to compensating for the left knee. Unfortunately, his left knee is getting progressively worse. It is hurting with most activity now. He is even starting to get some pain in his right knee from favoring the left. He feels as though the knee is limiting what he can and cannot do. He is not having swelling or mechanical symptoms. The pain is mainly isolated to the medial side of his knee. He is ready to get the knee fixed. They have been treated conservatively in the past for the above stated problem and despite conservative measures, they continue to have progressive pain and severe functional limitations and dysfunction. They have failed non-operative management including home exercise, medications, and injections. It is felt that they would  benefit from undergoing total joint replacement. Risks and benefits of the procedure have been discussed with the patient and they elect to proceed with surgery. There are no active contraindications to surgery such as ongoing infection or rapidly progressive neurological disease.  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 Cancer Brother.  Social History No history of drug/alcohol rehab Marital status married Tobacco use Former smoker. 10/23/2013: smoke(d) 1/2 pack(s) per day Tobacco / smoke exposure 10/23/2013: no Living situation live with spouse Current drinker 10/23/2013: Currently drinks beer and hard liquor only occasionally per week Children 2 Exercise Exercises weekly; does running / walking and individual sport Current work status retired  Medication History Aleve (220MG  Capsule, 2 Oral daily) Active. Metoprolol Tartrate (50MG  Tablet, Oral) Active. (qd) Atorvastatin Calcium (40MG  Tablet, Oral) Active. (qd) Lisinopril (5MG  Tablet, Oral) Active. (qd) Aspirin EC (81MG  Tablet DR, Oral) Active. (qd) Fish Oil Burp-Less (Oral) Specific dose unknown - Active. (1400mg  qd) Medications Reconciled  Past Surgical History Prostatectomy; Abdominal Date: 2007. Coronary Artery Bypass Graft Date: 2011. 4 Vasectomy Lithotripsy  Review of Systems  Constitutional: Negative.   HENT: Negative.   Respiratory: Negative.   Cardiovascular: Negative.   Gastrointestinal: Negative.   Genitourinary:       Nocturia  Musculoskeletal: Positive for joint pain.    Vitals  05/14/2015 4:45 PM Weight:  230  lb Height: 68in Weight was reported by patient. Height was reported by patient. Body Surface Area: 2.17 m Body Mass Index: 34.97 kg/m  BP: 114/72 (Sitting, Right Arm, Standard)  Physical Exam General Mental Status -Alert, cooperative and good historian. General Appearance-pleasant, Not in acute distress. Orientation-Oriented X3. Build & Nutrition-Well nourished and Well developed.  Head and Neck Head-normocephalic, atraumatic . Neck Global Assessment - supple, no bruit auscultated on the right, no bruit auscultated on the left.  Eye Pupil - Bilateral-Regular and Round. Motion - Bilateral-EOMI.  Chest and Lung Exam Auscultation Breath sounds - clear at anterior chest wall and clear at posterior chest wall. Adventitious sounds - No Adventitious sounds.  Cardiovascular Auscultation Rhythm - Regular rate and rhythm. Heart Sounds - S1 WNL and S2 WNL. Murmurs & Other Heart Sounds: Murmur 1 - Location - Aortic Area. Timing - Holosystolic. Grade - III/VI. Radiation - Right carotid.  Abdomen Palpation/Percussion Tenderness - Abdomen is non-tender to palpation. Rigidity (guarding) - Abdomen is soft. Auscultation Auscultation of the abdomen reveals - Bowel sounds normal.  Male Genitourinary Note: Not done, not pertinent to present illness  Musculoskeletal Note: On exam, he is a well-developed male alert and oriented and in no apparent distress. His hip show normal range of motion with no discomfort. His left knee shows no effusion. His range of motion is about 0 to 125. There is no crepitus on range of motion. He is very tender on the medial joint line. There is no lateral tenderness or instability. His right knee shows no effusion. Range is 0 to 135. There is slight crepitus on range of motion. There is slight tenderness medial greater than lateral with no instability noted. Pulses, sensation, and motor are intact in both lower  extremities.  RADIOGRAPHS: His radiographs, AP of both knees and lateral show that he has bone on bone change in the medial compartment of the left knee. He does not have any patellofemoral or lateral involvement. His right knee shows some mild medial narrowing.  Assessment & Plan Primary osteoarthritis of left knee (M17.12) Note:Surgical Plans: Left Unicompatmental Knee Replacement  Disposition: Home  PCP: Dr. Matthias Hughs - Patient has been seen preoperatively and felt to be stable for surgery. Cards: Dr. Ron Parker - Patient has been seen preoperatively and felt to be stable for surgery. Uro: Dr. Jeffie Pollock  Topical TXA - CAD, Prostate Cancer  Anesthesia Issues: None  Signed electronically by Ok Edwards, III PA-C

## 2015-06-01 NOTE — Transfer of Care (Signed)
Immediate Anesthesia Transfer of Care Note  Patient: Ralph [name]  Procedure(s) Performed: Procedure(s): LEFT KNEE MEDIAL UNICOMPARTMENTAL ARTHROPLASTY (Left)  Patient Location: PACU  Anesthesia Type:Spinal  Level of Consciousness: awake, alert , oriented and patient cooperative  Airway & Oxygen Therapy: Patient Spontanous Breathing and Patient connected to face mask oxygen  Post-op Assessment: Report given to RN and Post -op Vital signs reviewed and stable  Post vital signs: stable  Last Vitals:  Filed Vitals:   06/01/15 0830  BP: 123/65  Pulse: 69  Temp:   Resp: 18    Complications: No apparent anesthesia complications  T 12 spinal level

## 2015-06-01 NOTE — Interval H&P Note (Signed)
History and Physical Interval Note:  06/01/2015 6:46 AM  Ralph [name]  has presented today for surgery, with the diagnosis of OA OF LEFT KNEE UNICOMPARMENTAL  The various methods of treatment have been discussed with the patient and family. After consideration of risks, benefits and other options for treatment, the patient has consented to  Procedure(s): LEFT KNEE MEDIAL UNICOMPARTMENTAL ARTHROPLASTY (Left) as a surgical intervention .  The patient's history has been reviewed, patient examined, no change in status, stable for surgery.  I have reviewed the patient's chart and labs.  Questions were answered to the patient's satisfaction.     Gearlean Alf

## 2015-06-01 NOTE — Anesthesia Preprocedure Evaluation (Signed)
Anesthesia Evaluation  Patient identified by MRN, date of birth, ID band Patient awake    Reviewed: Allergy & Precautions, H&P , NPO status , Patient's Chart, lab work & pertinent test results  Airway Mallampati: III  TM Distance: >3 FB Neck ROM: Full    Dental  (+) Teeth Intact, Caps, Dental Advisory Given,    Pulmonary neg pulmonary ROS, former smoker,  breath sounds clear to auscultation        Cardiovascular hypertension, Pt. on medications and Pt. on home beta blockers + CAD, + Past MI and + CABG negative cardio ROS Normal cardiovascular exam+ dysrhythmias Rhythm:Regular Rate:Normal     Neuro/Psych negative neurological ROS  negative psych ROS   GI/Hepatic negative GI ROS, Neg liver ROS,   Endo/Other  Morbid obesity  Renal/GU negative Renal ROS  negative genitourinary   Musculoskeletal negative musculoskeletal ROS (+)   Abdominal   Peds negative pediatric ROS (+)  Hematology negative hematology ROS (+)   Anesthesia Other Findings   Reproductive/Obstetrics                             Anesthesia Physical  Anesthesia Plan  ASA: III  Anesthesia Plan: Spinal   Post-op Pain Management:    Induction: Intravenous  Airway Management Planned: Simple Face Mask  Additional Equipment:   Intra-op Plan:   Post-operative Plan: Extubation in OR  Informed Consent: I have reviewed the patients History and Physical, chart, labs and discussed the procedure including the risks, benefits and alternatives for the proposed anesthesia with the patient or authorized representative who has indicated his/her understanding and acceptance.   Dental advisory given  Plan Discussed with: CRNA  Anesthesia Plan Comments:         Anesthesia Quick Evaluation

## 2015-06-01 NOTE — Anesthesia Procedure Notes (Signed)
Spinal Patient location during procedure: OR End time: 06/01/2015 7:12 AM Staffing Resident/CRNA: Enrigue Catena E Performed by: anesthesiologist  Preanesthetic Checklist Completed: patient identified, site marked, surgical consent, pre-op evaluation, timeout performed, IV checked, risks and benefits discussed and monitors and equipment checked Spinal Block Patient position: sitting Prep: Betadine Patient monitoring: heart rate, continuous pulse ox and blood pressure Approach: right paramedian Location: L3-4 Injection technique: single-shot Needle Needle type: Sprotte  Needle gauge: 24 G Needle length: 9 cm Assessment Sensory level: T4 Additional Notes Expiration date of kit checked and confirmed. Patient tolerated procedure well, without complications.negative heme or paresthesia

## 2015-06-01 NOTE — Evaluation (Signed)
Physical Therapy Evaluation Patient Details Name: DEMARI GALES MRN: [id number] DOB: 05-30-1951 Today's Date: 06/01/2015   History of Present Illness  L UKR  Clinical Impression  Patient tolerated ambulating today. Patient will benefit from PT to address problems listed in note below.    Follow Up Recommendations Home health PT;Supervision/Assistance - 24 hour    Equipment Recommendations  Rolling walker with 5" wheels    Recommendations for Other Services       Precautions / Restrictions Precautions Precautions: Fall;Knee Required Braces or Orthoses: Knee Immobilizer - Left Knee Immobilizer - Left: Discontinue once straight leg raise with < 10 degree lag      Mobility  Bed Mobility Overal bed mobility: Needs Assistance Bed Mobility: Supine to Sit;Sit to Supine     Supine to sit: Min assist     General bed mobility comments: cues for technique  Transfers Overall transfer level: Needs assistance Equipment used: Rolling walker (2 wheeled) Transfers: Sit to/from Stand Sit to Stand: Min assist         General transfer comment: cues for  hand and L leg position  Ambulation/Gait Ambulation/Gait assistance: Min assist Ambulation Distance (Feet): 120 Feet Assistive device: Rolling walker (2 wheeled) Gait Pattern/deviations: Step-to pattern;Antalgic;Decreased step length - left     General Gait Details: cues for sequence and posture  Stairs            Wheelchair Mobility    Modified Rankin (Stroke Patients Only)       Balance                                             Pertinent Vitals/Pain Pain Assessment: 0-10 Pain Score: 3  Pain Location: L knee Pain Descriptors / Indicators: Aching Pain Intervention(s): Monitored during session;Premedicated before session;Ice applied    Home Living Family/patient expects to be discharged to:: Private residence Living Arrangements: Spouse/significant other Available Help at Discharge:  Family Type of Home: House Home Access: Stairs to enter Entrance Stairs-Rails: Psychiatric nurse of Steps: 3 Home Layout: Multi-level Home Equipment: Crutches      Prior Function Level of Independence: Independent               Hand Dominance        Extremity/Trunk Assessment               Lower Extremity Assessment: LLE deficits/detail   LLE Deficits / Details: able to raise leg from bed.     Communication   Communication: No difficulties  Cognition Arousal/Alertness: Awake/alert Behavior During Therapy: WFL for tasks assessed/performed Overall Cognitive Status: Within Functional Limits for tasks assessed                      General Comments      Exercises        Assessment/Plan    PT Assessment Patient needs continued PT services  PT Diagnosis Difficulty walking;Acute pain   PT Problem List Decreased strength;Decreased range of motion;Decreased activity tolerance;Decreased mobility;Decreased knowledge of precautions;Decreased safety awareness;Decreased knowledge of use of DME;Pain  PT Treatment Interventions DME instruction;Gait training;Stair training;Functional mobility training;Therapeutic activities;Therapeutic exercise;Patient/family education   PT Goals (Current goals can be found in the Care Plan section) Acute Rehab PT Goals Patient Stated Goal: to go home PT Goal Formulation: With patient/family Time For Goal Achievement: 06/03/15 Potential to Achieve Goals: Good  Frequency 7X/week   Barriers to discharge        Co-evaluation               End of Session Equipment Utilized During Treatment: Left knee immobilizer Activity Tolerance: Patient tolerated treatment well Patient left: in chair;with call bell/phone within reach;with family/visitor present Nurse Communication: Mobility status    Functional Assessment Tool Used: clinical judgement Functional Limitation: Mobility: Walking and moving  around Mobility: Walking and Moving Around Current Status (U1314): At least 20 percent but less than 40 percent impaired, limited or restricted Mobility: Walking and Moving Around Goal Status [phone]): At least 1 percent but less than 20 percent impaired, limited or restricted    Time: 5797-2820 PT Time Calculation (min) (ACUTE ONLY): 17 min   Charges:   PT Evaluation $Initial PT Evaluation Tier I: 1 Procedure     PT G Codes:   PT G-Codes **NOT FOR INPATIENT CLASS** Functional Assessment Tool Used: clinical judgement Functional Limitation: Mobility: Walking and moving around Mobility: Walking and Moving Around Current Status (U0156): At least 20 percent but less than 40 percent impaired, limited or restricted Mobility: Walking and Moving Around Goal Status [phone]): At least 1 percent but less than 20 percent impaired, limited or restricted    Claretha Cooper 06/01/2015, 5:20 PM Tresa Endo PT [phone]

## 2015-06-01 NOTE — Anesthesia Postprocedure Evaluation (Signed)
  Anesthesia Post-op Note  Patient: Ralph [name]  Procedure(s) Performed: Procedure(s) (LRB): LEFT KNEE MEDIAL UNICOMPARTMENTAL ARTHROPLASTY (Left)  Patient Location: PACU  Anesthesia Type: Spinal  Level of Consciousness: awake and alert   Airway and Oxygen Therapy: Patient Spontanous Breathing  Post-op Pain: mild  Post-op Assessment: Post-op Vital signs reviewed, Patient's Cardiovascular Status Stable, Respiratory Function Stable, Patent Airway and No signs of Nausea or vomiting  Last Vitals:  Filed Vitals:   06/01/15 1145  BP: 140/78  Pulse: 59  Temp: 36.6 C  Resp: 16    Post-op Vital Signs: stable   Complications: No apparent anesthesia complications

## 2015-06-01 NOTE — Op Note (Signed)
OPERATIVE REPORT  PREOPERATIVE DIAGNOSIS: Medial compartment osteoarthritis, Left knee  POSTOPERATIVE DIAGNOSIS: Medial compartment osteoarthritis, Left knee  PROCEDURE:Left knee medial unicompartmental arthroplasty.   SURGEON: Ralph Arabian, MD   ASSISTANT: Ardeen Jourdain, PA-C  ANESTHESIA:  Spinal.   ESTIMATED BLOOD LOSS: Minimal.   DRAINS: Hemovac x1.   TOURNIQUET TIME: 30 minutes at 300 mmHg.   COMPLICATIONS: None.   CONDITION: Stable to recovery.   BRIEF CLINICAL NOTE:Ralph [name] is a 64 y.o. male, who has  significant isolated medial compartment arthritis of the Left knee. He has had nonoperative management including injections. He has had  cortisone and viscous supplements. Unfortunately, the pain persists.  Radiograph showed isolated medial compartment bone-on-bone arthritis  with normal-appearing patellofemoral and lateral compartments. He  presents now for left knee unicompartmental arthroplasty.   PROCEDURE IN DETAIL: After successful administration of  Spinal anesthetic, a tourniquet was placed high on the  Left thigh and left lower extremity prepped and draped in usual sterile fashion. Extremity was wrapped in an Esmarch, knee flexed, and tourniquet inflated to 300 mmHg. A midline incision was made with a 10 blade through subcutaneous  tissue to the extensor mechanism. A fresh blade was used to make a  medial parapatellar arthrotomy. Soft tissue on the proximal medial  tibia subperiosteally elevated to the joint line with a knife and into  the semimembranosus bursa with a Cobb elevator. The patella was  subluxed laterally, and the knee flexed 90 degrees. The ACL was intact.  The marginal osteophytes on the medial femur and tibia were removed with  a rongeur. The medial meniscus was also removed. The femoral cutting  block where the conformis unicompartmental knee system was placed along  the femur. There was excellent fit. I traced the  outline. We then  removed any remaining cartilage within this outline. We then placed the  cutting block again and pinned in position. The posterior femoral cut  was made, it was approximately 5 mm. The lug holes for the femoral  component were then drilled through the cutting block. The cutting  block was subsequently removed. We then utilized the high speed burr to  create a small trough at the superior aspect of the components that of  the inset and would not overhang the cartilage. The trial was placed,  it had excellent fit. The trial was subsequently removed.       The trial was placed again and the B chip was placed. There was  excellent balance throughout full motion. Also with excellent fit on  her tibia. This was removed as was the femoral trial. A curette was  used to remove any remaining cartilage from the tibia. The tibial  cutting block was then placed and there was a perfect fit on the tibial  surface. The appropriate slope was placed and it was pinned in  position. The reciprocating saw was used to make the central cut and  then the oscillating saw used to make the horizontal cut. The bone  fragment was then removed. The tibial trial was placed and had perfect  fit on the tibia. We then drilled the 2 lug holes and did the keel punch.  We then placed tibia trial femur, and a 8 mm trial insert. There was  excellent stability throughout full range of motion and no impingement.  The trial was then removed. We drilled small holes in the distal  femur in order to create more conduits for the cement. The cut bone  surfaces were  thoroughly irrigated with pulsatile lavage while the  cement was mixed on the back table. We then cemented the tibial  component into place, impacted it and removed the extruded cement. The  same was done for the femoral component. Trial 8-mm inserts placed,  knee held in full extension, and all extruded cement removed. While the  cement was hardening, I  injected the extensor mechanism, periosteum of  the femur and subcu tissues, a total of 20 mL of Exparel mixed with 30  mL of saline and then did an additional injection of 20 mL of 0.25%  Marcaine into the same tissues. When the cement had fully hardened,  then the permanent polyethylene was placed in tibial tray. There was  excellent stability throughout full range of motion with no lift off the  component and no evidence of any impingement. Wound was copiously  irrigated with saline solution, and the arthrotomy closed over a Hemovac  drain with a running #1 V-Loc suture. The subcutaneous was closed with  interrupted 2-0 Vicryl and subcuticular running 4-0 Monocryl. The drain  was hooked to suction. Incision cleaned and dried and Steri-Strips and  a bulky sterile dressing applied. The tourniquet was released after a  total time of 30 minutes. This was done after closing the extensor  mechanism. The wound was closed and a bulky sterile dressing was  applied. He was placed into a knee immobilizer, awakened and  transported to recovery room in stable condition.  Please note that a surgical assistant was a medical necessity for this  procedure in order to perform it in a safe and expeditious manner.  Assistance was necessary for retracting vital ligaments, neurovascular  structures, as well as for proper positioning of the limb to allow for  appropriate bone cuts and appropriate placement of the prosthesis.    [name] Mardie Kellen, MD

## 2015-06-02 ENCOUNTER — Encounter (HOSPITAL_COMMUNITY): Payer: Self-pay | Admitting: Orthopedic Surgery

## 2015-06-02 DIAGNOSIS — M1712 Unilateral primary osteoarthritis, left knee: Secondary | ICD-10-CM | POA: Diagnosis not present

## 2015-06-02 LAB — CBC
HCT: 40.8 % (ref 39.0–52.0)
Hemoglobin: 13.5 g/dL (ref 13.0–17.0)
MCH: 29.7 pg (ref 26.0–34.0)
MCHC: 33.1 g/dL (ref 30.0–36.0)
MCV: 89.7 fL (ref 78.0–100.0)
Platelets: 192 10*3/uL (ref 150–400)
RBC: 4.55 MIL/uL (ref 4.22–5.81)
RDW: 12.2 % (ref 11.5–15.5)
WBC: 15.9 10*3/uL — ABNORMAL HIGH (ref 4.0–10.5)

## 2015-06-02 LAB — BASIC METABOLIC PANEL
ANION GAP: 7 (ref 5–15)
BUN: 17 mg/dL (ref 6–20)
CO2: 27 mmol/L (ref 22–32)
Calcium: 8.7 mg/dL — ABNORMAL LOW (ref 8.9–10.3)
Chloride: 102 mmol/L (ref 101–111)
Creatinine, Ser: 0.99 mg/dL (ref 0.61–1.24)
GFR calc Af Amer: >60 mL/min (ref >60)
GFR calc non Af Amer: >60 mL/min (ref >60)
Glucose, Bld: 140 mg/dL — ABNORMAL HIGH (ref 65–99)
Potassium: 5 mmol/L (ref 3.5–5.1)
SODIUM: 136 mmol/L (ref 135–145)

## 2015-06-02 MED ORDER — RIVAROXABAN 10 MG PO TABS: 10.0000 mg | ORAL_TABLET | Freq: Every day | ORAL | Status: DC

## 2015-06-02 MED ORDER — TRAMADOL HCL 50 MG PO TABS: 50.0000 mg | ORAL_TABLET | Freq: Four times a day (QID) | ORAL | Status: DC | PRN

## 2015-06-02 MED ORDER — OXYCODONE HCL 5 MG PO TABS: 5.0000 mg | ORAL_TABLET | ORAL | Status: DC | PRN

## 2015-06-02 MED ORDER — METHOCARBAMOL 500 MG PO TABS: 500.0000 mg | ORAL_TABLET | Freq: Four times a day (QID) | ORAL | Status: DC | PRN

## 2015-06-02 NOTE — Progress Notes (Signed)
Physical Therapy Treatment Patient Details Name: Ralph [name] MRN: [id number] DOB: 1951-06-07 Today's Date: 06/02/2015    History of Present Illness L UKR    PT Comments    POD # 1 assisted with amb pt in hallway, practiced stairs then performed all supine TE's following HEP handout.  Instructed on proper tech and freq plus use of ICE.   Pt progressing well and ready for D/C to home.  Follow Up Recommendations  Home health PT;Supervision/Assistance - 24 hour     Equipment Recommendations  Rolling walker with 5" wheels    Recommendations for Other Services       Precautions / Restrictions Precautions Precautions: Fall;Knee Precaution Comments: instructed pt to wear KI for stairs only.  Able to perform SLR. Required Braces or Orthoses: Knee Immobilizer - Left Restrictions Weight Bearing Restrictions: No    Mobility  Bed Mobility Overal bed mobility: Modified Independent             General bed mobility comments: increased time  Transfers Overall transfer level: Needs assistance Equipment used: Rolling walker (2 wheeled) Transfers: Sit to/from Stand Sit to Stand: Modified independent (Device/Increase time);Supervision         General transfer comment: one initial VC on hand placement  Ambulation/Gait Ambulation/Gait assistance: Supervision Ambulation Distance (Feet): 280 Feet Assistive device: Rolling walker (2 wheeled) Gait Pattern/deviations: Step-to pattern;Step-through pattern     General Gait Details: WFL   Stairs Stairs: Yes Stairs assistance: Min guard Stair Management: One rail Left;Step to pattern;Forwards;With crutches Number of Stairs: 4 General stair comments: pt lives in a split level home and has 7 steps each way with rails.  Practiced stairs with KI on for increased support and one crutch opposite side of rail.    Wheelchair Mobility    Modified Rankin (Stroke Patients Only)       Balance                                     Cognition Arousal/Alertness: Awake/alert Behavior During Therapy: WFL for tasks assessed/performed Overall Cognitive Status: Within Functional Limits for tasks assessed                      Exercises   UNI Knee Replacement TE's 10 reps B LE ankle pumps 10 reps towel squeezes 10 reps knee presses 10 reps heel slides  10 reps SAQ's 10 reps SLR's 10 reps ABD Followed by ICE     General Comments        Pertinent Vitals/Pain Pain Assessment: 0-10 Pain Score: 2  Pain Location: L iknee Pain Descriptors / Indicators: Tightness;Sore Pain Intervention(s): Monitored during session;Repositioned;Ice applied    Home Living                      Prior Function            PT Goals (current goals can now be found in the care plan section) Progress towards PT goals: Progressing toward goals    Frequency  7X/week    PT Plan      Co-evaluation             End of Session Equipment Utilized During Treatment: Left knee immobilizer;Gait belt Activity Tolerance: Patient tolerated treatment well Patient left: in bed;with call bell/phone within reach;with family/visitor present     Time: 0910-0950 PT Time Calculation (min) (ACUTE ONLY): 40 min  Charges:  $Gait Training: 8-22 mins $Therapeutic Exercise: 8-22 mins $Therapeutic Activity: 8-22 mins                    G Codes:      Rica Koyanagi  PTA [facility]  Acute  Rehab Pager      [phone]

## 2015-06-02 NOTE — Progress Notes (Signed)
   Subjective: 1 Day Post-Op Procedure(s) (LRB): LEFT KNEE MEDIAL UNICOMPARTMENTAL ARTHROPLASTY (Left) Patient reports pain as mild.   Patient seen in rounds with Dr. Wynelle Link.  Not much rest last night but pain is well controlled. Patient is well, but has had some minor complaints of pain in the knee, requiring pain medications Patient is ready to go home following therapy this morning.  Objective: Vital signs in last 24 hours: Temp:  [97.4 F (36.3 C)-98.3 F (36.8 C)] 98.1 F (36.7 C) (07/12 0529) Pulse Rate:  [48-69] 55 (07/12 0529) Resp:  [14-19] 16 (07/12 0529) BP: (100-157)/(56-91) 157/67 mmHg (07/12 0529) SpO2:  [97 %-100 %] 98 % (07/12 0529)  Intake/Output from previous day:  Intake/Output Summary (Last 24 hours) at 06/02/15 0746 Last data filed at 06/02/15 0736  Gross per 24 hour  Intake   4455 ml  Output   2875 ml  Net   1580 ml    Intake/Output this shift: Total I/O In: 240 [P.O.:240] Out: -   Labs:  Recent Labs  06/02/15 0415  HGB 13.5    Recent Labs  06/02/15 0415  WBC 15.9*  RBC 4.55  HCT 40.8  PLT 192    Recent Labs  06/02/15 0415  NA 136  K 5.0  CL 102  CO2 27  BUN 17  CREATININE 0.99  GLUCOSE 140*  CALCIUM 8.7*   No results for input(s): LABPT, INR in the last 72 hours.  EXAM: General - Patient is Alert, Appropriate and Oriented Extremity - Neurovascular intact Sensation intact distally Dorsiflexion/Plantar flexion intact Dressing - clean, dry Motor Function - intact, moving foot and toes well on exam.  HV pulled without difficulty.  Assessment/Plan: 1 Day Post-Op Procedure(s) (LRB): LEFT KNEE MEDIAL UNICOMPARTMENTAL ARTHROPLASTY (Left) Procedure(s) (LRB): LEFT KNEE MEDIAL UNICOMPARTMENTAL ARTHROPLASTY (Left) Past Medical History  Diagnosis Date  . CAD (coronary artery disease)   . HTN (hypertension)   . Hyperlipidemia   . Prostate cancer   . NSTEMI (non-ST elevated myocardial infarction) December 2011  . Hx of  CABG     December, 2011, [name]  . Ejection fraction     EF 60% by history  . Prostate cancer     Radical prostatectomy approximately 2008  . Neck pain     Left neck pain, musculoskeletal, March, 2013  . Murmur     2/6 crescendo decrescendo systolic murmur  . Abnormal Doppler ultrasound of carotid artery   . Doppler ultrasound of carotid artery   . History of kidney stones   . Pain     LEFT KNEE - MENISCAL TEAR  . Arthritis    Principal Problem:   OA (osteoarthritis) of knee  Estimated body mass index is 34.07 kg/(m^2) as calculated from the following:   Height as of this encounter: 5\' 8"  (1.727 m).   Weight as of this encounter: 101.606 kg (224 lb). Advance diet Up with therapy Discharge home with home health Diet - Cardiac diet Follow up - in 2 weeks Activity - WBAT Disposition - Home Condition Upon Discharge - Good D/C Meds - See DC Summary DVT Prophylaxis - Xarelto  Arlee Muslim, PA-C Orthopaedic Surgery 06/02/2015, 7:46 AM

## 2015-06-02 NOTE — Discharge Summary (Signed)
Physician Discharge Summary   Patient ID: Ralph [name] MRN: [id number] DOB/AGE: Apr 16, 1951 64 y.o.  Admit date: 06/01/2015 Discharge date: 06-02-2015  Primary Diagnosis:  Medial compartment osteoarthritis, Left knee  Admission Diagnoses:  Past Medical History  Diagnosis Date  . CAD (coronary artery disease)   . HTN (hypertension)   . Hyperlipidemia   . Prostate cancer   . NSTEMI (non-ST elevated myocardial infarction) December 2011  . Hx of CABG     December, 2011, [name]  . Ejection fraction     EF 60% by history  . Prostate cancer     Radical prostatectomy approximately 2008  . Neck pain     Left neck pain, musculoskeletal, March, 2013  . Murmur     2/6 crescendo decrescendo systolic murmur  . Abnormal Doppler ultrasound of carotid artery   . Doppler ultrasound of carotid artery   . History of kidney stones   . Pain     LEFT KNEE - MENISCAL TEAR  . Arthritis    Discharge Diagnoses:   Principal Problem:   OA (osteoarthritis) of knee  Estimated body mass index is 34.07 kg/(m^2) as calculated from the following:   Height as of this encounter: 5' 8"  (1.727 m).   Weight as of this encounter: 101.606 kg (224 lb).  Procedure:  Procedure(s) (LRB): LEFT KNEE MEDIAL UNICOMPARTMENTAL ARTHROPLASTY (Left)   Consults: None  HPI: Ralph [name] is a 64 y.o. male, who has  significant isolated medial compartment arthritis of the Left knee. He has had nonoperative management including injections. He has had  cortisone and viscous supplements. Unfortunately, the pain persists.  Radiograph showed isolated medial compartment bone-on-bone arthritis  with normal-appearing patellofemoral and lateral compartments. He  presents now for left knee unicompartmental arthroplasty.   Laboratory Data: Admission on 06/01/2015  Component Date Value Ref Range Status  . ABO/RH(D) 06/01/2015 O NEG   Final  . Antibody Screen 06/01/2015 NEG   Final  . Sample Expiration 06/01/2015  06/04/2015   Final  . WBC 06/02/2015 15.9* 4.0 - 10.5 K/uL Final  . RBC 06/02/2015 4.55  4.22 - 5.81 MIL/uL Final  . Hemoglobin 06/02/2015 13.5  13.0 - 17.0 g/dL Final  . HCT 06/02/2015 40.8  39.0 - 52.0 % Final  . MCV 06/02/2015 89.7  78.0 - 100.0 fL Final  . MCH 06/02/2015 29.7  26.0 - 34.0 pg Final  . MCHC 06/02/2015 33.1  30.0 - 36.0 g/dL Final  . RDW 06/02/2015 12.2  11.5 - 15.5 % Final  . Platelets 06/02/2015 192  150 - 400 K/uL Final  . Sodium 06/02/2015 136  135 - 145 mmol/L Final  . Potassium 06/02/2015 5.0  3.5 - 5.1 mmol/L Final  . Chloride 06/02/2015 102  101 - 111 mmol/L Final  . CO2 06/02/2015 27  22 - 32 mmol/L Final  . Glucose, Bld 06/02/2015 140* 65 - 99 mg/dL Final  . BUN 06/02/2015 17  6 - 20 mg/dL Final  . Creatinine, Ser 06/02/2015 0.99  0.61 - 1.24 mg/dL Final  . Calcium 06/02/2015 8.7* 8.9 - 10.3 mg/dL Final  . GFR calc non Af Amer 06/02/2015 >60  >60 mL/min Final  . GFR calc Af Amer 06/02/2015 >60  >60 mL/min Final   Comment: (NOTE) The eGFR has been calculated using the CKD EPI equation. This calculation has not been validated in all clinical situations. eGFR's persistently <60 mL/min signify possible Chronic Kidney Disease.   . Anion gap 06/02/2015 7  5 - 15  Final  Hospital Outpatient Visit on 05/20/2015  Component Date Value Ref Range Status  . aPTT 05/20/2015 29  24 - 37 seconds Final  . WBC 05/20/2015 6.4  4.0 - 10.5 K/uL Final  . RBC 05/20/2015 4.91  4.22 - 5.81 MIL/uL Final  . Hemoglobin 05/20/2015 14.8  13.0 - 17.0 g/dL Final  . HCT 05/20/2015 44.2  39.0 - 52.0 % Final  . MCV 05/20/2015 90.0  78.0 - 100.0 fL Final  . MCH 05/20/2015 30.1  26.0 - 34.0 pg Final  . MCHC 05/20/2015 33.5  30.0 - 36.0 g/dL Final  . RDW 05/20/2015 12.3  11.5 - 15.5 % Final  . Platelets 05/20/2015 201  150 - 400 K/uL Final  . Sodium 05/20/2015 141  135 - 145 mmol/L Final  . Potassium 05/20/2015 4.5  3.5 - 5.1 mmol/L Final  . Chloride 05/20/2015 103  101 - 111 mmol/L  Final  . CO2 05/20/2015 30  22 - 32 mmol/L Final  . Glucose, Bld 05/20/2015 77  65 - 99 mg/dL Final  . BUN 05/20/2015 17  6 - 20 mg/dL Final  . Creatinine, Ser 05/20/2015 1.04  0.61 - 1.24 mg/dL Final  . Calcium 05/20/2015 9.6  8.9 - 10.3 mg/dL Final  . Total Protein 05/20/2015 7.3  6.5 - 8.1 g/dL Final  . Albumin 05/20/2015 4.5  3.5 - 5.0 g/dL Final  . AST 05/20/2015 21  15 - 41 U/L Final  . ALT 05/20/2015 19  17 - 63 U/L Final  . Alkaline Phosphatase 05/20/2015 77  38 - 126 U/L Final  . Total Bilirubin 05/20/2015 0.6  0.3 - 1.2 mg/dL Final  . GFR calc non Af Amer 05/20/2015 >60  >60 mL/min Final  . GFR calc Af Amer 05/20/2015 >60  >60 mL/min Final   Comment: (NOTE) The eGFR has been calculated using the CKD EPI equation. This calculation has not been validated in all clinical situations. eGFR's persistently <60 mL/min signify possible Chronic Kidney Disease.   . Anion gap 05/20/2015 8  5 - 15 Final  . Prothrombin Time 05/20/2015 13.5  11.6 - 15.2 seconds Final  . INR 05/20/2015 1.01  0.00 - 1.49 Final  . Color, Urine 05/20/2015 YELLOW  YELLOW Final  . APPearance 05/20/2015 CLEAR  CLEAR Final  . Specific Gravity, Urine 05/20/2015 1.030  1.005 - 1.030 Final  . pH 05/20/2015 5.5  5.0 - 8.0 Final  . Glucose, UA 05/20/2015 NEGATIVE  NEGATIVE mg/dL Final  . Hgb urine dipstick 05/20/2015 NEGATIVE  NEGATIVE Final  . Bilirubin Urine 05/20/2015 NEGATIVE  NEGATIVE Final  . Ketones, ur 05/20/2015 NEGATIVE  NEGATIVE mg/dL Final  . Protein, ur 05/20/2015 NEGATIVE  NEGATIVE mg/dL Final  . Urobilinogen, UA 05/20/2015 0.2  0.0 - 1.0 mg/dL Final  . Nitrite 05/20/2015 NEGATIVE  NEGATIVE Final  . Leukocytes, UA 05/20/2015 NEGATIVE  NEGATIVE Final   MICROSCOPIC NOT DONE ON URINES WITH NEGATIVE PROTEIN, BLOOD, LEUKOCYTES, NITRITE, OR GLUCOSE <1000 mg/dL.  Marland Kitchen MRSA, PCR 05/20/2015 NEGATIVE  NEGATIVE Final  . Staphylococcus aureus 05/20/2015 NEGATIVE  NEGATIVE Final   Comment:        The Xpert SA  Assay (FDA approved for NASAL specimens in patients over 37 years of age), is one component of a comprehensive surveillance program.  Test performance has been validated by [facility] for patients greater than or equal to 6 year old. It is not intended to diagnose infection nor to guide or monitor treatment.  X-Rays:No results found.  EKG: Orders placed or performed in visit on 03/12/15  . EKG 12-Lead     Hospital Course: Ralph [name] is a 64 y.o. who was admitted to [facility]. They were brought to the operating room on 06/01/2015 and underwent Procedure(s): LEFT KNEE MEDIAL UNICOMPARTMENTAL ARTHROPLASTY.  Patient tolerated the procedure well and was later transferred to the recovery room and then to the orthopaedic floor for postoperative care.  They were given PO and IV analgesics for pain control following their surgery.  They were given 24 hours of postoperative antibiotics of  Anti-infectives    Start     Dose/Rate Route Frequency Ordered Stop   06/01/15 1400  ceFAZolin (ANCEF) IVPB 2 g/50 mL premix     2 g 100 mL/hr over 30 Minutes Intravenous Every 6 hours 06/01/15 1055 06/01/15 1955   06/01/15 0509  ceFAZolin (ANCEF) IVPB 2 g/50 mL premix  Status:  Discontinued     2 g 100 mL/hr over 30 Minutes Intravenous On call to O.R. 06/01/15 0509 06/01/15 1045     and started on DVT prophylaxis in the form of Xarelto.   PT and OT were ordered for total joint protocol.  Discharge planning consulted to help with postop disposition and equipment needs.  Patient had a good night on the evening of surgery.  They started to get up OOB with therapy on day one. Hemovac drain was pulled without difficulty.  Patient was seen in rounds by Dr. Wynelle Link and it was felt that as long as they did well with therapy that they would be ready to go home.  Discharge home with home health Diet - Cardiac diet Follow up - in 2 weeks Activity - WBAT Disposition - Home Condition Upon  Discharge - Good D/C Meds - See DC Summary DVT Prophylaxis - Xarelto      Discharge Instructions    Call MD / Call 911    Complete by:  As directed   If you experience chest pain or shortness of breath, CALL 911 and be transported to the hospital emergency room.  If you develope a fever above 101 F, pus (white drainage) or increased drainage or redness at the wound, or calf pain, call your surgeon's office.     Change dressing    Complete by:  As directed   May remove the postop dressing tomorrow, Wed 06/03/2015 and then apply a dry guaze dressing to the incision with gauze and paper tape. Change dressing daily with sterile 4 x 4 inch gauze dressing and apply TED hose. Do not submerge the incision under water.     Constipation Prevention    Complete by:  As directed   Drink plenty of fluids.  Prune juice may be helpful.  You may use a stool softener, such as Colace (over the counter) 100 mg twice a day.  Use MiraLax (over the counter) for constipation as needed.     Diet - low sodium heart healthy    Complete by:  As directed      Discharge instructions    Complete by:  As directed   Pick up stool softner and laxative for home use following surgery while on pain medications. Do not submerge incision under water. Please use good hand washing techniques while changing dressing each day. May shower starting three days after surgery. Please use a clean towel to pat the incision dry following showers. Continue to use ice for pain and swelling after surgery. Do  not use any lotions or creams on the incision until instructed by your surgeon.  Xarelto 10 mg daily for ten days, then change to Aspirin 325 mg daily for two weeks, then reduce to Baby Aspirin 81 mg daily.  Postoperative Constipation Protocol  Constipation - defined medically as fewer than three stools per week and severe constipation as less than one stool per week.  One of the most common issues patients have following surgery is  constipation.  Even if you have a regular bowel pattern at home, your normal regimen is likely to be disrupted due to multiple reasons following surgery.  Combination of anesthesia, postoperative narcotics, change in appetite and fluid intake all can affect your bowels.  In order to avoid complications following surgery, here are some recommendations in order to help you during your recovery period.  Colace (docusate) - Pick up an over-the-counter form of Colace or another stool softener and take twice a day as long as you are requiring postoperative pain medications.  Take with a full glass of water daily.  If you experience loose stools or diarrhea, hold the colace until you stool forms back up.  If your symptoms do not get better within 1 week or if they get worse, check with your doctor.  Dulcolax (bisacodyl) - Pick up over-the-counter and take as directed by the product packaging as needed to assist with the movement of your bowels.  Take with a full glass of water.  Use this product as needed if not relieved by Colace only.   MiraLax (polyethylene glycol) - Pick up over-the-counter to have on hand.  MiraLax is a solution that will increase the amount of water in your bowels to assist with bowel movements.  Take as directed and can mix with a glass of water, juice, soda, coffee, or tea.  Take if you go more than two days without a movement. Do not use MiraLax more than once per day. Call your doctor if you are still constipated or irregular after using this medication for 7 days in a row.  If you continue to have problems with postoperative constipation, please contact the office for further assistance and recommendations.  If you experience "the worst abdominal pain ever" or develop nausea or vomiting, please contact the office immediatly for further recommendations for treatment.     Do not put a pillow under the knee. Place it under the heel.    Complete by:  As directed      Do not sit on low  chairs, stoools or toilet seats, as it may be difficult to get up from low surfaces    Complete by:  As directed      Driving restrictions    Complete by:  As directed   No driving until released by the physician.     Increase activity slowly as tolerated    Complete by:  As directed      Lifting restrictions    Complete by:  As directed   No lifting until released by the physician.     Patient may shower    Complete by:  As directed   You may shower without a dressing once there is no drainage.  Do not wash over the wound.  If drainage remains, do not shower until drainage stops.     TED hose    Complete by:  As directed   Use stockings (TED hose) for 3 weeks on both leg(s).  You may remove them at night  for sleeping.     Weight bearing as tolerated    Complete by:  As directed   Laterality:  left  Extremity:  Lower            Medication List    STOP taking these medications        aspirin 81 MG tablet     FISH OIL TRIPLE STRENGTH 1400 MG Caps     naproxen sodium 220 MG tablet  Commonly known as:  ANAPROX      TAKE these medications        atorvastatin 40 MG tablet  Commonly known as:  LIPITOR  Take 1 tablet (40 mg total) by mouth every morning.     lisinopril 5 MG tablet  Commonly known as:  PRINIVIL,ZESTRIL  Take 1 tablet (5 mg total) by mouth every morning.     methocarbamol 500 MG tablet  Commonly known as:  ROBAXIN  Take 1 tablet (500 mg total) by mouth every 6 (six) hours as needed for muscle spasms.     metoprolol tartrate 25 MG tablet  Commonly known as:  LOPRESSOR  Take 1 tablet (25 mg total) by mouth 2 (two) times daily.     oxyCODONE 5 MG immediate release tablet  Commonly known as:  Oxy IR/ROXICODONE  Take 1-2 tablets (5-10 mg total) by mouth every 3 (three) hours as needed for moderate pain or severe pain.     rivaroxaban 10 MG Tabs tablet  Commonly known as:  XARELTO  Take 1 tablet (10 mg total) by mouth daily with breakfast. Xarelto 10 mg  daily for ten days, then change to Aspirin 325 mg daily for two weeks, then reduce to Baby Aspirin 81 mg daily.     traMADol 50 MG tablet  Commonly known as:  ULTRAM  Take 1-2 tablets (50-100 mg total) by mouth every 6 (six) hours as needed (mild pain).       Follow-up Information    Follow up with Gearlean Alf, MD. Schedule an appointment as soon as possible for a visit in 2 weeks.   Specialty:  Orthopedic Surgery   Why:  Call office at [phone] to setup appointment with the PA the week of July 25th.   Contact information:   [address] [place] [address] [phone]       Signed: Arlee Muslim, PA-C Orthopaedic Surgery 06/02/2015, 8:04 AM

## 2015-06-02 NOTE — Care Management Note (Signed)
Case Management Note  Patient Details  Name: Ralph [name] MRN: [id number] Date of Birth: 1951-02-13  Subjective/Objective:                   LEFT KNEE MEDIAL UNICOMPARTMENTAL ARTHROPLASTY (Left) Action/Plan:  Discharge planning Expected Discharge Date:  7/09/23/15               Expected Discharge Plan:  [place]  In-House Referral:     Discharge planning Services  CM Consult  Post Acute Care Choice:  Home Health Choice offered to:  Patient  DME Arranged:  Walker rolling DME Agency:  [place]:  PT [facility] Agency:  [place]  Status of Service:  Completed, signed off  Medicare Important Message Given:    Date Medicare IM Given:    Medicare IM give by:    Date Additional Medicare IM Given:    Additional Medicare Important Message give by:     If discussed at [place] of Stay Meetings, dates discussed:    Additional Comments: CM met with pt in room to offer choice of home health agency.  Pt chooses [facility] to render HHPT.  Address and contact information verified with pt.  Referral given to [employer], Tim.  CM called [facility] DME rep, lecretia to please deliver the rolling walker to room so pt can discharge.  No there CM needs were communicated. Dellie Catholic, RN 06/02/2015, 10:00 AM

## 2015-06-02 NOTE — Discharge Instructions (Addendum)
° °Dr. Frank Aluisio °Total Joint Specialist °[facility] °[address] °, [place] [address] °[phone] ° °UNI KNEE REPLACEMENT POSTOPERATIVE DIRECTIONS ° ° °Knee Rehabilitation, Guidelines Following Surgery  °Results after knee surgery are often greatly improved when you follow the exercise, range of motion and muscle strengthening exercises prescribed by your doctor. Safety measures are also important to protect the knee from further injury. Any time any of these exercises cause you to have increased pain or swelling in your knee joint, decrease the amount until you are comfortable again and slowly increase them. If you have problems or questions, call your caregiver or physical therapist for advice.  ° °HOME CARE INSTRUCTIONS  °Remove items at home which could result in a fall. This includes throw rugs or furniture in walking pathways.  °· ICE to the affected knee every three hours for 30 minutes at a time and then as needed for pain and swelling.  Continue to use ice on the knee for pain and swelling from surgery. You may notice swelling that will progress down to the foot and ankle.  This is normal after surgery.  Elevate the leg when you are not up walking on it.   °· Continue to use the breathing machine which will help keep your temperature down.  It is common for your temperature to cycle up and down following surgery, especially at night when you are not up moving around and exerting yourself.  The breathing machine keeps your lungs expanded and your temperature down. °· Do not place pillow under knee, focus on keeping the knee straight while resting ° °DIET °You may resume your previous home diet once your are discharged from the hospital. ° °DRESSING / WOUND CARE / SHOWERING °You may shower 3 days after surgery, but keep the wounds dry during showering.  You may use an occlusive plastic wrap (Press'n Seal for example), NO SOAKING/SUBMERGING IN THE BATHTUB.  If the  bandage gets wet, change with a clean dry gauze.  If the incision gets wet, pat the wound dry with a clean towel. °You may start showering once you are discharged home but do not submerge the incision under water. Just pat the incision dry and apply a dry gauze dressing on daily. °Change the surgical dressing daily and reapply a dry dressing each time. ° °ACTIVITY °Walk with your walker as instructed. °Use walker as long as suggested by your caregivers. °Avoid periods of inactivity such as sitting longer than an hour when not asleep. This helps prevent blood clots.  °You may resume a sexual relationship in one month or when given the OK by your doctor.  °You may return to work once you are cleared by your doctor.  °Do not drive a car for 6 weeks or until released by you surgeon.  °Do not drive while taking narcotics. ° °WEIGHT BEARING °Weight bearing as tolerated with assist device (walker, cane, etc) as directed, use it as long as suggested by your surgeon or therapist, typically at least 4-6 weeks. ° °POSTOPERATIVE CONSTIPATION PROTOCOL °Constipation - defined medically as fewer than three stools per week and severe constipation as less than one stool per week. ° °One of the most common issues patients have following surgery is constipation.  Even if you have a regular bowel pattern at home, your normal regimen is likely to be disrupted due to multiple reasons following surgery.  Combination of anesthesia, postoperative narcotics, change in appetite and fluid intake all can affect your bowels.    In order to avoid complications following surgery, here are some recommendations in order to help you during your recovery period.  Colace (docusate) - Pick up an over-the-counter form of Colace or another stool softener and take twice a day as long as you are requiring postoperative pain medications.  Take with a full glass of water daily.  If you experience loose stools or diarrhea, hold the colace until you stool forms  back up.  If your symptoms do not get better within 1 week or if they get worse, check with your doctor.  Dulcolax (bisacodyl) - Pick up over-the-counter and take as directed by the product packaging as needed to assist with the movement of your bowels.  Take with a full glass of water.  Use this product as needed if not relieved by Colace only.   MiraLax (polyethylene glycol) - Pick up over-the-counter to have on hand.  MiraLax is a solution that will increase the amount of water in your bowels to assist with bowel movements.  Take as directed and can mix with a glass of water, juice, soda, coffee, or tea.  Take if you go more than two days without a movement. Do not use MiraLax more than once per day. Call your doctor if you are still constipated or irregular after using this medication for 7 days in a row.  If you continue to have problems with postoperative constipation, please contact the office for further assistance and recommendations.  If you experience "the worst abdominal pain ever" or develop nausea or vomiting, please contact the office immediatly for further recommendations for treatment.  ITCHING  If you experience itching with your medications, try taking only a single pain pill, or even half a pain pill at a time.  You can also use Benadryl over the counter for itching or also to help with sleep.   TED HOSE STOCKINGS Wear the elastic stockings on both legs for three weeks following surgery during the day but you may remove then at night for sleeping.  MEDICATIONS See your medication summary on the After Visit Summary that the nursing staff will review with you prior to discharge.  You may have some home medications which will be placed on hold until you complete the course of blood thinner medication.  It is important for you to complete the blood thinner medication as prescribed by your surgeon.  Continue your approved medications as instructed at time of  discharge.  PRECAUTIONS If you experience chest pain or shortness of breath - call 911 immediately for transfer to the hospital emergency department.  If you develop a fever greater that 101 F, purulent drainage from wound, increased redness or drainage from wound, foul odor from the wound/dressing, or calf pain - CONTACT YOUR SURGEON.                                                   FOLLOW-UP APPOINTMENTS Make sure you keep all of your appointments after your operation with your surgeon and caregivers. You should call the office at the above phone number and make an appointment for approximately two weeks after the date of your surgery or on the date instructed by your surgeon outlined in the "After Visit Summary".  RANGE OF MOTION AND STRENGTHENING EXERCISES  Rehabilitation of the knee is important following a knee injury or an  operation. After just a few days of immobilization, the muscles of the thigh which control the knee become weakened and shrink (atrophy). Knee exercises are designed to build up the tone and strength of the thigh muscles and to improve knee motion. Often times heat used for twenty to thirty minutes before working out will loosen up your tissues and help with improving the range of motion but do not use heat for the first two weeks following surgery. These exercises can be done on a training (exercise) mat, on the floor, on a table or on a bed. Use what ever works the best and is most comfortable for you Knee exercises include:  Leg Lifts - While your knee is still immobilized in a splint or cast, you can do straight leg raises. Lift the leg to 60 degrees, hold for 3 sec, and slowly lower the leg. Repeat 10-20 times 2-3 times daily. Perform this exercise against resistance later as your knee gets better.  Quad and Hamstring Sets - Tighten up the muscle on the front of the thigh (Quad) and hold for 5-10 sec. Repeat this 10-20 times hourly. Hamstring sets are done by pushing the  foot backward against an object and holding for 5-10 sec. Repeat as with quad sets.   Leg Slides: Lying on your back, slowly slide your foot toward your buttocks, bending your knee up off the floor (only go as far as is comfortable). Then slowly slide your foot back down until your leg is flat on the floor again.  Angel Wings: Lying on your back spread your legs to the side as far apart as you can without causing discomfort.  A rehabilitation program following serious knee injuries can speed recovery and prevent re-injury in the future due to weakened muscles. Contact your doctor or a physical therapist for more information on knee rehabilitation.   IF YOU ARE TRANSFERRED TO A SKILLED REHAB FACILITY If the patient is transferred to a skilled rehab facility following release from the hospital, a list of the current medications will be sent to the facility for the patient to continue.  When discharged from the skilled rehab facility, please have the facility set up the patient's [place] prior to being released. Also, the skilled facility will be responsible for providing the patient with their medications at time of release from the facility to include their pain medication, the muscle relaxants, and their blood thinner medication. If the patient is still at the rehab facility at time of the two week follow up appointment, the skilled rehab facility will also need to assist the patient in arranging follow up appointment in our office and any transportation needs.  MAKE SURE YOU:  Understand these instructions.  Get help right away if you are not doing well or get worse.    Pick up stool softner and laxative for home use following surgery while on pain medications. Do not submerge incision under water. Please use good hand washing techniques while changing dressing each day. May shower starting three days after surgery. Please use a clean towel to pat the incision dry following  showers. Continue to use ice for pain and swelling after surgery. Do not use any lotions or creams on the incision until instructed by your surgeon.   Take Xarelto 10 mg daily for ten days, then change to Aspirin 325 mg daily for two weeks, then reduce to Baby Aspirin 81 mg daily.   Information on my medicine - XARELTO (Rivaroxaban)  This medication education was reviewed with me or my healthcare representative as part of my discharge preparation.  The pharmacist that spoke with me during my hospital stay was:  Luiz Ochoa [facility]  Why was Xarelto prescribed for you? Xarelto was prescribed for you to reduce the risk of blood clots forming after orthopedic surgery. The medical term for these abnormal blood clots is venous thromboembolism (VTE).  What do you need to know about xarelto ? Take your Xarelto ONCE DAILY at the same time every day. You may take it either with or without food.  If you have difficulty swallowing the tablet whole, you may crush it and mix in applesauce just prior to taking your dose.  Take Xarelto exactly as prescribed by your doctor and DO NOT stop taking Xarelto without talking to the doctor who prescribed the medication.  Stopping without other VTE prevention medication to take the place of Xarelto may increase your risk of developing a clot.  After discharge, you should have regular check-up appointments with your healthcare provider that is prescribing your Xarelto.    What do you do if you miss a dose? If you miss a dose, take it as soon as you remember on the same day then continue your regularly scheduled once daily regimen the next day. Do not take two doses of Xarelto on the same day.   Important Safety Information A possible side effect of Xarelto is bleeding. You should call your healthcare provider right away if you experience any of the following: ? Bleeding from an injury or your nose that does not stop. ? Unusual colored urine (red or  dark brown) or unusual colored stools (red or black). ? Unusual bruising for unknown reasons. ? A serious fall or if you hit your head (even if there is no bleeding).  Some medicines may interact with Xarelto and might increase your risk of bleeding while on Xarelto. To help avoid this, consult your healthcare provider or pharmacist prior to using any new prescription or non-prescription medications, including herbals, vitamins, non-steroidal anti-inflammatory drugs (NSAIDs) and supplements.  This website has more information on Xarelto: [email or web address]

## 2015-06-02 NOTE — Evaluation (Signed)
Occupational Therapy Evaluation Patient Details Name: Ralph [name] MRN: [id number] DOB: 1951/07/16 Today's Date: 06/02/2015    History of Present Illness L [facility]   Clinical Impression   This 64 year old man was admitted for the above surgery. All education was completed.  No further OT is needed at this time.    Follow Up Recommendations  No OT follow up    Equipment Recommendations  None recommended by OT (pt wants to try standard commode; will let HHPT know if he needs 3:1 )    Recommendations for Other Services       Precautions / Restrictions Precautions Precautions: Fall;Knee Required Braces or Orthoses: Knee Immobilizer - Left Restrictions Weight Bearing Restrictions: No      Mobility Bed Mobility         Supine to sit: Min assist     General bed mobility comments: light min A for LLE  Transfers   Equipment used: Rolling walker (2 wheeled) Transfers: Sit to/from Stand Sit to Stand: Min guard         General transfer comment: cues for UE/LE placement    Balance                                            ADL Overall ADL's : Needs assistance/impaired     Grooming: Oral care;Supervision/safety;Standing                   Toilet Transfer: Min guard;Ambulation;Comfort height toilet       Tub/ Shower Transfer: Walk-in shower;Min guard;Ambulation     General ADL Comments: Pt states wife will assist with adls as needed; not interested in AE.  He wants to try his standard commode at home:  pt is strong and has a sink next to it.  Cues for safety with walker--not to lift up around obstacles and to keep it in front of him when turning towards sink     Vision     Perception     Praxis      Pertinent Vitals/Pain Pain Score: 2  Pain Location: L knee Pain Descriptors / Indicators: Sore Pain Intervention(s): Limited activity within patient's tolerance;Monitored during session;Premedicated before session;Repositioned;Ice  applied     Hand Dominance     Extremity/Trunk Assessment Upper Extremity Assessment Upper Extremity Assessment: Overall WFL for tasks assessed           Communication Communication Communication: No difficulties   Cognition Arousal/Alertness: Awake/alert Behavior During Therapy: WFL for tasks assessed/performed Overall Cognitive Status: Within Functional Limits for tasks assessed                     General Comments       Exercises       Shoulder Instructions      Home Living Family/patient expects to be discharged to:: Private residence Living Arrangements: Spouse/significant other                 Bathroom Shower/Tub: Occupational psychologist: Standard     Home Equipment: Crutches          Prior Functioning/Environment Level of Independence: Independent             OT Diagnosis: Generalized weakness   OT Problem List:     OT Treatment/Interventions:      OT Goals(Current goals can be found in  the care plan section) Acute Rehab OT Goals Patient Stated Goal: to go home  OT Frequency:     Barriers to D/C:            Co-evaluation              End of Session    Activity Tolerance: Patient tolerated treatment well Patient left: in bed   Time: 3403-5248 OT Time Calculation (min): 22 min Charges:  OT General Charges $OT Visit: 1 Procedure OT Evaluation $Initial OT Evaluation Tier I: 1 Procedure G-Codes:    Erlene Devita 19-Jun-2015, 8:55 AM  Lesle Chris, OTR/L [phone] 06/19/15

## 2015-06-03 NOTE — Progress Notes (Signed)
   06/02/15 0800  OT G-codes **NOT FOR INPATIENT CLASS**  Functional Assessment Tool Used clinical observation/judgment  Functional Limitation Self care  Self Care Current Status [phone]) CI  Self Care Goal Status (P1003) CI  Self Care Discharge Status [phone]) CI  Lesle Chris, OTR/L [phone] 06/03/2015

## 2015-08-31 ENCOUNTER — Other Ambulatory Visit: Payer: Self-pay | Admitting: Cardiology

## 2015-11-07 ENCOUNTER — Other Ambulatory Visit: Payer: Self-pay | Admitting: Cardiology

## 2016-01-15 ENCOUNTER — Ambulatory Visit (INDEPENDENT_AMBULATORY_CARE_PROVIDER_SITE_OTHER): Payer: Medicare Other | Admitting: Urology

## 2016-01-15 DIAGNOSIS — N5201 Erectile dysfunction due to arterial insufficiency: Secondary | ICD-10-CM

## 2016-01-15 DIAGNOSIS — Z8546 Personal history of malignant neoplasm of prostate: Secondary | ICD-10-CM

## 2016-01-15 DIAGNOSIS — N393 Stress incontinence (female) (male): Secondary | ICD-10-CM

## 2016-01-15 DIAGNOSIS — Z87442 Personal history of urinary calculi: Secondary | ICD-10-CM | POA: Diagnosis not present

## 2016-01-15 DIAGNOSIS — R3121 Asymptomatic microscopic hematuria: Secondary | ICD-10-CM

## 2016-03-03 ENCOUNTER — Other Ambulatory Visit: Payer: Self-pay | Admitting: Cardiovascular Disease

## 2016-03-15 ENCOUNTER — Ambulatory Visit (INDEPENDENT_AMBULATORY_CARE_PROVIDER_SITE_OTHER): Payer: Medicare Other | Admitting: Cardiology

## 2016-03-15 ENCOUNTER — Encounter: Payer: Self-pay | Admitting: Cardiology

## 2016-03-15 VITALS — BP 124/70 | HR 52 | Ht 68.0 in | Wt 200.0 lb

## 2016-03-15 DIAGNOSIS — I10 Essential (primary) hypertension: Secondary | ICD-10-CM | POA: Diagnosis not present

## 2016-03-15 DIAGNOSIS — I2510 Atherosclerotic heart disease of native coronary artery without angina pectoris: Secondary | ICD-10-CM

## 2016-03-15 DIAGNOSIS — E785 Hyperlipidemia, unspecified: Secondary | ICD-10-CM

## 2016-03-15 NOTE — Patient Instructions (Signed)
Continue all current medications. Your physician wants you to follow up in:  1 year.  You will receive a reminder letter in the mail one-two months in advance.  If you don't receive a letter, please call our office to schedule the follow up appointment   

## 2016-03-15 NOTE — Progress Notes (Signed)
Cardiology Office Note  Date: 03/15/2016   ID: HARIHARAN KLOOS, DOB [birth year], MRN [id number]  PCP: Deloria Lair, MD  Primary Cardiologist: Rozann Lesches, MD   Chief Complaint  Patient presents with  . Coronary Artery Disease    History of Present Illness: Ralph [name] is a 65 y.o. male former patient Dr. Ron Parker, now establishing follow-up with me. This is our first meeting in the office. I reviewed records and updated his chart. He presents for routine evaluation. In the last 6 months he has been focusing on weight loss, diet and exercise. By our scales his weight is down 25 pounds. He has been walking with his wife, also plays golf. He has cut back on carbohydrates. States he feels much better.  I reviewed his medications. Cardiac regimen includes aspirin, Lipitor, lisinopril, and Lopressor. He has not required any nitroglycerin. ECG today reviewed showing sinus bradycardia with nonspecific ST-T changes.  Most recent echocardiogram from 2015 is reviewed below. He has not undergone ischemic testing since CABG in 2011. We discussed follow-up ischemic testing, for now have decided to continue with observation since he has clinically been quite stable and feels well with activities exceeding 4 METs.  He follows with Dr. Scotty Court soon for repeat lab work and physical.  Past Medical History  Diagnosis Date  . CAD (coronary artery disease)     Multivessel status post CABG December 2011 - Dr. [name]  . Essential hypertension   . Hyperlipidemia   . NSTEMI (non-ST elevated myocardial infarction) [facility]) December 2011  . Prostate cancer [facility])     Radical prostatectomy  . Neck pain   . History of kidney stones   . Arthritis     Past Surgical History  Procedure Laterality Date  . Coronary artery bypass graft  December 2011    LIMA to LAD, L Radial to OM2, SVG to DX, SVG to PD  . Prostatectomy  2008  . Lithotripsy      X 2  . Knee arthroscopy Left 12/18/2013    Procedure: LEFT  ARTHROSCOPY KNEE WITH DEBRIDMENT medial and lateral menisectomy condroplasty;  Surgeon: Gearlean Alf, MD;  Location: [facility] ORS;  Service: Orthopedics;  Laterality: Left;  . Tonsillectomy    . Partial knee arthroplasty Left 06/01/2015    Procedure: LEFT KNEE MEDIAL UNICOMPARTMENTAL ARTHROPLASTY;  Surgeon: Gaynelle Arabian, MD;  Location: [facility] ORS;  Service: Orthopedics;  Laterality: Left;    Current Outpatient Prescriptions  Medication Sig Dispense Refill  . aspirin 81 MG tablet Take 81 mg by mouth daily.    Marland Kitchen atorvastatin (LIPITOR) 40 MG tablet TAKE 1 TABLET (40 MG TOTAL) BY MOUTH EVERY MORNING. 30 tablet 6  . lisinopril (PRINIVIL,ZESTRIL) 5 MG tablet TAKE 1 TABLET (5 MG TOTAL) BY MOUTH EVERY MORNING. 30 tablet 3  . metoprolol tartrate (LOPRESSOR) 25 MG tablet Take 1 tablet (25 mg total) by mouth 2 (two) times daily. 180 tablet 3  . Omega-3 Fatty Acids (FISH OIL PO) Take 1,400 mg by mouth.     No current facility-administered medications for this visit.   Allergies:  Review of patient's allergies indicates no known allergies.   Social History: The patient  reports that he quit smoking about 27 years ago. His smoking use included Cigarettes. He has a 8 pack-year smoking history. He has never used smokeless tobacco. He reports that he does not drink alcohol or use illicit drugs.   Family History: The patient's family history includes Alzheimer's disease in his mother; Stroke  in his father.   ROS:  Please see the history of present illness. Otherwise, complete review of systems is positive for chronic knee pain.  All other systems are reviewed and negative.   Physical Exam: VS:  BP 124/70 mmHg  Pulse 52  Ht 5\' 8"  (1.727 m)  Wt 200 lb (90.719 kg)  BMI 30.42 kg/m2  SpO2 97%, BMI Body mass index is 30.42 kg/(m^2).  Wt Readings from Last 3 Encounters:  03/15/16 200 lb (90.719 kg)  06/01/15 224 lb (101.606 kg)  03/12/15 216 lb 1.9 oz (98.031 kg)    General: Patient appears comfortable at  rest. HEENT: Conjunctiva and lids normal, oropharynx clear. Neck: Supple, no elevated JVP or carotid bruits, no thyromegaly. Lungs: Clear to auscultation, nonlabored breathing at rest. Cardiac: Regular rate and rhythm, no S3, soft systolic murmur, no pericardial rub. Abdomen: Soft, nontender, bowel sounds present, no guarding or rebound. Extremities: No pitting edema, distal pulses 2+. Skin: Warm and dry. Musculoskeletal: No kyphosis. Neuropsychiatric: Alert and oriented x3, affect grossly appropriate.  ECG: I personally reviewed the prior tracing from 03/12/2015 which showed sinus bradycardia.  Recent Labwork: 05/20/2015: ALT 19; AST 21 06/02/2015: BUN 17; Creatinine, Ser 0.99; Hemoglobin 13.5; Platelets 192; Potassium 5.0; Sodium 136  March 2016: Triglycerides 87, cholesterol 105, HDL 60, LDL 38, AST 22, ALT 19  Other Studies Reviewed Today:  Echocardiogram 09/25/2014: Study Conclusions  - Left ventricle: The cavity size was normal. Wall thickness was normal. Systolic function was normal. The estimated ejection fraction was in the range of 60% to 65%. Wall motion was normal; there were no regional wall motion abnormalities. Doppler parameters are consistent with abnormal left ventricular relaxation (grade 1 diastolic dysfunction). - Aortic valve: Mildly calcified annulus. Probably trileaflet. There was no significant regurgitation. - Mitral valve: Calcified annulus. Mildly thickened leaflets . There was trivial regurgitation. - Left atrium: The atrium was mildly dilated. - Right atrium: The atrium was mildly dilated. - Tricuspid valve: There was trivial regurgitation. Peak RV-RA gradient (S): 30 mm Hg. - Inferior vena cava: Not visualized. Unable to estimate CVP. - Pericardium, extracardiac: There was no pericardial effusion.  Impressions:  - Normal LV wall thickness and chamber size with LVEF [id number], grade 1 diastolic dysfunction. MAC with mildly thickened  mitral leaflets and trivial mitral regurgitation. Mild left atrial enlargement. Mildly sclerotic aortic valve without obvious stenosis. Trivial tricuspid regurgitation with RV-RA gradient 31 m mercury.  Assessment and Plan:  1. Symptomatically stable multivessel CAD status post CABG in 2011. Follow-up ECG reviewed. For now we will plan to continue observation on medical therapy. We have discussed warning signs and symptoms. Continue to focus on diet and exercise.  2. Essential hypertension, blood pressure is well controlled today.  3. Hyperlipidemia, continues on Lipitor. Due for follow-up lab work per Dr. Scotty Court.  4. Arthritic knee pain, patient status post partial left knee arthroplasty in July of last year. May need to have his right knee operated on eventually. Continues to walk for exercise.  Current medicines were reviewed with the patient today.   Orders Placed This Encounter  Procedures  . EKG 12-Lead    Disposition: FU with me in 1 year.   Signed, Satira Sark, MD, [facility] 03/15/2016 9:11 AM    [facility] [place], [place], [place] [address] Phone: [phone]; Fax: [phone]

## 2016-03-30 ENCOUNTER — Other Ambulatory Visit: Payer: Self-pay | Admitting: Cardiology

## 2016-04-06 ENCOUNTER — Other Ambulatory Visit: Payer: Self-pay | Admitting: *Deleted

## 2016-04-06 MED ORDER — LISINOPRIL 5 MG PO TABS: ORAL_TABLET | ORAL | Status: DC

## 2016-10-21 ENCOUNTER — Other Ambulatory Visit: Payer: Self-pay | Admitting: Cardiology

## 2017-01-06 ENCOUNTER — Encounter: Payer: Self-pay | Admitting: *Deleted

## 2017-01-06 ENCOUNTER — Ambulatory Visit (INDEPENDENT_AMBULATORY_CARE_PROVIDER_SITE_OTHER): Payer: Medicare Other | Admitting: Cardiology

## 2017-01-06 VITALS — BP 148/80 | HR 50 | Ht 68.0 in | Wt 227.0 lb

## 2017-01-06 DIAGNOSIS — I209 Angina pectoris, unspecified: Secondary | ICD-10-CM

## 2017-01-06 DIAGNOSIS — I10 Essential (primary) hypertension: Secondary | ICD-10-CM | POA: Diagnosis not present

## 2017-01-06 DIAGNOSIS — I25119 Atherosclerotic heart disease of native coronary artery with unspecified angina pectoris: Secondary | ICD-10-CM

## 2017-01-06 DIAGNOSIS — E782 Mixed hyperlipidemia: Secondary | ICD-10-CM | POA: Diagnosis not present

## 2017-01-06 DIAGNOSIS — R001 Bradycardia, unspecified: Secondary | ICD-10-CM

## 2017-01-06 NOTE — Progress Notes (Signed)
Cardiology Office Note  Date: 01/06/2017   ID: Ralph [name], DOB 1951-07-17, MRN [id number]  PCP: Deloria Lair, MD  Primary Cardiologist: Rozann Lesches, MD   Chief Complaint  Patient presents with  . Coronary Artery Disease    History of Present Illness: Ralph [name] is a 66 y.o. male that I met back in April 2017, a former patient of Dr. Ron Parker. He presents to the office for routine follow-up, also reports some symptoms in his left arm that are new since I last saw him. States he feels a tingling and aching in his left arm at times, when he is driving and has his arm up on the steering well for a period of time this occurs, also sometimes at nighttime. Not specifically exertional. Not entirely dissimilar from prior angina however.  He has a history of CAD status post CABG in 2011, has not undergone follow-up ischemic testing since that time. We discussed arranging a follow-up stress test. He reports compliance with his medications which are outlined below.  Reviewed his ECG today which showed sinus bradycardia with IVCD consistent with incomplete left bundle-branch block.  State that he had an injury several years ago when he was working on a deck and fell against the left side of his neck. Wonders whether he might have some trouble with disc disease.  Past Medical History:  Diagnosis Date  . Arthritis   . CAD (coronary artery disease)    Multivessel status post CABG December 2011 - Dr. [name]  . Essential hypertension   . History of kidney stones   . Hyperlipidemia   . Neck pain   . NSTEMI (non-ST elevated myocardial infarction) [facility]) December 2011  . Prostate cancer [facility])    Radical prostatectomy    Past Surgical History:  Procedure Laterality Date  . CORONARY ARTERY BYPASS GRAFT  December 2011   LIMA to LAD, L Radial to OM2, SVG to DX, SVG to PD  . KNEE ARTHROSCOPY Left 12/18/2013   Procedure: LEFT ARTHROSCOPY KNEE WITH DEBRIDMENT medial and lateral menisectomy  condroplasty;  Surgeon: Gearlean Alf, MD;  Location: [facility];  Service: Orthopedics;  Laterality: Left;  . LITHOTRIPSY     X 2  . PARTIAL KNEE ARTHROPLASTY Left 06/01/2015   Procedure: LEFT KNEE MEDIAL UNICOMPARTMENTAL ARTHROPLASTY;  Surgeon: Gaynelle Arabian, MD;  Location: [facility];  Service: Orthopedics;  Laterality: Left;  . PROSTATECTOMY  2008  . TONSILLECTOMY      Current Outpatient Prescriptions  Medication Sig Dispense Refill  . aspirin 81 MG tablet Take 81 mg by mouth daily.    Marland Kitchen atorvastatin (LIPITOR) 40 MG tablet TAKE 1 TABLET BY MOUTH EVERY MORNING 30 tablet 3  . lisinopril (PRINIVIL,ZESTRIL) 5 MG tablet TAKE 1 TABLET (5 MG TOTAL) BY MOUTH EVERY MORNING. 90 tablet 3  . metoprolol tartrate (LOPRESSOR) 25 MG tablet Take 1 tablet (25 mg total) by mouth 2 (two) times daily. 180 tablet 3  . naproxen sodium (ANAPROX) 220 MG tablet Take 220 mg by mouth daily as needed.    . Omega-3 Fatty Acids (FISH OIL PO) Take 1,400 mg by mouth.     No current facility-administered medications for this visit.    Allergies:  Patient has no known allergies.   Social History: The patient  reports that he quit smoking about 28 years ago. His smoking use included Cigarettes. He started smoking about 38 years ago. He has a 8.00 pack-year smoking history. He has never used smokeless tobacco. He  reports that he does not drink alcohol or use drugs.   ROS:  Please see the history of present illness. Otherwise, complete review of systems is positive for none.  All other systems are reviewed and negative.   Physical Exam: VS:  BP (!) 148/80   Pulse (!) 50   Ht _0  (1.727 m)   Wt 227 lb (103 kg)   BMI 34.52 kg/m , BMI Body mass index is 34.52 kg/m.  Wt Readings from Last 3 Encounters:  01/06/17 227 lb (103 kg)  03/15/16 200 lb (90.7 kg)  06/01/15 224 lb (101.6 kg)    General: Patient appears comfortable at rest. HEENT: Conjunctiva and lids normal, oropharynx clear. Neck: Supple, no elevated JVP or  carotid bruits, no thyromegaly. Lungs: Clear to auscultation, nonlabored breathing at rest. Cardiac: Regular rate and rhythm, no S3, soft systolic murmur, no pericardial rub. Abdomen: Soft, nontender, bowel sounds present, no guarding or rebound. Extremities: No pitting edema, distal pulses 2+. Skin: Warm and dry. Musculoskeletal: No kyphosis. Neuropsychiatric: Alert and oriented x3, affect grossly appropriate.  ECG: I personally reviewed the tracing from 03/15/2016 which showed sinus bradycardia with nonspecific ST-T changes.  Recent Labwork:  06/02/2015: BUN 17; Creatinine, Ser 0.99; Hemoglobin 13.5; Platelets 192; Potassium 5.0; Sodium 136  March 2016: Triglycerides 87, cholesterol 105, HDL 60, LDL 38, AST 22, ALT 19  Other Studies Reviewed Today:  Echocardiogram 09/25/2014: Study Conclusions  - Left ventricle: The cavity size was normal. Wall thickness was normal. Systolic function was normal. The estimated ejection fraction was in the range of 60% to 65%. Wall motion was normal; there were no regional wall motion abnormalities. Doppler parameters are consistent with abnormal left ventricular relaxation (grade 1 diastolic dysfunction). - Aortic valve: Mildly calcified annulus. Probably trileaflet. There was no significant regurgitation. - Mitral valve: Calcified annulus. Mildly thickened leaflets . There was trivial regurgitation. - Left atrium: The atrium was mildly dilated. - Right atrium: The atrium was mildly dilated. - Tricuspid valve: There was trivial regurgitation. Peak RV-RA gradient (S): 30 mm Hg. - Inferior vena cava: Not visualized. Unable to estimate CVP. - Pericardium, extracardiac: There was no pericardial effusion.  Impressions:  - Normal LV wall thickness and chamber size with LVEF 32-20%, grade 1 diastolic dysfunction. MAC with mildly thickened mitral leaflets and trivial mitral regurgitation. Mild left atrial enlargement. Mildly  sclerotic aortic valve without obvious stenosis. Trivial tricuspid regurgitation with RV-RA gradient 31 m mercury.  Assessment and Plan:  1. Multivessel CAD status post CABG in 2011. He is reporting some left arm symptoms that are atypical for angina. He has not however undergone any follow-up ischemic testing. Reports compliance with medications. We will arrange a follow-up Lexiscan Myoview to reassess ischemic burden  2. Essential hypertension, Continue lisinopril and Lopressor.  3. Hyperlipidemia, on Lipitor.  4. Arthritic knee pain, no progression since prior partial left knee arthroplasty. Does mention prior left neck injury, wonder whether he might have some neuropathic pain involving his left arm.  5. Sinus bradycardia, on beta blocker with ischemic heart disease. Not obviously symptomatic.  Current medicines were reviewed with the patient today.   Orders Placed This Encounter  Procedures  . NM Myocar Multi W/Spect W/Wall Motion / EF  . EKG 12-Lead    Disposition: Follow-up stress test results, if low risk and no progressive symptoms will probably keep annual follow-up.  Signed, Satira Sark, MD, [facility] 01/06/2017 4:11 PM    [place] at [facility]  [address], [place], [place] [address] Phone: [phone]; Fax: [phone]

## 2017-01-06 NOTE — Patient Instructions (Signed)
Medication Instructions:   Your physician recommends that you continue on your current medications as directed. Please refer to the Current Medication list given to you today.  Labwork:  NONE  Testing/Procedures: Your physician has requested that you have a lexiscan myoview. For further information please visit [email or web address]. Please follow instruction sheet, as given.  Follow-Up:  Your physician recommends that you schedule a follow-up appointment in: 1 year. You will receive a reminder letter in the mail in about 10 months reminding you to call and schedule your appointment. If you don't receive this letter, please contact our office.  Any Other Special Instructions Will Be Listed Below (If Applicable).  If you need a refill on your cardiac medications before your next appointment, please call your pharmacy. 

## 2017-01-17 ENCOUNTER — Telehealth: Payer: Self-pay

## 2017-01-17 ENCOUNTER — Encounter (HOSPITAL_COMMUNITY): Payer: Self-pay

## 2017-01-17 ENCOUNTER — Inpatient Hospital Stay (HOSPITAL_COMMUNITY): Admission: RE | Admit: 2017-01-17 | Payer: Medicare Other | Source: Ambulatory Visit

## 2017-01-17 ENCOUNTER — Encounter (HOSPITAL_COMMUNITY)
Admission: RE | Admit: 2017-01-17 | Discharge: 2017-01-17 | Disposition: A | Discharge status: Home / Self Care | Payer: Medicare Other | Source: Ambulatory Visit | Attending: Cardiology | Admitting: Cardiology

## 2017-01-17 DIAGNOSIS — I25119 Atherosclerotic heart disease of native coronary artery with unspecified angina pectoris: Secondary | ICD-10-CM | POA: Diagnosis not present

## 2017-01-17 LAB — NM MYOCAR MULTI W/SPECT W/WALL MOTION / EF
CHL CUP NUCLEAR SRS: 1
CHL CUP RESTING HR STRESS: 47 {beats}/min
LHR: 0.34
LV dias vol: 103 mL (ref 62–150)
LV sys vol: 34 mL
Peak HR: 78 {beats}/min
SDS: 3
SSS: 4
TID: 1

## 2017-01-17 MED ORDER — ISOSORBIDE MONONITRATE ER 30 MG PO TB24: 30.0000 mg | ORAL_TABLET | Freq: Every day | ORAL | 3 refills | Status: DC

## 2017-01-17 MED ORDER — TECHNETIUM TC 99M TETROFOSMIN IV KIT
30.0000 | PACK | Freq: Once | INTRAVENOUS | Status: AC | PRN
  Administered 2017-01-17: 31 via INTRAVENOUS

## 2017-01-17 MED ORDER — REGADENOSON 0.4 MG/5ML IV SOLN
INTRAVENOUS | Status: AC
  Administered 2017-01-17: 0.4 mg via INTRAVENOUS
  Filled 2017-01-17: qty 5

## 2017-01-17 MED ORDER — TECHNETIUM TC 99M TETROFOSMIN IV KIT
10.0000 | PACK | Freq: Once | INTRAVENOUS | Status: AC | PRN
  Administered 2017-01-17: 10.7 via INTRAVENOUS

## 2017-01-17 MED ORDER — SODIUM CHLORIDE 0.9% FLUSH
INTRAVENOUS | Status: AC
  Filled 2017-01-17: qty 10

## 2017-01-17 MED ORDER — SODIUM CHLORIDE 0.9% FLUSH
INTRAVENOUS | Status: AC
  Administered 2017-01-17: 10 mL via INTRAVENOUS
  Filled 2017-01-17: qty 10

## 2017-01-17 NOTE — Telephone Encounter (Signed)
-----   Message from Satira Sark, MD sent at 01/17/2017  1:05 PM EST ----- Results reviewed. Stress test shows a small region of ischemia in the mid to apical inferolateral wall. This suggests possible graft disease since CABG, possibly SVG to PD distribution. Since this is not a large ischemic territory, we can continue with medical therapy as long as his symptoms do not progress. May want to consider trying a long-acting nitrate such as Imdur 30 mg daily to see if this helps. A copy of this test should be forwarded to [facility] B, MD.

## 2017-01-17 NOTE — Telephone Encounter (Signed)
Results of test given to pt,e-scribed Imdur to local pharmacy, mailed copy of test to pt ,pcp copied

## 2017-02-12 ENCOUNTER — Other Ambulatory Visit: Payer: Self-pay | Admitting: Cardiology

## 2017-04-07 ENCOUNTER — Ambulatory Visit (INDEPENDENT_AMBULATORY_CARE_PROVIDER_SITE_OTHER): Payer: Medicare Other | Admitting: Urology

## 2017-04-07 DIAGNOSIS — Z8546 Personal history of malignant neoplasm of prostate: Secondary | ICD-10-CM | POA: Diagnosis not present

## 2017-04-07 DIAGNOSIS — Z87442 Personal history of urinary calculi: Secondary | ICD-10-CM | POA: Diagnosis not present

## 2017-04-07 DIAGNOSIS — N5201 Erectile dysfunction due to arterial insufficiency: Secondary | ICD-10-CM | POA: Diagnosis not present

## 2017-04-13 ENCOUNTER — Other Ambulatory Visit: Payer: Self-pay | Admitting: Cardiology

## 2017-05-13 ENCOUNTER — Other Ambulatory Visit: Payer: Self-pay | Admitting: Cardiology

## 2017-05-16 ENCOUNTER — Other Ambulatory Visit: Payer: Self-pay | Admitting: Cardiology

## 2017-06-17 IMAGING — NM NM MYOCAR MULTI W/SPECT W/WALL MOTION & EF
2 series · 12 of 12 positions shown · non-contrast
Comparison: none

[Series 1: rest · 8.28mm/px · 6 of 64 frames shown]
[frame 6/64]
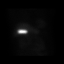
[frame 16/64]
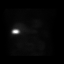
[frame 27/64]
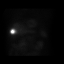
[frame 38/64]
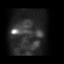
[frame 48/64]
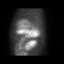
[frame 59/64]
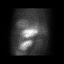

[Series 2: stress gated · 8.28mm/px · 6 of 64 frames shown]
[frame 6/64]
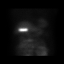
[frame 16/64]
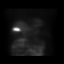
[frame 27/64]
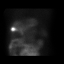
[frame 38/64]
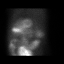
[frame 48/64]
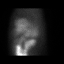
[frame 59/64]
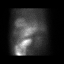

[12 of 12 positions shown; findings below may reference images not displayed]

Canned report from images found in remote index.

Refer to host system for actual result text.

## 2017-10-17 ENCOUNTER — Other Ambulatory Visit: Payer: Self-pay | Admitting: Cardiology

## 2017-11-26 ENCOUNTER — Other Ambulatory Visit: Payer: Self-pay | Admitting: Cardiology

## 2018-01-12 NOTE — Progress Notes (Signed)
Cardiology Office Note  Date: 01/15/2018   ID: Ralph [name], DOB 11-05-1951, MRN [id number]  PCP: Deloria Lair., MD  Primary Cardiologist: Rozann Lesches, MD   Chief Complaint  Patient presents with  . Coronary Artery Disease    History of Present Illness: Ralph [name] is a 67 y.o. male last seen in February 2018.  He presents for a routine follow-up visit, reports no angina symptoms or nitroglycerin use.  He has not been as active over the winter, enjoys playing golf when the weather allows.  Interval lab work from May 2018 is outlined below.  I went over his current medications which are outlined below.  He reports no intolerances.  Myoview from February of last year was overall low risk.  We have discussed continuing observation.  I personally reviewed his ECG today which shows sinus bradycardia with IVCD.  Past Medical History:  Diagnosis Date  . Arthritis   . CAD (coronary artery disease)    Multivessel status post CABG December 2011 - Dr. [name]  . Essential hypertension   . History of kidney stones   . Hyperlipidemia   . Neck pain   . NSTEMI (non-ST elevated myocardial infarction) [facility]) December 2011  . Prostate cancer [facility])    Radical prostatectomy    Past Surgical History:  Procedure Laterality Date  . CORONARY ARTERY BYPASS GRAFT  December 2011   LIMA to LAD, L Radial to OM2, SVG to DX, SVG to PD  . KNEE ARTHROSCOPY Left 12/18/2013   Procedure: LEFT ARTHROSCOPY KNEE WITH DEBRIDMENT medial and lateral menisectomy condroplasty;  Surgeon: Gearlean Alf, MD;  [facility];  Service: Orthopedics;  Laterality: Left;  . LITHOTRIPSY     X 2  . PARTIAL KNEE ARTHROPLASTY Left 06/01/2015   Procedure: LEFT KNEE MEDIAL UNICOMPARTMENTAL ARTHROPLASTY;  Surgeon: Gaynelle Arabian, MD;  Location: [facility];  Service: Orthopedics;  Laterality: Left;  . PROSTATECTOMY  2008  . TONSILLECTOMY      Current Outpatient Medications  Medication Sig Dispense Refill  .  aspirin 81 MG tablet Take 81 mg by mouth daily.    Marland Kitchen atorvastatin (LIPITOR) 40 MG tablet TAKE 1 TABLET BY MOUTH EVERY MORNING 30 tablet 11  . Ibuprofen 200 MG CAPS Take 200 mg by mouth daily.    . isosorbide mononitrate (IMDUR) 30 MG 24 hr tablet TAKE ONE TABLET BY MOUTH ONCE DAILY 30 tablet 6  . lisinopril (PRINIVIL,ZESTRIL) 5 MG tablet TAKE 1 TABLET (5 MG TOTAL) BY MOUTH EVERY MORNING. 90 tablet 3  . metoprolol tartrate (LOPRESSOR) 25 MG tablet Take 1 tablet (25 mg total) by mouth 2 (two) times daily. 180 tablet 3  . naproxen sodium (ANAPROX) 220 MG tablet Take 220 mg by mouth daily as needed.    . Omega-3 Fatty Acids (FISH OIL PO) Take 1,400 mg by mouth.     No current facility-administered medications for this visit.    Allergies:  Patient has no known allergies.   Social History: The patient  reports that he quit smoking about 29 years ago. His smoking use included cigarettes. He started smoking about 39 years ago. He has a 8.00 pack-year smoking history. he has never used smokeless tobacco. He reports that he does not drink alcohol or use drugs.   ROS:  Please see the history of present illness. Otherwise, complete review of systems is positive for chronic knee pain.  All other systems are reviewed and negative.   Physical Exam: VS:  BP  118/76   Pulse 68   Ht 5\' 8"  (1.727 m)   Wt 237 lb 9.6 oz (107.8 kg)   SpO2 96%   BMI 36.13 kg/m , BMI Body mass index is 36.13 kg/m.  Wt Readings from Last 3 Encounters:  01/15/18 237 lb 9.6 oz (107.8 kg)  01/06/17 227 lb (103 kg)  03/15/16 200 lb (90.7 kg)    General: Patient appears comfortable at rest. HEENT: Conjunctiva and lids normal, oropharynx clear. Neck: Supple, no elevated JVP or carotid bruits, no thyromegaly. Lungs: Clear to auscultation, nonlabored breathing at rest. Cardiac: Regular rate and rhythm, no S3, soft systolic murmur. Abdomen: Soft, nontender, bowel sounds present, no guarding or rebound. Extremities: No pitting  edema, distal pulses 2+. Skin: Warm and dry. Musculoskeletal: No kyphosis. Neuropsychiatric: Alert and oriented x3, affect grossly appropriate.  ECG: I personally reviewed the tracing from 01/06/2017 which showed sinus bradycardia with IVCD.  Recent Labwork:  May 2018: BUN 17, creatinine 1.04, AST 20, ALT 16, potassium 4.8, cholesterol 131, triglycerides 95, HDL 67, LDL 45  Other Studies Reviewed Today:  Lexiscan Myoview 01/17/2017:  No diagnostic ST segment changes over baseline abnormalities.  Small, mild intensity, mid to apical inferolateral defect that is reversible and consistent with ischemia.  This is a low risk study.  Nuclear stress EF: 67%.  Assessment and Plan:  1.  Multivessel CAD status post CABG in 2011 with follow-up Myoview study last year low risk and no progressive angina symptoms.  Continue medical therapy and observation.  ECG reviewed.  2.  Essential hypertension, no changes made to present regimen.  Blood pressure is normal today.  Keep follow-up with PCP.   3.  Mixed hyperlipidemia, on Lipitor.  4.  Sinus bradycardia, asymptomatic.  Current medicines were reviewed with the patient today.   Orders Placed This Encounter  Procedures  . EKG 12-Lead    Disposition: Follow-up in 1 year, sooner if needed.  Signed, Satira Sark, MD, [facility] 01/15/2018 11:48 AM    [place] at [place]. [address] [place], [place] [address] Phone: [phone]; Fax: [phone]

## 2018-01-15 ENCOUNTER — Encounter: Payer: Self-pay | Admitting: Cardiology

## 2018-01-15 ENCOUNTER — Ambulatory Visit (INDEPENDENT_AMBULATORY_CARE_PROVIDER_SITE_OTHER): Payer: Medicare Other | Admitting: Cardiology

## 2018-01-15 VITALS — BP 118/76 | HR 68 | Ht 68.0 in | Wt 237.6 lb

## 2018-01-15 DIAGNOSIS — I25119 Atherosclerotic heart disease of native coronary artery with unspecified angina pectoris: Secondary | ICD-10-CM

## 2018-01-15 DIAGNOSIS — I10 Essential (primary) hypertension: Secondary | ICD-10-CM

## 2018-01-15 DIAGNOSIS — R001 Bradycardia, unspecified: Secondary | ICD-10-CM | POA: Diagnosis not present

## 2018-01-15 DIAGNOSIS — E782 Mixed hyperlipidemia: Secondary | ICD-10-CM

## 2018-01-15 NOTE — Patient Instructions (Signed)
Your physician wants you to follow-up in: 1 year with Dr.McDowell You will receive a reminder letter in the mail two months in advance. If you don't receive a letter, please call our office to schedule the follow-up appointment.    Your physician recommends that you continue on your current medications as directed. Please refer to the Current Medication list given to you today.    If you need a refill on your cardiac medications before your next appointment, please call your pharmacy.     No lab work or tests ordered today.      Thank you for choosing [facility] !        

## 2018-03-15 ENCOUNTER — Other Ambulatory Visit: Payer: Self-pay

## 2018-03-15 MED ORDER — ATORVASTATIN CALCIUM 40 MG PO TABS: 40.0000 mg | ORAL_TABLET | Freq: Every morning | ORAL | 3 refills | Status: DC

## 2018-03-15 NOTE — Telephone Encounter (Signed)
90 day refill fro lipitor done

## 2018-04-11 ENCOUNTER — Other Ambulatory Visit: Payer: Self-pay | Admitting: Cardiology

## 2018-05-25 ENCOUNTER — Other Ambulatory Visit: Payer: Self-pay | Admitting: Cardiology

## 2018-11-07 ENCOUNTER — Encounter (INDEPENDENT_AMBULATORY_CARE_PROVIDER_SITE_OTHER): Payer: Self-pay | Admitting: *Deleted

## 2018-12-10 ENCOUNTER — Encounter (INDEPENDENT_AMBULATORY_CARE_PROVIDER_SITE_OTHER): Payer: Self-pay | Admitting: *Deleted

## 2018-12-13 ENCOUNTER — Other Ambulatory Visit (INDEPENDENT_AMBULATORY_CARE_PROVIDER_SITE_OTHER): Payer: Self-pay | Admitting: *Deleted

## 2018-12-13 DIAGNOSIS — Z1211 Encounter for screening for malignant neoplasm of colon: Secondary | ICD-10-CM

## 2019-01-21 NOTE — Progress Notes (Signed)
Cardiology Office Note  Date: 01/22/2019   ID: Ralph [name], DOB 08-07-1951, MRN [id number]  PCP: Loman Brooklyn, FNP  Primary Cardiologist: Rozann Lesches, MD   Chief Complaint  Patient presents with  . Coronary Artery Disease    History of Present Illness: Ralph [name] is a 68 y.o. male last seen in February 2019.  He is here for a routine follow-up visit.  He reports no angina symptoms.  He enjoys playing golf when the weather allows.  He and his wife also joined [employer].  I reviewed his medications which are stable and outlined below.  He reports having lab work with PCP about a month ago, we will request the results.  Statin dose has been stable.  He reports no intolerances.  I personally reviewed his ECG today which shows sinus bradycardia with LVH and probable old inferior infarct pattern, nonspecific ST changes.  Last ischemic testing was in February 2018 as outlined below.  We will continue with observation in the absence of progressive angina.  Past Medical History:  Diagnosis Date  . Arthritis   . CAD (coronary artery disease)    Multivessel status post CABG December 2011 - Dr. [name]  . Essential hypertension   . History of kidney stones   . Hyperlipidemia   . Neck pain   . NSTEMI (non-ST elevated myocardial infarction) [facility]) December 2011  . Prostate cancer [facility])    Radical prostatectomy    Past Surgical History:  Procedure Laterality Date  . CORONARY ARTERY BYPASS GRAFT  December 2011   LIMA to LAD, L Radial to OM2, SVG to DX, SVG to PD  . KNEE ARTHROSCOPY Left 12/18/2013   Procedure: LEFT ARTHROSCOPY KNEE WITH DEBRIDMENT medial and lateral menisectomy condroplasty;  Surgeon: Gearlean Alf, MD;  [facility];  Service: Orthopedics;  Laterality: Left;  . LITHOTRIPSY     X 2  . PARTIAL KNEE ARTHROPLASTY Left 06/01/2015   Procedure: LEFT KNEE MEDIAL UNICOMPARTMENTAL ARTHROPLASTY;  Surgeon: Gaynelle Arabian, MD;  [facility];  Service:  Orthopedics;  Laterality: Left;  . PROSTATECTOMY  2008  . TONSILLECTOMY      Current Outpatient Medications  Medication Sig Dispense Refill  . aspirin 81 MG tablet Take 81 mg by mouth daily.    Marland Kitchen atorvastatin (LIPITOR) 40 MG tablet Take 1 tablet (40 mg total) by mouth every morning. 90 tablet 3  . Ibuprofen 200 MG CAPS Take 200 mg by mouth daily.    . isosorbide mononitrate (IMDUR) 30 MG 24 hr tablet TAKE ONE TABLET BY MOUTH ONCE DAILY 30 tablet 11  . lisinopril (PRINIVIL,ZESTRIL) 5 MG tablet TAKE 1 TABLET (5 MG TOTAL) BY MOUTH EVERY MORNING. 90 tablet 3  . metoprolol tartrate (LOPRESSOR) 25 MG tablet Take 1 tablet (25 mg total) by mouth 2 (two) times daily. 180 tablet 3  . Omega-3 Fatty Acids (FISH OIL PO) Take 1,400 mg by mouth.     No current facility-administered medications for this visit.    Allergies:  Patient has no known allergies.   Social History: The patient  reports that he quit smoking about 30 years ago. His smoking use included cigarettes. He started smoking about 40 years ago. He has a 8.00 pack-year smoking history. He has never used smokeless tobacco. He reports that he does not drink alcohol or use drugs.   ROS:  Please see the history of present illness. Otherwise, complete review of systems is positive for arthritic pain and stiffness  in his hands.  All other systems are reviewed and negative.   Physical Exam: VS:  BP 132/73 (BP Location: Right Arm)   Pulse 61   Ht 5\' 8"  (1.727 m)   Wt 228 lb (103.4 kg)   SpO2 95%   BMI 34.67 kg/m , BMI Body mass index is 34.67 kg/m.  Wt Readings from Last 3 Encounters:  01/22/19 228 lb (103.4 kg)  01/15/18 237 lb 9.6 oz (107.8 kg)  01/06/17 227 lb (103 kg)    General: Patient appears comfortable at rest. HEENT: Conjunctiva and lids normal, oropharynx clear. Neck: Supple, no elevated JVP or carotid bruits, no thyromegaly. Lungs: Clear to auscultation, nonlabored breathing at rest. Cardiac: Regular rate and rhythm, no  S3, soft systolic murmur. Abdomen: Soft, nontender, bowel sounds present. Extremities: No pitting edema, distal pulses 2+. Skin: Warm and dry. Musculoskeletal: No kyphosis. Neuropsychiatric: Alert and oriented x3, affect grossly appropriate.  ECG: I personally reviewed the tracing from 01/15/2018 which showed sinus bradycardia with IVCD.  Recent Labwork:  May 2018: BUN 17, creatinine 1.04, AST 20, ALT 16, potassium 4.8, cholesterol 131, triglycerides 95, HDL 67, LDL 45  Other Studies Reviewed Today:  Lexiscan Myoview 01/17/2017:  No diagnostic ST segment changes over baseline abnormalities.  Small, mild intensity, mid to apical inferolateral defect that is reversible and consistent with ischemia.  This is a low risk study.  Nuclear stress EF: 67%.  Assessment and Plan:  1.  Multivessel CAD status post CABG in 2011.  Follow-up Myoview in 2018 revealed a region of inferolateral ischemia which we are following on medical therapy in the absence of progressive angina.  Continue with current regimen, refill for nitroglycerin provided.  2.  Essential hypertension.  Blood pressure is reasonably controlled today.  He remains on lisinopril and Lopressor.  Keep follow-up with PCP.  3.  Mixed hyperlipidemia on Lipitor.  Requesting most recent lab work from PCP.   Current medicines were reviewed with the patient today.   Orders Placed This Encounter  Procedures  . EKG 12-Lead    Disposition: Follow-up in 1 year.  Signed, Satira Sark, MD, [facility] 01/22/2019 8:47 AM    [place] at [place], [place], [place] [address] Phone: [phone]; Fax: [phone]

## 2019-01-22 ENCOUNTER — Ambulatory Visit (INDEPENDENT_AMBULATORY_CARE_PROVIDER_SITE_OTHER): Payer: Medicare Other | Admitting: Cardiology

## 2019-01-22 ENCOUNTER — Encounter: Payer: Self-pay | Admitting: Cardiology

## 2019-01-22 ENCOUNTER — Encounter: Payer: Self-pay | Admitting: *Deleted

## 2019-01-22 VITALS — BP 132/73 | HR 61 | Ht 68.0 in | Wt 228.0 lb

## 2019-01-22 DIAGNOSIS — E782 Mixed hyperlipidemia: Secondary | ICD-10-CM | POA: Diagnosis not present

## 2019-01-22 DIAGNOSIS — I10 Essential (primary) hypertension: Secondary | ICD-10-CM | POA: Diagnosis not present

## 2019-01-22 DIAGNOSIS — I25119 Atherosclerotic heart disease of native coronary artery with unspecified angina pectoris: Secondary | ICD-10-CM

## 2019-01-22 MED ORDER — NITROGLYCERIN 0.4 MG SL SUBL: 0.4000 mg | SUBLINGUAL_TABLET | SUBLINGUAL | 0 refills | Status: DC | PRN

## 2019-01-22 NOTE — Patient Instructions (Addendum)
Medication Instructions:   Your physician recommends that you continue on your current medications as directed. Please refer to the Current Medication list given to you today.  A prescription for nitroglycerin 0.4 mg sublingual tables has been sent to your pharmacy. Dissolve one under tongue for chest pain every 5 minutes up to 3 doses. If no relief, proceed to ED.  Labwork:  NONE  Testing/Procedures:  NONE  Follow-Up:  Your physician recommends that you schedule a follow-up appointment in: 1 year. You will receive a reminder letter in the mail in about 10 months reminding you to call and schedule your appointment. If you don't receive this letter, please contact our office.  Any Other Special Instructions Will Be Listed Below (If Applicable).  If you need a refill on your cardiac medications before your next appointment, please call your pharmacy.

## 2019-02-18 ENCOUNTER — Telehealth (INDEPENDENT_AMBULATORY_CARE_PROVIDER_SITE_OTHER): Payer: Self-pay | Admitting: *Deleted

## 2019-02-18 ENCOUNTER — Encounter (INDEPENDENT_AMBULATORY_CARE_PROVIDER_SITE_OTHER): Payer: Self-pay | Admitting: *Deleted

## 2019-02-18 MED ORDER — PEG 3350-KCL-NA BICARB-NACL 420 G PO SOLR: 4000.0000 mL | Freq: Once | ORAL | 0 refills | Status: AC

## 2019-02-18 NOTE — Telephone Encounter (Signed)
Patient needs trilyte 

## 2019-03-07 ENCOUNTER — Other Ambulatory Visit: Payer: Self-pay | Admitting: Cardiology

## 2019-06-07 ENCOUNTER — Other Ambulatory Visit: Payer: Self-pay | Admitting: *Deleted

## 2019-06-07 MED ORDER — ATORVASTATIN CALCIUM 40 MG PO TABS: 40.0000 mg | ORAL_TABLET | Freq: Every morning | ORAL | 3 refills | Status: DC

## 2019-06-27 ENCOUNTER — Other Ambulatory Visit: Payer: Self-pay | Admitting: Cardiology

## 2019-07-01 ENCOUNTER — Ambulatory Visit (INDEPENDENT_AMBULATORY_CARE_PROVIDER_SITE_OTHER): Payer: Self-pay

## 2019-07-01 ENCOUNTER — Telehealth (INDEPENDENT_AMBULATORY_CARE_PROVIDER_SITE_OTHER): Payer: Self-pay | Admitting: *Deleted

## 2019-07-01 ENCOUNTER — Encounter (INDEPENDENT_AMBULATORY_CARE_PROVIDER_SITE_OTHER): Payer: Self-pay | Admitting: *Deleted

## 2019-07-01 ENCOUNTER — Other Ambulatory Visit: Payer: Self-pay

## 2019-07-01 NOTE — Telephone Encounter (Signed)
Referring MD/PCP: barrino   Procedure: tcs  Reason/Indication:  screening  Has patient had this procedure before?  no  If so, when, by whom and where?    Is there a family history of colon cancer?  no  Who?  What age when diagnosed?    Is patient diabetic?   no      Does patient have prosthetic heart valve or mechanical valve?  no  Do you have a pacemaker/defibrillator?  no  Has patient ever had endocarditis/atrial fibrillation? no  Does patient use oxygen?  Has patient had joint replacement within last 12 months?  no  Is patient constipated or do they take laxatives? no  Does patient have a history of alcohol/drug use?  no  Is patient on blood thinner such as Coumadin, Plavix and/or Aspirin? yes  Medications: asa 81 mg daily, aleve prn, omega 3 daily, metoprolol 25 mg daily, lisinopril 5 mg daily, isosorbide 30 mg daily, atorvastatin 40 mg daily  Allergies: nkda  Medication Adjustment per Dr Lindi Adie, NP: asa 2 days  Procedure date & time: 07/31/19 at 730

## 2019-07-02 NOTE — Telephone Encounter (Signed)
Ok to schedule colonoscopy.

## 2019-07-30 ENCOUNTER — Other Ambulatory Visit: Payer: Self-pay

## 2019-07-30 ENCOUNTER — Other Ambulatory Visit (HOSPITAL_COMMUNITY)
Admission: RE | Admit: 2019-07-30 | Discharge: 2019-07-30 | Disposition: A | Discharge status: Home / Self Care | Payer: Medicare Other | Source: Ambulatory Visit | Attending: Internal Medicine | Admitting: Internal Medicine

## 2019-07-30 DIAGNOSIS — Z20828 Contact with and (suspected) exposure to other viral communicable diseases: Secondary | ICD-10-CM | POA: Insufficient documentation

## 2019-07-30 DIAGNOSIS — Z01812 Encounter for preprocedural laboratory examination: Secondary | ICD-10-CM | POA: Diagnosis present

## 2019-07-31 ENCOUNTER — Ambulatory Visit (HOSPITAL_COMMUNITY)
Admission: RE | Admit: 2019-07-31 | Discharge: 2019-07-31 | Disposition: A | Discharge status: Home / Self Care | Payer: Medicare Other | Source: Ambulatory Visit | Attending: Internal Medicine | Admitting: Internal Medicine

## 2019-07-31 ENCOUNTER — Other Ambulatory Visit: Payer: Self-pay

## 2019-07-31 ENCOUNTER — Encounter (HOSPITAL_COMMUNITY)
Admission: RE | Disposition: A | Discharge status: Home / Self Care | Payer: Self-pay | Source: Ambulatory Visit | Attending: Internal Medicine

## 2019-07-31 ENCOUNTER — Encounter (HOSPITAL_COMMUNITY): Payer: Self-pay | Admitting: *Deleted

## 2019-07-31 DIAGNOSIS — Z7982 Long term (current) use of aspirin: Secondary | ICD-10-CM | POA: Diagnosis not present

## 2019-07-31 DIAGNOSIS — E785 Hyperlipidemia, unspecified: Secondary | ICD-10-CM | POA: Diagnosis not present

## 2019-07-31 DIAGNOSIS — Z87891 Personal history of nicotine dependence: Secondary | ICD-10-CM | POA: Insufficient documentation

## 2019-07-31 DIAGNOSIS — D120 Benign neoplasm of cecum: Secondary | ICD-10-CM | POA: Diagnosis not present

## 2019-07-31 DIAGNOSIS — D122 Benign neoplasm of ascending colon: Secondary | ICD-10-CM | POA: Diagnosis not present

## 2019-07-31 DIAGNOSIS — Z1211 Encounter for screening for malignant neoplasm of colon: Secondary | ICD-10-CM | POA: Diagnosis not present

## 2019-07-31 DIAGNOSIS — Z791 Long term (current) use of non-steroidal anti-inflammatories (NSAID): Secondary | ICD-10-CM | POA: Insufficient documentation

## 2019-07-31 DIAGNOSIS — Z951 Presence of aortocoronary bypass graft: Secondary | ICD-10-CM | POA: Diagnosis not present

## 2019-07-31 DIAGNOSIS — I2510 Atherosclerotic heart disease of native coronary artery without angina pectoris: Secondary | ICD-10-CM | POA: Diagnosis not present

## 2019-07-31 DIAGNOSIS — I10 Essential (primary) hypertension: Secondary | ICD-10-CM | POA: Insufficient documentation

## 2019-07-31 DIAGNOSIS — M1990 Unspecified osteoarthritis, unspecified site: Secondary | ICD-10-CM | POA: Insufficient documentation

## 2019-07-31 DIAGNOSIS — Z79899 Other long term (current) drug therapy: Secondary | ICD-10-CM | POA: Insufficient documentation

## 2019-07-31 DIAGNOSIS — I252 Old myocardial infarction: Secondary | ICD-10-CM | POA: Diagnosis not present

## 2019-07-31 DIAGNOSIS — Z8546 Personal history of malignant neoplasm of prostate: Secondary | ICD-10-CM | POA: Insufficient documentation

## 2019-07-31 DIAGNOSIS — D123 Benign neoplasm of transverse colon: Secondary | ICD-10-CM

## 2019-07-31 DIAGNOSIS — K5730 Diverticulosis of large intestine without perforation or abscess without bleeding: Secondary | ICD-10-CM | POA: Insufficient documentation

## 2019-07-31 HISTORY — PX: POLYPECTOMY: SHX5525

## 2019-07-31 HISTORY — PX: COLONOSCOPY: SHX5424

## 2019-07-31 LAB — SARS CORONAVIRUS 2 (TAT 6-24 HRS): SARS Coronavirus 2: NEGATIVE

## 2019-07-31 SURGERY — COLONOSCOPY
Anesthesia: Moderate Sedation

## 2019-07-31 MED ORDER — MIDAZOLAM HCL 5 MG/5ML IJ SOLN
INTRAMUSCULAR | Status: AC
  Filled 2019-07-31: qty 10

## 2019-07-31 MED ORDER — STERILE WATER FOR IRRIGATION IR SOLN
Status: DC | PRN
  Administered 2019-07-31: 08:00:00 1.5 mL

## 2019-07-31 MED ORDER — MEPERIDINE HCL 50 MG/ML IJ SOLN
INTRAMUSCULAR | Status: AC
  Filled 2019-07-31: qty 1

## 2019-07-31 MED ORDER — SODIUM CHLORIDE 0.9 % IV SOLN: INTRAVENOUS | Status: DC

## 2019-07-31 MED ORDER — MEPERIDINE HCL 50 MG/ML IJ SOLN
INTRAMUSCULAR | Status: DC | PRN
  Administered 2019-07-31 (×2): 25 mg

## 2019-07-31 MED ORDER — MIDAZOLAM HCL 5 MG/5ML IJ SOLN
INTRAMUSCULAR | Status: DC | PRN
  Administered 2019-07-31 (×2): 2 mg via INTRAVENOUS

## 2019-07-31 NOTE — H&P (Signed)
Ralph [name] is an 68 y.o. male.   Chief Complaint: Patient is here for colonoscopy. HPI: Patient is 68 year old Caucasian male who is here for screening colonoscopy.  His last exam was evaluated 10 years ago.  He denies abdominal pain change in bowel habits or rectal bleeding.  Family history is negative for CRC. Last Naprosyn dose was 2 days ago and aspirin dose was 3 days ago.  Past Medical History:  Diagnosis Date  . Arthritis   . CAD (coronary artery disease)    Multivessel status post CABG December 2011 - Dr. [name]  . Essential hypertension   . History of kidney stones   . Hyperlipidemia   . Neck pain   . NSTEMI (non-ST elevated myocardial infarction) [facility]) December 2011  . Prostate cancer [facility])    Radical prostatectomy    Past Surgical History:  Procedure Laterality Date  . CORONARY ARTERY BYPASS GRAFT  December 2011   LIMA to LAD, L Radial to OM2, SVG to DX, SVG to PD  . KNEE ARTHROSCOPY Left 12/18/2013   Procedure: LEFT ARTHROSCOPY KNEE WITH DEBRIDMENT medial and lateral menisectomy condroplasty;  Surgeon: Gearlean Alf, MD;  Location: [facility];  Service: Orthopedics;  Laterality: Left;  . LITHOTRIPSY     X 2  . PARTIAL KNEE ARTHROPLASTY Left 06/01/2015   Procedure: LEFT KNEE MEDIAL UNICOMPARTMENTAL ARTHROPLASTY;  Surgeon: Gaynelle Arabian, MD;  Location: [facility];  Service: Orthopedics;  Laterality: Left;  . PROSTATECTOMY  2008  . TONSILLECTOMY      Family History  Problem Relation Age of Onset  . Alzheimer's disease Mother   . Stroke Father    Social History:  reports that he quit smoking about 30 years ago. His smoking use included cigarettes. He started smoking about 40 years ago. He has a 8.00 pack-year smoking history. He has never used smokeless tobacco. He reports that he does not drink alcohol or use drugs.  Allergies: No Known Allergies  Medications Prior to Admission  Medication Sig Dispense Refill  . aspirin 81 MG tablet Take 81 mg by mouth daily.    Marland Kitchen  atorvastatin (LIPITOR) 40 MG tablet Take 1 tablet (40 mg total) by mouth every morning. 90 tablet 3  . isosorbide mononitrate (IMDUR) 30 MG 24 hr tablet TAKE ONE TABLET BY MOUTH ONCE DAILY (Patient taking differently: Take 30 mg by mouth daily. ) 90 tablet 3  . lisinopril (ZESTRIL) 5 MG tablet TAKE 1 TABLET (5 MG TOTAL) BY MOUTH EVERY MORNING. (Patient taking differently: Take 5 mg by mouth daily. ) 90 tablet 3  . metoprolol tartrate (LOPRESSOR) 25 MG tablet Take 1 tablet (25 mg total) by mouth 2 (two) times daily. 180 tablet 3  . naproxen sodium (ALEVE) 220 MG tablet Take 440 mg by mouth daily.    . Omega-3 Fatty Acids (FISH OIL PO) Take 1,040 mg by mouth daily.     . nitroGLYCERIN (NITROSTAT) 0.4 MG SL tablet Place 1 tablet (0.4 mg total) under the tongue every 5 (five) minutes x 3 doses as needed for chest pain (up to 3 doses, If no relief after 3rd dose, proceed to the ED for an evaluation or call 911). (Patient not taking: Reported on 07/24/2019) 75 tablet 0    Results for orders placed or performed during the hospital encounter of 07/30/19 (from the past 48 hour(s))  SARS CORONAVIRUS 2 (TAT 6-24 HRS) Nasopharyngeal Nasopharyngeal Swab     Status: None   Collection Time: 07/30/19  7:20 AM  Specimen: Nasopharyngeal Swab  Result Value Ref Range   SARS Coronavirus 2 NEGATIVE NEGATIVE    Comment: (NOTE) SARS-CoV-2 target nucleic acids are NOT DETECTED. The SARS-CoV-2 RNA is generally detectable in upper and lower respiratory specimens during the acute phase of infection. Negative results do not preclude SARS-CoV-2 infection, do not rule out co-infections with other pathogens, and should not be used as the sole basis for treatment or other patient management decisions. Negative results must be combined with clinical observations, patient history, and epidemiological information. The expected result is Negative. Fact Sheet for Patients: SugarRoll.be Fact Sheet  for Healthcare Providers: https://www.woods-mathews.com/ This test is not yet approved or cleared by the [place] FDA and  has been authorized for detection and/or diagnosis of SARS-CoV-2 by FDA under an Emergency Use Authorization (EUA). This EUA will remain  in effect (meaning this test can be used) for the duration of the COVID-19 declaration under Section 56 4(b)(1) of the Act, 21 U.S.C. section 360bbb-3(b)(1), unless the authorization is terminated or revoked sooner. Performed at [facility], [place] [address] [place], [place] [address]    No results found.  ROS  Blood pressure (!) 166/73, pulse 71, temperature 98.5 F (36.9 C), temperature source Oral, resp. rate 18, height 5\' 8"  (1.727 m), weight 97.5 kg, SpO2 100 %. Physical Exam  Constitutional: He appears well-developed and well-nourished.  HENT:  Mouth/Throat: Oropharynx is clear and moist.  Eyes: Conjunctivae are normal. No scleral icterus.  Neck: No thyromegaly present.  Cardiovascular: Normal rate and normal heart sounds.  No murmur heard. Respiratory: Effort normal and breath sounds normal.  Midsternal scar.  GI:  Abdomen is full but soft and nontender with organomegaly or masses.  Musculoskeletal:        General: No edema.  Lymphadenopathy:    He has no cervical adenopathy.  Neurological: He is alert.  Skin: Skin is warm and dry.     Assessment/Plan Average risk screening colonoscopy.  Hildred Laser, MD 07/31/2019, 7:30 AM

## 2019-07-31 NOTE — Op Note (Signed)
[facility] Patient Name: Ralph [name] Procedure Date: 07/31/2019 7:00 AM MRN: [id number] Date of Birth: 15-Jan-1951 Attending MD: Hildred Laser , MD CSN: [id number] Age: 68 Admit Type: Outpatient Procedure:                Colonoscopy Indications:              Screening for colorectal malignant neoplasm Providers:                Hildred Laser, MD, Gerome Sam, RN, Raphael Gibney, Technician Referring MD:             Chapman Fitch, MD Medicines:                Meperidine 50 mg IV, Midazolam 5 mg IV Complications:            No immediate complications. Estimated Blood Loss:     Estimated blood loss was minimal. Procedure:                Pre-Anesthesia Assessment:                           - Prior to the procedure, a History and Physical                            was performed, and patient medications and                            allergies were reviewed. The patient's tolerance of                            previous anesthesia was also reviewed. The risks                            and benefits of the procedure and the sedation                            options and risks were discussed with the patient.                            All questions were answered, and informed consent                            was obtained. Prior Anticoagulants: The patient has                            taken no previous anticoagulant or antiplatelet                            agents except for aspirin and has taken no previous                            anticoagulant or antiplatelet agents except for  NSAID medication. ASA Grade Assessment: II - A                            patient with mild systemic disease. After reviewing                            the risks and benefits, the patient was deemed in                            satisfactory condition to undergo the procedure.                           After obtaining informed consent, the  colonoscope                            was passed under direct vision. Throughout the                            procedure, the patient's blood pressure, pulse, and                            oxygen saturations were monitored continuously. The                            [id number]) was introduced through the                            anus and advanced to the the cecum, identified by                            appendiceal orifice and ileocecal valve. The                            colonoscopy was performed without difficulty. The                            patient tolerated the procedure well. The quality                            of the bowel preparation was adequate. The                            ileocecal valve, appendiceal orifice, and rectum                            were photographed. Scope In: 7:40:31 AM Scope Out: 8:03:52 AM Scope Withdrawal Time: 0 hours 18 minutes 26 seconds  Total Procedure Duration: 0 hours 23 minutes 21 seconds  Findings:      The perianal and digital rectal examinations were normal.      Eight polyps were found in the splenic flexure, transverse colon,       ascending colon and cecum. The polyps were diminutive in size. These       were biopsied with a cold forceps for histology. The pathology  specimen       was placed into Bottle Number 1.      A single small-mouthed diverticulum was found in the sigmoid colon.      The retroflexed view of the distal rectum and anal verge was normal and       showed no anal or rectal abnormalities. Impression:               - Eight diminutive polyps at the splenic flexure,                            in the transverse colon, in the ascending colon and                            in the cecum. Biopsied.                           - Diverticulosis in the sigmoid colon. Moderate Sedation:      Moderate (conscious) sedation was administered by the endoscopy nurse       and supervised by the endoscopist. The  following parameters were       monitored: oxygen saturation, heart rate, blood pressure, CO2       capnography and response to care. Total physician intraservice time was       29 minutes. Recommendation:           - Patient has a contact number available for                            emergencies. The signs and symptoms of potential                            delayed complications were discussed with the                            patient. Return to normal activities tomorrow.                            Written discharge instructions were provided to the                            patient.                           - Resume previous diet today.                           - Continue present medications.                           - No aspirin, ibuprofen, naproxen, or other                            non-steroidal anti-inflammatory drugs for 1 day.                           - Await pathology results.                           -  Repeat colonoscopy is recommended. The                            colonoscopy date will be determined after pathology                            results from today's exam become available for                            review. Procedure Code(s):        --- Professional ---                           [phone], Colonoscopy, flexible; with biopsy, single                            or multiple                           99153, Moderate sedation; each additional 15                            minutes intraservice time                           G0500, Moderate sedation services provided by the                            same physician or other qualified health care                            professional performing a gastrointestinal                            endoscopic service that sedation supports,                            requiring the presence of an independent trained                            observer to assist in the monitoring of the                             patient's level of consciousness and physiological                            status; initial 15 minutes of intra-service time;                            patient age 68 years or older (additional time may                            be reported with [phone], as appropriate) Diagnosis Code(s):        --- Professional ---  Z12.11, Encounter for screening for malignant                            neoplasm of colon                           K63.5, Polyp of colon                           K57.30, Diverticulosis of large intestine without                            perforation or abscess without bleeding CPT copyright 2019 American Medical Association. All rights reserved. The codes documented in this report are preliminary and upon coder review may  be revised to meet current compliance requirements. Hildred Laser, MD Hildred Laser, MD 07/31/2019 8:15:02 AM This report has been signed electronically. Number of Addenda: 0

## 2019-07-31 NOTE — Discharge Instructions (Signed)
No aspirin or NSAIDs for 24 hours. Resume other medications and diet as before. No driving for 24 hours. Physician will call with biopsy results.   Colonoscopy, Adult, Care After This sheet gives you information about how to care for yourself after your procedure. Your doctor may also give you more specific instructions. If you have problems or questions, call your doctor. What can I expect after the procedure? After the procedure, it is common to have:  A small amount of blood in your poop for 24 hours.  Some gas.  Mild cramping or bloating in your belly. Follow these instructions at home: General instructions  For the first 24 hours after the procedure: ? Do not drive or use machinery. ? Do not sign important documents. ? Do not drink alcohol. ? Do your daily activities more slowly than normal. ? Eat foods that are soft and easy to digest.  Take over-the-counter or prescription medicines only as told by your doctor. To help cramping and bloating:   Try walking around.  Put heat on your belly (abdomen) as told by your doctor. Use a heat source that your doctor recommends, such as a moist heat pack or a heating pad. ? Put a towel between your skin and the heat source. ? Leave the heat on for 20-30 minutes. ? Remove the heat if your skin turns bright red. This is especially important if you cannot feel pain, heat, or cold. You can get burned. Eating and drinking   Drink enough fluid to keep your pee (urine) clear or pale yellow.  Return to your normal diet as told by your doctor. Avoid heavy or fried foods that are hard to digest.  Avoid drinking alcohol for as long as told by your doctor. Contact a doctor if:  You have blood in your poop (stool) 2-3 days after the procedure. Get help right away if:  You have more than a small amount of blood in your poop.  You see large clumps of tissue (blood clots) in your poop.  Your belly is swollen.  You feel sick to your  stomach (nauseous).  You throw up (vomit).  You have a fever.  You have belly pain that gets worse, and medicine does not help your pain. Summary  After the procedure, it is common to have a small amount of blood in your poop. You may also have mild cramping and bloating in your belly.  For the first 24 hours after the procedure, do not drive or use machinery, do not sign important documents, and do not drink alcohol.  Get help right away if you have a lot of blood in your poop, feel sick to your stomach, have a fever, or have more belly pain. This information is not intended to replace advice given to you by your health care provider. Make sure you discuss any questions you have with your health care provider. Document Released: 12/10/2010 Document Revised: 09/07/2017 Document Reviewed: 08/01/2016 Elsevier Patient Education  2020 [employer].  Diverticulosis  Diverticulosis is a condition that develops when small pouches (diverticula) form in the wall of the large intestine (colon). The colon is where water is absorbed and stool is formed. The pouches form when the inside layer of the colon pushes through weak spots in the outer layers of the colon. You may have a few pouches or many of them. What are the causes? The cause of this condition is not known. What increases the risk? The following factors may make you  more likely to develop this condition: °· Being older than age 60. Your risk for this condition increases with age. Diverticulosis is rare among people younger than age 30. By age 80, many people have it. °· Eating a low-fiber diet. °· Having frequent constipation. °· Being overweight. °· Not getting enough exercise. °· Smoking. °· Taking over-the-counter pain medicines, like aspirin and ibuprofen. °· Having a family history of diverticulosis. °What are the signs or symptoms? °In most people, there are no symptoms of this condition. If you do have symptoms, they may  include: °· Bloating. °· Cramps in the abdomen. °· Constipation or diarrhea. °· Pain in the lower left side of the abdomen. °How is this diagnosed? °This condition is most often diagnosed during an exam for other colon problems. Because diverticulosis usually has no symptoms, it often cannot be diagnosed independently. This condition may be diagnosed by: °· Using a flexible scope to examine the colon (colonoscopy). °· Taking an X-ray of the colon after dye has been put into the colon (barium enema). °· Doing a CT scan. °How is this treated? °You may not need treatment for this condition if you have never developed an infection related to diverticulosis. If you have had an infection before, treatment may include: °· Eating a high-fiber diet. This may include eating more fruits, vegetables, and grains. °· Taking a fiber supplement. °· Taking a live bacteria supplement (probiotic). °· Taking medicine to relax your colon. °· Taking antibiotic medicines. °Follow these instructions at home: °· Drink 6-8 glasses of water or more each day to prevent constipation. °· Try not to strain when you have a bowel movement. °· If you have had an infection before: °? Eat more fiber as directed by your health care provider or your diet and nutrition specialist (dietitian). °? Take a fiber supplement or probiotic, if your health care provider approves. °· Take over-the-counter and prescription medicines only as told by your health care provider. °· If you were prescribed an antibiotic, take it as told by your health care provider. Do not stop taking the antibiotic even if you start to feel better. °· Keep all follow-up visits as told by your health care provider. This is important. °Contact a health care provider if: °· You have pain in your abdomen. °· You have bloating. °· You have cramps. °· You have not had a bowel movement in 3 days. °Get help right away if: °· Your pain gets worse. °· Your bloating becomes very bad. °· You have a  fever or chills, and your symptoms suddenly get worse. °· You vomit. °· You have bowel movements that are bloody or black. °· You have bleeding from your rectum. °Summary °· Diverticulosis is a condition that develops when small pouches (diverticula) form in the wall of the large intestine (colon). °· You may have a few pouches or many of them. °· This condition is most often diagnosed during an exam for other colon problems. °· If you have had an infection related to diverticulosis, treatment may include increasing the fiber in your diet, taking supplements, or taking medicines. °This information is not intended to replace advice given to you by your health care provider. Make sure you discuss any questions you have with your health care provider. °Document Released: 08/04/2004 Document Revised: 10/20/2017 Document Reviewed: 09/26/2016 °Elsevier Patient Education © 2020 Elsevier Inc. ° °Colon Polyps ° °Polyps are tissue growths inside the body. Polyps can grow in many places, including the large intestine (colon). A polyp   may be a round bump or a mushroom-shaped growth. You could have one polyp or several. Most colon polyps are noncancerous (benign). However, some colon polyps can become cancerous over time. Finding and removing the polyps early can help prevent this. What are the causes? The exact cause of colon polyps is not known. What increases the risk? You are more likely to develop this condition if you:  Have a family history of colon cancer or colon polyps.  Are older than 23 or older than 45 if you are African American.  Have inflammatory bowel disease, such as ulcerative colitis or Crohn's disease.  Have certain hereditary conditions, such as: ? Familial adenomatous polyposis. ? Lynch syndrome. ? Turcot syndrome. ? Peutz-Jeghers syndrome.  Are overweight.  Smoke cigarettes.  Do not get enough exercise.  Drink too much alcohol.  Eat a diet that is high in fat and red meat and  low in fiber.  Had childhood cancer that was treated with abdominal radiation. What are the signs or symptoms? Most polyps do not cause symptoms. If you have symptoms, they may include:  Blood coming from your rectum when having a bowel movement.  Blood in your stool. The stool may look dark red or black.  Abdominal pain.  A change in bowel habits, such as constipation or diarrhea. How is this diagnosed? This condition is diagnosed with a colonoscopy. This is a procedure in which a lighted, flexible scope is inserted into the anus and then passed into the colon to examine the area. Polyps are sometimes found when a colonoscopy is done as part of routine cancer screening tests. How is this treated? Treatment for this condition involves removing any polyps that are found. Most polyps can be removed during a colonoscopy. Those polyps will then be tested for cancer. Additional treatment may be needed depending on the results of testing. Follow these instructions at home: Lifestyle  Maintain a healthy weight, or lose weight if recommended by your health care provider.  Exercise every day or as told by your health care provider.  Do not use any products that contain nicotine or tobacco, such as cigarettes and e-cigarettes. If you need help quitting, ask your health care provider.  If you drink alcohol, limit how much you have: ? 0-1 drink a day for women. ? 0-2 drinks a day for men.  Be aware of how much alcohol is in your drink. In the [place] one drink equals one 12 oz bottle of beer (355 mL), one 5 oz glass of wine (148 mL), or one 1 oz shot of hard liquor (44 mL). Eating and drinking   Eat foods that are high in fiber, such as fruits, vegetables, and whole grains.  Eat foods that are high in calcium and vitamin D, such as milk, cheese, yogurt, eggs, liver, fish, and broccoli.  Limit foods that are high in fat, such as fried foods and desserts.  Limit the amount of red meat and  processed meat you eat, such as hot dogs, sausage, bacon, and lunch meats. General instructions  Keep all follow-up visits as told by your health care provider. This is important. ? This includes having regularly scheduled colonoscopies. ? Talk to your health care provider about when you need a colonoscopy. Contact a health care provider if:  You have new or worsening bleeding during a bowel movement.  You have new or increased blood in your stool.  You have a change in bowel habits.  You lose weight for no  known reason. Summary  Polyps are tissue growths inside the body. Polyps can grow in many places, including the colon.  Most colon polyps are noncancerous (benign), but some can become cancerous over time.  This condition is diagnosed with a colonoscopy.  Treatment for this condition involves removing any polyps that are found. Most polyps can be removed during a colonoscopy. This information is not intended to replace advice given to you by your health care provider. Make sure you discuss any questions you have with your health care provider. Document Released: 08/03/2004 Document Revised: 02/22/2018 Document Reviewed: 02/22/2018 Elsevier Patient Education  2020 [employer].

## 2019-08-02 ENCOUNTER — Encounter (HOSPITAL_COMMUNITY): Payer: Self-pay | Admitting: Internal Medicine

## 2019-09-13 ENCOUNTER — Other Ambulatory Visit: Payer: Self-pay | Admitting: Urology

## 2019-09-13 ENCOUNTER — Other Ambulatory Visit: Payer: Self-pay

## 2019-09-13 ENCOUNTER — Ambulatory Visit (INDEPENDENT_AMBULATORY_CARE_PROVIDER_SITE_OTHER): Payer: Medicare Other | Admitting: Urology

## 2019-09-13 DIAGNOSIS — Z8546 Personal history of malignant neoplasm of prostate: Secondary | ICD-10-CM

## 2019-09-13 DIAGNOSIS — N210 Calculus in bladder: Secondary | ICD-10-CM

## 2019-09-20 NOTE — Patient Instructions (Addendum)
DUE TO COVID-19 ONLY ONE VISITOR IS ALLOWED TO COME WITH YOU AND STAY IN THE WAITING ROOM ONLY DURING PRE OP AND PROCEDURE DAY OF SURGERY. THE 1 VISITOR MAY VISIT WITH YOU AFTER SURGERY IN YOUR PRIVATE ROOM DURING VISITING HOURS ONLY!  YOU NEED TO HAVE A COVID 19 TEST ON 09-27-19  @ 3:00 PM, THIS TEST MUST BE DONE BEFORE SURGERY, GO TO [facility]. ONCE YOUR COVID TEST IS COMPLETED, PLEASE BEGIN THE QUARANTINE INSTRUCTIONS AS OUTLINED IN YOUR HANDOUT.                Ralph [name]  09/20/2019   Your procedure is scheduled on: 10-01-19    Report to [facility] Main  Entrance    Report to Admitting at 7:05 AM     Call this number if you have problems the morning of surgery [phone]    Remember: Do not eat food or drink liquids :After Midnight.     Take these medicines the morning of surgery with A SIP OF WATER: Atorvastatin (Lipitor), Isosorbide Mononitrate (Imdur), Metoprolol Tartrate (Lopressor), and Tamsulosin (Flomax)  BRUSH YOUR TEETH MORNING OF SURGERY AND RINSE YOUR MOUTH OUT, NO CHEWING GUM CANDY OR MINTS.                                 You may not have any metal on your body including hair pins and              piercings     Do not wear jewelry, cologne, lotions, powders or deodorant              Men may shave face and neck.   Do not bring valuables to the hospital. [place].  Contacts, dentures or bridgework may not be worn into surgery.     Patients discharged the day of surgery will not be allowed to drive home. IF YOU ARE HAVING SURGERY AND GOING HOME THE SAME DAY, YOU MUST HAVE AN ADULT TO DRIVE YOU HOME AND BE WITH YOU FOR 24 HOURS. YOU MAY GO HOME BY TAXI OR UBER OR ORTHERWISE, BUT AN ADULT MUST ACCOMPANY YOU HOME AND STAY WITH YOU FOR 24 HOURS.  Name and phone number of your driver :Ralph [name] [phone]  Special Instructions: N/A              Please read over the following fact sheets  you were given: _____________________________________________________________________             [facility] - Preparing for Surgery Before surgery, you can play an important role.  Because skin is not sterile, your skin needs to be as free of germs as possible.  You can reduce the number of germs on your skin by washing with CHG (chlorahexidine gluconate) soap before surgery.  CHG is an antiseptic cleaner which kills germs and bonds with the skin to continue killing germs even after washing. Please DO NOT use if you have an allergy to CHG or antibacterial soaps.  If your skin becomes reddened/irritated stop using the CHG and inform your nurse when you arrive at Short Stay. Do not shave (including legs and underarms) for at least 48 hours prior to the first CHG shower.  You may shave your face/neck. Please follow these instructions carefully:  1.  Shower  with CHG Soap the night before surgery and the  morning of Surgery.  2.  If you choose to wash your hair, wash your hair first as usual with your  normal  shampoo.  3.  After you shampoo, rinse your hair and body thoroughly to remove the  shampoo.                           4.  Use CHG as you would any other liquid soap.  You can apply chg directly  to the skin and wash                       Gently with a scrungie or clean washcloth.  5.  Apply the CHG Soap to your body ONLY FROM THE NECK DOWN.   Do not use on face/ open                           Wound or open sores. Avoid contact with eyes, ears mouth and genitals (private parts).                       Wash face,  Genitals (private parts) with your normal soap.             6.  Wash thoroughly, paying special attention to the area where your surgery  will be performed.  7.  Thoroughly rinse your body with warm water from the neck down.  8.  DO NOT shower/wash with your normal soap after using and rinsing off  the CHG Soap.                9.  Pat yourself dry with a clean towel.            10.   Wear clean pajamas.            11.  Place clean sheets on your bed the night of your first shower and do not  sleep with pets. Day of Surgery : Do not apply any lotions/deodorants the morning of surgery.  Please wear clean clothes to the hospital/surgery center.  FAILURE TO FOLLOW THESE INSTRUCTIONS MAY RESULT IN THE CANCELLATION OF YOUR SURGERY PATIENT SIGNATURE_________________________________  NURSE SIGNATURE__________________________________  ________________________________________________________________________

## 2019-09-23 ENCOUNTER — Encounter (HOSPITAL_COMMUNITY): Payer: Self-pay

## 2019-09-23 ENCOUNTER — Other Ambulatory Visit: Payer: Self-pay

## 2019-09-23 ENCOUNTER — Encounter (HOSPITAL_COMMUNITY)
Admission: RE | Admit: 2019-09-23 | Discharge: 2019-09-23 | Disposition: A | Discharge status: Home / Self Care | Payer: Medicare Other | Source: Ambulatory Visit | Attending: Urology | Admitting: Urology

## 2019-09-23 DIAGNOSIS — Z01812 Encounter for preprocedural laboratory examination: Secondary | ICD-10-CM | POA: Diagnosis present

## 2019-09-23 DIAGNOSIS — N210 Calculus in bladder: Secondary | ICD-10-CM | POA: Diagnosis not present

## 2019-09-23 LAB — BASIC METABOLIC PANEL
Anion gap: 7 (ref 5–15)
BUN: 20 mg/dL (ref 8–23)
CO2: 28 mmol/L (ref 22–32)
Calcium: 9.4 mg/dL (ref 8.9–10.3)
Chloride: 105 mmol/L (ref 98–111)
Creatinine, Ser: 1.16 mg/dL (ref 0.61–1.24)
GFR calc Af Amer: >60 mL/min (ref >60)
GFR calc non Af Amer: >60 mL/min (ref >60)
Glucose, Bld: 97 mg/dL (ref 70–99)
Potassium: 4.7 mmol/L (ref 3.5–5.1)
Sodium: 140 mmol/L (ref 135–145)

## 2019-09-23 LAB — CBC
HCT: 41.3 % (ref 39.0–52.0)
Hemoglobin: 13.3 g/dL (ref 13.0–17.0)
MCH: 30.5 pg (ref 26.0–34.0)
MCHC: 32.2 g/dL (ref 30.0–36.0)
MCV: 94.7 fL (ref 80.0–100.0)
Platelets: 199 10*3/uL (ref 150–400)
RBC: 4.36 MIL/uL (ref 4.22–5.81)
RDW: 12 % (ref 11.5–15.5)
WBC: 6.7 10*3/uL (ref 4.0–10.5)
nRBC: 0 % (ref 0.0–0.2)

## 2019-09-27 ENCOUNTER — Other Ambulatory Visit: Payer: Self-pay

## 2019-09-27 ENCOUNTER — Other Ambulatory Visit (HOSPITAL_COMMUNITY)
Admission: RE | Admit: 2019-09-27 | Discharge: 2019-09-27 | Disposition: A | Discharge status: Home / Self Care | Payer: Medicare Other | Source: Ambulatory Visit | Attending: Urology | Admitting: Urology

## 2019-09-27 DIAGNOSIS — Z20828 Contact with and (suspected) exposure to other viral communicable diseases: Secondary | ICD-10-CM | POA: Insufficient documentation

## 2019-09-27 DIAGNOSIS — Z01812 Encounter for preprocedural laboratory examination: Secondary | ICD-10-CM | POA: Diagnosis present

## 2019-09-27 LAB — SARS CORONAVIRUS 2 (TAT 6-24 HRS): SARS Coronavirus 2: NEGATIVE

## 2019-09-30 NOTE — H&P (Signed)
CC/HPI: Flow Symptoms     Ralph [name] returns today in f/u. He saw Fritz Pickerel on 10/1 for flow symptoms that began after a colonoscopy on 07/31/19. He had a clear urine on 10/1 but saw his PCP on 10/8 and had a urine culture that grew e. coli. he was given Cipro x 2 and is on flomax as well. He has had no hematuria. He has some pain with voiding that is better after Cipro. HIs IPSS is 24. He has no pneumoturia. He has a history of prostate cancer with a prior radical prostatectomy in 7/07 and his PSA is <0.015.     ALLERGIES: No Allergies    MEDICATIONS: Aspirin 81 MG TABS Oral  Atorvastatin Calcium 40 mg tablet  Fish Oil 300 mg-1,000 mg capsule,delayed release Oral  Flomax 0.4 mg capsule  Isosorbide Dinitrate 30 mg tablet  Lisinopril 5 MG Oral Tablet Oral  Metoprolol Tartrate 50 MG Oral Tablet Oral     GU PSH: ESWL - 2009 Robotic Radical Prostatectomy - 2009       PSH Notes: Total Knee Replacement Right, Arthroscopy Knee Left, CABG (CABG), Heart Surgery, Prostatect Retropubic Radical W/ Nerve Sparing Laparoscopic, Lithotripsy, Tonsillectomy   NON-GU PSH: CABG (coronary artery bypass grafting) - 2013 Knee Arthroscopy; Dx - 2016 Remove Tonsils - 2009 Total Knee Replacement - 2017     GU PMH: Weak Urinary Stream - 08/22/2019 History of prostate cancer, His PSA is 0.1 but I think the assay is not the super sensitive assay. I will check on that but will get a PSA in 3 months and then if it is stable in 1 year with an OV. - 2018, History of prostate cancer, - 2017 ED due to arterial insufficiency, Erectile dysfunction due to arterial insufficiency - 2017 History of urolithiasis, History of kidney stones - 2017 Microscopic hematuria, Asymptomatic microscopic hematuria - 2017 Stress Incontinence, Male stress incontinence - 2017 Renal cyst, Renal cyst, acquired, left - 2015 Urinary Frequency, Increased urinary frequency - 2014 Urinary Urgency, Urinary urgency - 2014      PMH Notes:  2012-03-23  08:31:32 - Note: Coronary Artery Disease   NON-GU PMH: Personal history of other diseases of the musculoskeletal system and connective tissue, History of arthritis - 2016 Cardiac murmur, unspecified, Murmurs - 2014 Other intervertebral disc degeneration, lumbar region, Lumbar Disc Degeneration - 2014 Personal history of other diseases of the circulatory system, History of hypertension - 2014 Personal history of other endocrine, nutritional and metabolic disease, History of hypercholesterolemia - 2014 Encounter for general adult medical examination without abnormal findings, Encounter for preventive health examination    FAMILY HISTORY: Death In The Family Father - Father Death In The Family Mother - Father Family Health Status Number - Runs In Family Prostate Cancer - No Family History   SOCIAL HISTORY: Marital Status: Married Preferred Language: English; Ethnicity: Not Hispanic Or Latino; Race: White Current Smoking Status: Patient does not smoke anymore. Smoked for 20 years.   Tobacco Use Assessment Completed: Used Tobacco in last 30 days? Social Drinker.  Drinks 4+ caffeinated drinks per day. Patient's occupation is/was Retired.     Notes: Retired From Work, Occupation:, Tobacco Use, Marital History - Currently Married, Alcohol Use, Caffeine Use   REVIEW OF SYSTEMS:    GU Review Male:   Patient denies frequent urination, hard to postpone urination, burning/ pain with urination, get up at night to urinate, leakage of urine, stream starts and stops, trouble starting your stream, have to strain to urinate , erection  problems, and penile pain.  Gastrointestinal (Upper):   Patient denies nausea, vomiting, and indigestion/ heartburn.  Gastrointestinal (Lower):   Patient denies diarrhea and constipation.  Constitutional:   Patient denies fever, night sweats, weight loss, and fatigue.  Skin:   Patient denies skin rash/ lesion and itching.  Eyes:   Patient denies blurred vision and double  vision.  Ears/ Nose/ Throat:   Patient denies sore throat and sinus problems.  Hematologic/Lymphatic:   Patient denies swollen glands and easy bruising.  Cardiovascular:   Patient denies leg swelling and chest pains.  Respiratory:   Patient denies cough and shortness of breath.  Endocrine:   Patient denies excessive thirst.  Musculoskeletal:   Patient denies back pain and joint pain.  Neurological:   Patient denies headaches and dizziness.  Psychologic:   Patient denies anxiety and depression.   VITAL SIGNS:      09/13/2019 10:33 AM  Weight 225 lb / 102.06 kg  Height 68 in / 172.72 cm  BP 162/83 mmHg  Pulse 67 /min  Temperature 97.0 F / 36.1 C  BMI 34.2 kg/m   MULTI-SYSTEM PHYSICAL EXAMINATION:    Constitutional: Well-nourished. No physical deformities. Normally developed. Good grooming.  Respiratory: Normal breath sounds. No labored breathing, no use of accessory muscles.   Cardiovascular: Regular rate and rhythm. No murmur, no gallop.      PAST DATA REVIEWED:  Source Of History:  Patient  Lab Test Review:   PSA  Records Review:   AUA Symptom Score  Urine Test Review:   Urinalysis, Urine Culture   08/26/19 04/03/17 01/13/16 12/03/14 11/30/13 03/23/12 03/31/11 10/01/10  PSA  Total PSA <0.015 ng/mL 0.1 ng/dl <0.01  <0.01  <0.01  <0.01  <0.01  <0.01    Notes:                     UA on 10/8 was clear but he had 25-50K colonies of e. coli.    PROCEDURES:         Flexible Cystoscopy - 52000  Risks, benefits, and some of the potential complications of the procedure were discussed. 81ml of 2% lidocaine jelly was instilled intraurethrally.  Cipro 500mg  given for antibiotic prophylaxis.     Meatus:  Normal size. Normal location. Normal condition.  Urethra:  No strictures.  External Sphincter:  Normal.  Verumontanum:  Verumontanum Surgically Absent.  Prostate:  Prostate Surgically Absent.  Bladder Neck:  Non-obstructing. with a 1.5cm stone resting at the [facility] with some edema or  the mucosa. The stone has staples protruding from the surface.   Ureteral Orifices:  Normal location. Normal size. Normal shape. Effluxed clear urine.  Bladder:  Mild trabeculation. No tumors. Normal mucosa. No stones.      The procedure was well tolerated and there were no complications.   ASSESSMENT:      ICD-10 Details  1 GU:   Bladder Stone - N21.0 His symptoms are secondary to a 1.5cm bladder stone that formed on staples that eroded at the anastomosis and then dropped into the bladder. He will need cystolithalopaxy and I reviewed the risks of bleeding,infection, strictures, incontinence, thrombotic events and anesthetic complications. He was encourage to lie on his side and void into a urinal if he has increased voiding difficulty to get the stone to roll off of the bladder neck.   2   History of prostate cancer - Z85.46    PLAN:           Schedule  Return Visit/Planned Activity: ASAP - Schedule Surgery          Document Letter(s):  Created for Patient: Clinical Summary

## 2019-10-01 ENCOUNTER — Other Ambulatory Visit: Payer: Self-pay

## 2019-10-01 ENCOUNTER — Encounter (HOSPITAL_COMMUNITY)
Admission: RE | Disposition: A | Discharge status: Home / Self Care | Payer: Self-pay | Source: Other Acute Inpatient Hospital | Attending: Urology

## 2019-10-01 ENCOUNTER — Encounter (HOSPITAL_COMMUNITY): Payer: Self-pay | Admitting: Emergency Medicine

## 2019-10-01 ENCOUNTER — Ambulatory Visit (HOSPITAL_COMMUNITY): Payer: Medicare Other | Admitting: Certified Registered Nurse Anesthetist

## 2019-10-01 ENCOUNTER — Ambulatory Visit (HOSPITAL_COMMUNITY): Payer: Medicare Other | Admitting: Physician Assistant

## 2019-10-01 ENCOUNTER — Ambulatory Visit (HOSPITAL_COMMUNITY)
Admission: RE | Admit: 2019-10-01 | Discharge: 2019-10-01 | Disposition: A | Discharge status: Home / Self Care | Payer: Medicare Other | Source: Other Acute Inpatient Hospital | Attending: Urology | Admitting: Urology

## 2019-10-01 DIAGNOSIS — Z8546 Personal history of malignant neoplasm of prostate: Secondary | ICD-10-CM | POA: Insufficient documentation

## 2019-10-01 DIAGNOSIS — I10 Essential (primary) hypertension: Secondary | ICD-10-CM | POA: Insufficient documentation

## 2019-10-01 DIAGNOSIS — Z87891 Personal history of nicotine dependence: Secondary | ICD-10-CM | POA: Diagnosis not present

## 2019-10-01 DIAGNOSIS — I2510 Atherosclerotic heart disease of native coronary artery without angina pectoris: Secondary | ICD-10-CM | POA: Insufficient documentation

## 2019-10-01 DIAGNOSIS — N3289 Other specified disorders of bladder: Secondary | ICD-10-CM | POA: Insufficient documentation

## 2019-10-01 DIAGNOSIS — Z9079 Acquired absence of other genital organ(s): Secondary | ICD-10-CM | POA: Insufficient documentation

## 2019-10-01 DIAGNOSIS — Z96651 Presence of right artificial knee joint: Secondary | ICD-10-CM | POA: Insufficient documentation

## 2019-10-01 DIAGNOSIS — Z87442 Personal history of urinary calculi: Secondary | ICD-10-CM | POA: Diagnosis not present

## 2019-10-01 DIAGNOSIS — I252 Old myocardial infarction: Secondary | ICD-10-CM | POA: Insufficient documentation

## 2019-10-01 DIAGNOSIS — Z951 Presence of aortocoronary bypass graft: Secondary | ICD-10-CM | POA: Diagnosis not present

## 2019-10-01 DIAGNOSIS — N210 Calculus in bladder: Secondary | ICD-10-CM | POA: Insufficient documentation

## 2019-10-01 DIAGNOSIS — N5201 Erectile dysfunction due to arterial insufficiency: Secondary | ICD-10-CM | POA: Insufficient documentation

## 2019-10-01 HISTORY — PX: CYSTOSCOPY WITH LITHOLAPAXY: SHX1425

## 2019-10-01 SURGERY — CYSTOSCOPY, WITH BLADDER CALCULUS LITHOLAPAXY
Anesthesia: General

## 2019-10-01 MED ORDER — SODIUM CHLORIDE 0.9 % IR SOLN
Status: DC | PRN
  Administered 2019-10-01: 3000 mL

## 2019-10-01 MED ORDER — ACETAMINOPHEN 10 MG/ML IV SOLN: 1000.0000 mg | Freq: Once | INTRAVENOUS | Status: DC | PRN

## 2019-10-01 MED ORDER — LACTATED RINGERS IV SOLN
INTRAVENOUS | Status: DC
  Administered 2019-10-01: 07:00:00 via INTRAVENOUS

## 2019-10-01 MED ORDER — ACETAMINOPHEN 325 MG PO TABS: 325.0000 mg | ORAL_TABLET | ORAL | Status: DC | PRN

## 2019-10-01 MED ORDER — DEXAMETHASONE SODIUM PHOSPHATE 10 MG/ML IJ SOLN
INTRAMUSCULAR | Status: AC
  Filled 2019-10-01: qty 1

## 2019-10-01 MED ORDER — ONDANSETRON HCL 4 MG/2ML IJ SOLN
INTRAMUSCULAR | Status: AC
  Filled 2019-10-01: qty 2

## 2019-10-01 MED ORDER — LIDOCAINE 2% (20 MG/ML) 5 ML SYRINGE
INTRAMUSCULAR | Status: AC
  Filled 2019-10-01: qty 5

## 2019-10-01 MED ORDER — ONDANSETRON HCL 4 MG/2ML IJ SOLN: 4.0000 mg | Freq: Once | INTRAMUSCULAR | Status: DC | PRN

## 2019-10-01 MED ORDER — MEPERIDINE HCL 50 MG/ML IJ SOLN: 6.2500 mg | INTRAMUSCULAR | Status: DC | PRN

## 2019-10-01 MED ORDER — PROPOFOL 10 MG/ML IV BOLUS
INTRAVENOUS | Status: AC
  Filled 2019-10-01: qty 20

## 2019-10-01 MED ORDER — LIDOCAINE HCL (CARDIAC) PF 100 MG/5ML IV SOSY
PREFILLED_SYRINGE | INTRAVENOUS | Status: DC | PRN
  Administered 2019-10-01: 100 mg via INTRAVENOUS

## 2019-10-01 MED ORDER — ONDANSETRON HCL 4 MG/2ML IJ SOLN
INTRAMUSCULAR | Status: DC | PRN
  Administered 2019-10-01: 4 mg via INTRAVENOUS

## 2019-10-01 MED ORDER — 0.9 % SODIUM CHLORIDE (POUR BTL) OPTIME
TOPICAL | Status: DC | PRN
  Administered 2019-10-01: 1000 mL

## 2019-10-01 MED ORDER — FENTANYL CITRATE (PF) 100 MCG/2ML IJ SOLN
INTRAMUSCULAR | Status: AC
  Filled 2019-10-01: qty 2

## 2019-10-01 MED ORDER — OXYCODONE HCL 5 MG/5ML PO SOLN: 5.0000 mg | Freq: Once | ORAL | Status: DC | PRN

## 2019-10-01 MED ORDER — EPHEDRINE 5 MG/ML INJ
INTRAVENOUS | Status: AC
  Filled 2019-10-01: qty 10

## 2019-10-01 MED ORDER — FENTANYL CITRATE (PF) 100 MCG/2ML IJ SOLN: 25.0000 ug | INTRAMUSCULAR | Status: DC | PRN

## 2019-10-01 MED ORDER — CIPROFLOXACIN IN D5W 400 MG/200ML IV SOLN
400.0000 mg | INTRAVENOUS | Status: AC
  Administered 2019-10-01: 400 mg via INTRAVENOUS
  Filled 2019-10-01: qty 200

## 2019-10-01 MED ORDER — DEXAMETHASONE SODIUM PHOSPHATE 10 MG/ML IJ SOLN
INTRAMUSCULAR | Status: DC | PRN
  Administered 2019-10-01: 7 mg via INTRAVENOUS

## 2019-10-01 MED ORDER — OXYCODONE HCL 5 MG PO TABS: 5.0000 mg | ORAL_TABLET | Freq: Once | ORAL | Status: DC | PRN

## 2019-10-01 MED ORDER — EPHEDRINE SULFATE-NACL 50-0.9 MG/10ML-% IV SOSY
PREFILLED_SYRINGE | INTRAVENOUS | Status: DC | PRN
  Administered 2019-10-01 (×3): 5 mg via INTRAVENOUS

## 2019-10-01 MED ORDER — SODIUM CHLORIDE 0.9% FLUSH: 3.0000 mL | Freq: Two times a day (BID) | INTRAVENOUS | Status: DC

## 2019-10-01 MED ORDER — PROPOFOL 10 MG/ML IV BOLUS
INTRAVENOUS | Status: DC | PRN
  Administered 2019-10-01: 150 mg via INTRAVENOUS
  Administered 2019-10-01: 50 mg via INTRAVENOUS

## 2019-10-01 MED ORDER — ACETAMINOPHEN 160 MG/5ML PO SOLN: 325.0000 mg | ORAL | Status: DC | PRN

## 2019-10-01 MED ORDER — FENTANYL CITRATE (PF) 100 MCG/2ML IJ SOLN
INTRAMUSCULAR | Status: DC | PRN
  Administered 2019-10-01 (×2): 50 ug via INTRAVENOUS

## 2019-10-01 SURGICAL SUPPLY — 16 items
BAG URO CATCHER STRL LF (MISCELLANEOUS) ×3 IMPLANT
BASKET ZERO TIP NITINOL 2.4FR (BASKET) ×3 IMPLANT
BSKT STON RTRVL ZERO TP 2.4FR (BASKET) ×1
CATH URET 5FR 28IN OPEN ENDED (CATHETERS) IMPLANT
CLOTH BEACON ORANGE TIMEOUT ST (SAFETY) ×3 IMPLANT
FIBER LASER FLEXIVA 1000 (UROLOGICAL SUPPLIES) IMPLANT
FIBER LASER FLEXIVA 550 (UROLOGICAL SUPPLIES) ×3 IMPLANT
GLOVE SURG SS PI 8.0 STRL IVOR (GLOVE) IMPLANT
GOWN STRL REUS W/TWL XL LVL3 (GOWN DISPOSABLE) ×3 IMPLANT
KIT TURNOVER KIT A (KITS) IMPLANT
MANIFOLD NEPTUNE II (INSTRUMENTS) ×3 IMPLANT
PACK CYSTO (CUSTOM PROCEDURE TRAY) ×3 IMPLANT
SYRINGE IRR TOOMEY STRL 70CC (SYRINGE) IMPLANT
TUBING CONNECTING 10 (TUBING) ×2 IMPLANT
TUBING CONNECTING 10' (TUBING) ×1
TUBING UROLOGY SET (TUBING) ×3 IMPLANT

## 2019-10-01 NOTE — Anesthesia Procedure Notes (Signed)
Procedure Name: LMA Insertion Date/Time: 10/01/2019 8:38 AM Performed by: Raenette Rover, CRNA Pre-anesthesia Checklist: Patient identified, Emergency Drugs available, Suction available and Patient being monitored Patient Re-evaluated:Patient Re-evaluated prior to induction Oxygen Delivery Method: Circle system utilized Preoxygenation: Pre-oxygenation with 100% oxygen Induction Type: IV induction LMA: LMA inserted LMA Size: 4.0 Number of attempts: 1 Placement Confirmation: positive ETCO2 and breath sounds checked- equal and bilateral Tube secured with: Tape Dental Injury: Teeth and Oropharynx as per pre-operative assessment

## 2019-10-01 NOTE — Discharge Instructions (Addendum)
CYSTOSCOPY HOME CARE INSTRUCTIONS  Activity: Rest for the remainder of the day.  Do not drive or operate equipment today.  You may resume normal activities in one to two days as instructed by your physician.   Meals: Drink plenty of liquids and eat light foods such as gelatin or soup this evening.  You may return to a normal meal plan tomorrow.  Return to Work: You may return to work in one to two days or as instructed by your physician.  Special Instructions / Symptoms: Call your physician if any of these symptoms occur:   -persistent or heavy bleeding  -bleeding which continues after first few urination  -large blood clots that are difficult to pass  -urine stream diminishes or stops completely  -fever equal to or higher than 101 degrees Farenheit.  -cloudy urine with a strong, foul odor  -severe pain  Females should always wipe from front to back after elimination.  You may feel some burning pain when you urinate.  This should disappear with time.  Applying moist heat to the lower abdomen or a hot tub bath may help relieve the pain. \  You can use tylenol or ibuprofen for pain and you can get Azo (phenazopyridine) over the counter for burning.       Patient Signature:  ________________________________________________________  Nurse's Signature:  ________________________________________________________

## 2019-10-01 NOTE — Interval H&P Note (Signed)
He has no new complaints or changes in his history.

## 2019-10-01 NOTE — Op Note (Signed)
Procedure: Cystoscopy with cystolitholapaxy of 2 cm bladder stone.  Preop diagnosis:  bladder stone.  Postop diagnosis: 2 cm bladder stone.  Surgeon: Dr. Irine Seal.  Anesthesia: General.  Specimen: Stone fragments.  Drain: None.  EBL: None.  Complications: None.  Indications: Ralph [name] is a 68 year old white male who had a radical prostatectomy several years ago.  He recently presented with voiding symptoms and was found to have a bladder stone that had originated on anastomotic staples but it broken free and was in the bladder.  Procedure: He was taken operating room where he was given Cipro.  A general anesthetic was induced.  He was placed in lithotomy position and fitted with PAS hose.  His perineum and genitalia were prepped with Betadine solution he was draped in usual sterile fashion.  Cystoscopy was performed using a 23 [place] scope and 30 degree lens.  Examination revealed a normal urethra.  The external sphincter was intact.  The prostate was absent and there was no bladder neck contracture.  Examination of bladder revealed mild trabeculation without tumors or inflammation but there was a bladder stone at the base the bladder that appeared to be approximately 2 cm in size.  Ureteral orifices were unremarkable.  An initial attempt to remove the stone intact with a 0 tip basket was unsuccessful due to the size of the stone which would not pass the anastomosis.  The stone was then disengaged from the basket.  A 500 m laser fiber was then passed and the holmium laser was set on 2 J and 5 Hz.  The stone was then broken into manageable fragments.  Those fragments were then evacuated from the bladder.  Final inspection revealed some mild hemorrhagic changes in the mucosa at the base the bladder but no active bleeding and no bladder perforation.  The bladder was drained and the scope was removed.  He was taken down from lithotomy position, his anesthetic was reversed and he was moved  recovery in stable condition.  The stone fragments were given to the patient's family.

## 2019-10-01 NOTE — Anesthesia Preprocedure Evaluation (Signed)
Anesthesia Evaluation  Patient identified by MRN, date of birth, ID band Patient awake    Reviewed: Allergy & Precautions, NPO status , Patient's Chart, lab work & pertinent test results  Airway Mallampati: III       Dental  (+) Caps, Teeth Intact   Pulmonary former smoker,    Pulmonary exam normal breath sounds clear to auscultation       Cardiovascular hypertension, Pt. on medications and Pt. on home beta blockers + CAD, + Past MI and + CABG  Normal cardiovascular exam Rhythm:Regular Rate:Normal     Neuro/Psych negative neurological ROS  negative psych ROS   GI/Hepatic negative GI ROS, Neg liver ROS,   Endo/Other  negative endocrine ROS  Renal/GU negative Renal ROS     Musculoskeletal   Abdominal (+) + obese,   Peds  Hematology negative hematology ROS (+)   Anesthesia Other Findings  Notes Recorded by [name], MD on 09/25/2014 at 11:25 AM  Please notify the patient that the echo looks quite good. I'm pleased with the result.      Reproductive/Obstetrics                             Anesthesia Physical Anesthesia Plan  ASA: III  Anesthesia Plan: General   Post-op Pain Management:    Induction: Intravenous  PONV Risk Score and Plan: 3 and Ondansetron, Dexamethasone and Midazolam  Airway Management Planned: LMA  Additional Equipment:   Intra-op Plan:   Post-operative Plan: Extubation in OR  Informed Consent: I have reviewed the patients History and Physical, chart, labs and discussed the procedure including the risks, benefits and alternatives for the proposed anesthesia with the patient or authorized representative who has indicated his/her understanding and acceptance.     Dental advisory given  Plan Discussed with: CRNA  Anesthesia Plan Comments:         Anesthesia Quick Evaluation

## 2019-10-01 NOTE — Transfer of Care (Signed)
Immediate Anesthesia Transfer of Care Note  Patient: Ralph [name]  Procedure(s) Performed: CYSTOSCOPY WITH LITHOLAPAXY, HOLMIUM LASER OF BLADDER STONE (N/A )  Patient Location: PACU  Anesthesia Type:General  Level of Consciousness: awake, alert , oriented and patient cooperative  Airway & Oxygen Therapy: Patient Spontanous Breathing and Patient connected to face mask oxygen  Post-op Assessment: Report given to RN and Post -op Vital signs reviewed and stable  Post vital signs: Reviewed and stable  Last Vitals:  Vitals Value Taken Time  BP 158/85 10/01/19 0922  Temp    Pulse 73 10/01/19 0924  Resp    SpO2 100 % 10/01/19 0924  Vitals shown include unvalidated device data.  Last Pain:  Vitals:   10/01/19 0714  TempSrc:   PainSc: 0-No pain      Patients Stated Pain Goal: 4 ([id number])  Complications: No apparent anesthesia complications

## 2019-10-02 ENCOUNTER — Encounter (HOSPITAL_COMMUNITY): Payer: Self-pay | Admitting: Urology

## 2019-10-02 NOTE — Anesthesia Postprocedure Evaluation (Signed)
Anesthesia Post Note  Patient: [name]  Procedure(s) Performed: CYSTOSCOPY WITH LITHOLAPAXY, HOLMIUM LASER OF BLADDER STONE (N/A )     Patient location during evaluation: PACU Anesthesia Type: General Level of consciousness: awake Pain management: pain level controlled Vital Signs Assessment: post-procedure vital signs reviewed and stable Respiratory status: spontaneous breathing Cardiovascular status: stable Postop Assessment: no apparent nausea or vomiting Anesthetic complications: no    Last Vitals:  Vitals:   10/01/19 1000 10/01/19 1015  BP: 107/65 (!) 157/95  Pulse: 63 63  Resp:  14  Temp:  36.4 C  SpO2: 99% 97%    Last Pain:  Vitals:   10/01/19 1015  TempSrc:   PainSc: 0-No pain   Pain Goal: Patients Stated Pain Goal: 4 (10/01/19 0714)                 Huston Foley

## 2019-10-25 ENCOUNTER — Ambulatory Visit (INDEPENDENT_AMBULATORY_CARE_PROVIDER_SITE_OTHER): Payer: Medicare Other | Admitting: Urology

## 2019-10-25 ENCOUNTER — Other Ambulatory Visit: Payer: Self-pay

## 2019-10-25 DIAGNOSIS — N210 Calculus in bladder: Secondary | ICD-10-CM | POA: Diagnosis not present

## 2019-12-14 ENCOUNTER — Ambulatory Visit: Payer: Medicare Other | Attending: Internal Medicine

## 2019-12-14 DIAGNOSIS — Z23 Encounter for immunization: Secondary | ICD-10-CM | POA: Insufficient documentation

## 2019-12-14 NOTE — Progress Notes (Signed)
   Covid-19 Vaccination Clinic  Name:  Ralph [name]    MRN: [id number] DOB: [birth year]  12/14/2019  [name] was observed post Covid-19 immunization for 15 minutes without incidence. He was provided with Vaccine Information Sheet and instruction to access the V-Safe system.   [name] was instructed to call 911 with any severe reactions post vaccine: Marland Kitchen Difficulty breathing  . Swelling of your face and throat  . A fast heartbeat  . A bad rash all over your body  . Dizziness and weakness    Immunizations Administered    Name Date Dose VIS Date Route   Pfizer COVID-19 Vaccine 12/14/2019 12:59 PM 0.3 mL 11/01/2019 Intramuscular   Manufacturer: [place]   Lot: [id number]   [place]: [id number]

## 2020-01-04 ENCOUNTER — Ambulatory Visit: Payer: Medicare Other | Attending: Internal Medicine

## 2020-01-04 DIAGNOSIS — Z23 Encounter for immunization: Secondary | ICD-10-CM | POA: Insufficient documentation

## 2020-01-04 NOTE — Progress Notes (Signed)
   Covid-19 Vaccination Clinic  Name:  Ralph [name]    MRN: [id number] DOB: [birth year]  01/04/2020  Mr. Ralph [name] was observed post Covid-19 immunization for 15 minutes without incidence. He was provided with Vaccine Information Sheet and instruction to access the V-Safe system.   Mr. Ralph [name] was instructed to call 911 with any severe reactions post vaccine: Marland Kitchen Difficulty breathing  . Swelling of your face and throat  . A fast heartbeat  . A bad rash all over your body  . Dizziness and weakness    Immunizations Administered    Name Date Dose VIS Date Route   Pfizer COVID-19 Vaccine 01/04/2020 12:03 PM 0.3 mL 11/01/2019 Intramuscular   Manufacturer: [place]   Lot: [id number]   [place]: [id number]

## 2020-02-10 ENCOUNTER — Telehealth: Payer: Self-pay | Admitting: Cardiology

## 2020-02-10 NOTE — Telephone Encounter (Signed)
Virtual Visit Pre-Appointment Phone Call  "(Name), I am calling you today to discuss your upcoming appointment. We are currently trying to limit exposure to the virus that causes COVID-19 by seeing patients at home rather than in the office."  1. "What is the BEST phone number to call the day of the visit?" - [phone] 2.  "Do you have or have access to (through a family member/friend) a smartphone with video capability that we can use for your visit?" a. If yes - list this number in appt notes as "cell" (if different from BEST phone #) and list the appointment type as a VIDEO visit in appointment notes b. If no - list the appointment type as a PHONE visit in appointment notes  3. Confirm consent - "In the setting of the current Covid19 crisis, you are scheduled for a (phone or video) visit with your provider on (date) at (time).  Just as we do with many in-office visits, in order for you to participate in this visit, we must obtain consent.  If you'd like, I can send this to your mychart (if signed up) or email for you to review.  Otherwise, I can obtain your verbal consent now.  All virtual visits are billed to your insurance company just like a normal visit would be.  By agreeing to a virtual visit, we'd like you to understand that the technology does not allow for your provider to perform an examination, and thus may limit your provider's ability to fully assess your condition. If your provider identifies any concerns that need to be evaluated in person, we will make arrangements to do so.  Finally, though the technology is pretty good, we cannot assure that it will always work on either your or our end, and in the setting of a video visit, we may have to convert it to a phone-only visit.  In either situation, we cannot ensure that we have a secure connection.  Are you willing to proceed?" STAFF: Did the patient verbally acknowledge consent to telehealth visit? Document YES/NO here:  YES 4.   5. Advise patient to be prepared - "Two hours prior to your appointment, go ahead and check your blood pressure, pulse, oxygen saturation, and your weight (if you have the equipment to check those) and write them all down. When your visit starts, your provider will ask you for this information. If you have an Apple Watch or Kardia device, please plan to have heart rate information ready on the day of your appointment. Please have a pen and paper handy nearby the day of the visit as well."  6. Give patient instructions for MyChart download to smartphone OR Doximity/Doxy.me as below if video visit (depending on what platform provider is using)  7. Inform patient they will receive a phone call 15 minutes prior to their appointment time (may be from unknown caller ID) so they should be prepared to answer    TELEPHONE CALL NOTE  Ralph [name] has been deemed a candidate for a follow-up tele-health visit to limit community exposure during the Covid-19 pandemic. I spoke with the patient via phone to ensure availability of phone/video source, confirm preferred email & phone number, and discuss instructions and expectations.  I reminded Ralph [name] to be prepared with any vital sign and/or heart rhythm information that could potentially be obtained via home monitoring, at the time of his visit. I reminded Ralph [name] to expect a phone call prior to his visit.  Chanda Busing 02/10/2020 1:31 PM   INSTRUCTIONS FOR DOWNLOADING THE MYCHART APP TO SMARTPHONE  - The patient must first make sure to have activated MyChart and know their login information - If Apple, go to [employer] and type in MyChart in the search bar and download the app. If Android, ask patient to go to [employer] and type in [name] in the search bar and download the app. The app is free but as with any other app downloads, their phone may require them to verify saved payment information or Apple/Android  password.  - The patient will need to then log into the app with their MyChart username and password, and select [facility] as their healthcare provider to link the account. When it is time for your visit, go to the MyChart app, find appointments, and click Begin Video Visit. Be sure to Select Allow for your device to access the Microphone and Camera for your visit. You will then be connected, and your provider will be with you shortly.  **If they have any issues connecting, or need assistance please contact MyChart service desk [phone])**  **If using a computer, in order to ensure the best quality for their visit they will need to use either of the following Internet Browsers: [employer], or Google Chrome**  IF USING DOXIMITY or DOXY.ME - The patient will receive a link just prior to their visit by text.     FULL LENGTH CONSENT FOR TELE-HEALTH VISIT   I hereby voluntarily request, consent and authorize [facility] and its employed or contracted physicians, physician assistants, nurse practitioners or other licensed health care professionals (the Practitioner), to provide me with telemedicine health care services (the "Services") as deemed necessary by the treating Practitioner. I acknowledge and consent to receive the Services by the Practitioner via telemedicine. I understand that the telemedicine visit will involve communicating with the Practitioner through live audiovisual communication technology and the disclosure of certain medical information by electronic transmission. I acknowledge that I have been given the opportunity to request an in-person assessment or other available alternative prior to the telemedicine visit and am voluntarily participating in the telemedicine visit.  I understand that I have the right to withhold or withdraw my consent to the use of telemedicine in the course of my care at any time, without affecting my right to future care or treatment,  and that the Practitioner or I may terminate the telemedicine visit at any time. I understand that I have the right to inspect all information obtained and/or recorded in the course of the telemedicine visit and may receive copies of available information for a reasonable fee.  I understand that some of the potential risks of receiving the Services via telemedicine include:  Marland Kitchen Delay or interruption in medical evaluation due to technological equipment failure or disruption; . Information transmitted may not be sufficient (e.g. poor resolution of images) to allow for appropriate medical decision making by the Practitioner; and/or  . In rare instances, security protocols could fail, causing a breach of personal health information.  Furthermore, I acknowledge that it is my responsibility to provide information about my medical history, conditions and care that is complete and accurate to the best of my ability. I acknowledge that Practitioner's advice, recommendations, and/or decision may be based on factors not within their control, such as incomplete or inaccurate data provided by me or distortions of diagnostic images or specimens that may result from electronic transmissions. I understand that the  practice of medicine is not an Chief Strategy Officer and that Practitioner makes no warranties or guarantees regarding treatment outcomes. I acknowledge that I will receive a copy of this consent concurrently upon execution via email to the email address I last provided but may also request a printed copy by calling the office of [facility].    I understand that my insurance will be billed for this visit.   I have read or had this consent read to me. . I understand the contents of this consent, which adequately explains the benefits and risks of the Services being provided via telemedicine.  . I have been provided ample opportunity to ask questions regarding this consent and the Services and have had my questions  answered to my satisfaction. . I give my informed consent for the services to be provided through the use of telemedicine in my medical care  By participating in this telemedicine visit I agree to the above.

## 2020-02-27 ENCOUNTER — Other Ambulatory Visit: Payer: Self-pay | Admitting: Cardiology

## 2020-03-10 ENCOUNTER — Telehealth: Payer: Medicare Other | Admitting: Cardiology

## 2020-03-31 ENCOUNTER — Telehealth: Payer: Medicare Other | Admitting: Cardiology

## 2020-04-09 ENCOUNTER — Ambulatory Visit (INDEPENDENT_AMBULATORY_CARE_PROVIDER_SITE_OTHER): Payer: Medicare Other | Admitting: Cardiology

## 2020-04-09 ENCOUNTER — Encounter: Payer: Self-pay | Admitting: Cardiology

## 2020-04-09 ENCOUNTER — Other Ambulatory Visit: Payer: Self-pay

## 2020-04-09 VITALS — BP 108/60 | HR 64 | Ht 68.0 in | Wt 233.6 lb

## 2020-04-09 DIAGNOSIS — I25119 Atherosclerotic heart disease of native coronary artery with unspecified angina pectoris: Secondary | ICD-10-CM | POA: Diagnosis not present

## 2020-04-09 DIAGNOSIS — R0989 Other specified symptoms and signs involving the circulatory and respiratory systems: Secondary | ICD-10-CM

## 2020-04-09 DIAGNOSIS — E782 Mixed hyperlipidemia: Secondary | ICD-10-CM | POA: Diagnosis not present

## 2020-04-09 NOTE — Patient Instructions (Addendum)
Medication Instructions:   Your physician recommends that you continue on your current medications as directed. Please refer to the Current Medication list given to you today.  Labwork:  NONE  Testing/Procedures: Your physician has requested that you have a carotid duplex. This test is an ultrasound of the carotid arteries in your neck. It looks at blood flow through these arteries that supply the brain with blood. Allow one hour for this exam. There are no restrictions or special instructions.  Follow-Up:  Your physician recommends that you schedule a follow-up appointment in: 1 year (office). You will receive a reminder letter in the mail in about 10 months reminding you to call and schedule your appointment. If you don't receive this letter, please contact our office.  Any Other Special Instructions Will Be Listed Below (If Applicable).  If you need a refill on your cardiac medications before your next appointment, please call your pharmacy.

## 2020-04-09 NOTE — Progress Notes (Signed)
Cardiology Office Note  Date: 04/09/2020   ID: STU MINOTTI, DOB 1951-06-16, MRN [id number]  PCP:  Leeanne Rio, MD  Cardiologist:  Rozann Lesches, MD Electrophysiologist:  None   Chief Complaint  Patient presents with  . Cardiac follow-up    History of Present Illness: Ralph [name] is a 69 y.o. male last seen in March 2020.  He presents for a routine visit.  Since last evaluation he does not report any angina symptoms or nitroglycerin use.  He had recent cataract surgery, reports no other major health change.  He does not describe any palpitations or syncope.  States that he did go through a period of time when he felt lightheaded when he first got up in the morning, but this has subsequently resolved.  I reviewed his medications which are stable and outlined below.  He is due for follow-up lipids this year, I encouraged him to get this checked with his PCP.  His last LDL was 65 in 2019.  I personally reviewed his ECG today which shows normal sinus rhythm.  He is nearly 10 years out from CABG.  Last ischemic testing was in 2018 as noted below, low risk.  Past Medical History:  Diagnosis Date  . Arthritis   . CAD (coronary artery disease)    Multivessel status post CABG December 2011 - Dr. [name]  . Essential hypertension   . History of kidney stones   . Hyperlipidemia   . Neck pain   . NSTEMI (non-ST elevated myocardial infarction) [facility]) December 2011  . Prostate cancer [facility])    Radical prostatectomy    Past Surgical History:  Procedure Laterality Date  . COLONOSCOPY N/A 07/31/2019   Procedure: COLONOSCOPY;  Surgeon: Rogene Houston, MD;  [facility];  Service: Endoscopy;  Laterality: N/A;  830  . CORONARY ARTERY BYPASS GRAFT  December 2011   LIMA to LAD, L Radial to OM2, SVG to DX, SVG to PD  . CYSTOSCOPY WITH LITHOLAPAXY N/A 10/01/2019   Procedure: CYSTOSCOPY WITH LITHOLAPAXY, HOLMIUM LASER OF BLADDER STONE;  Surgeon: Irine Seal, MD;   Location: [facility];  Service: Urology;  Laterality: N/A;  . KNEE ARTHROSCOPY Left 12/18/2013   Procedure: LEFT ARTHROSCOPY KNEE WITH DEBRIDMENT medial and lateral menisectomy condroplasty;  Surgeon: Gearlean Alf, MD;  Location: [facility];  Service: Orthopedics;  Laterality: Left;  . LITHOTRIPSY     X 2  . PARTIAL KNEE ARTHROPLASTY Left 06/01/2015   Procedure: LEFT KNEE MEDIAL UNICOMPARTMENTAL ARTHROPLASTY;  Surgeon: Gaynelle Arabian, MD;  [facility];  Service: Orthopedics;  Laterality: Left;  . POLYPECTOMY  07/31/2019   Procedure: POLYPECTOMY;  Surgeon: Rogene Houston, MD;  [facility];  Service: Endoscopy;;  cecum,ascending,transverse,splenic flexure  . PROSTATECTOMY  2008  . TONSILLECTOMY      Current Outpatient Medications  Medication Sig Dispense Refill  . aspirin 81 MG tablet Take 1 tablet (81 mg total) by mouth daily. 30 tablet   . atorvastatin (LIPITOR) 40 MG tablet Take 1 tablet (40 mg total) by mouth every morning. 90 tablet 3  . isosorbide mononitrate (IMDUR) 30 MG 24 hr tablet TAKE 1 TABLET BY MOUTH EVERY DAY 90 tablet 0  . lisinopril (ZESTRIL) 5 MG tablet TAKE 1 TABLET (5 MG TOTAL) BY MOUTH EVERY MORNING. (Patient taking differently: Take 5 mg by mouth daily. ) 90 tablet 3  . meloxicam (MOBIC) 15 MG tablet Take 15 mg by mouth daily.    . metoprolol  tartrate (LOPRESSOR) 25 MG tablet Take 1 tablet (25 mg total) by mouth 2 (two) times daily. 180 tablet 3  . Omega-3 Fatty Acids (FISH OIL PO) Take 1 capsule by mouth daily.      No current facility-administered medications for this visit.   Allergies:  Patient has no known allergies.   ROS:   No palpitations or syncope.  No claudication.  Physical Exam: VS:  BP 108/60   Pulse 64   Ht 5\' 8"  (1.727 m)   Wt 233 lb 9.6 oz (106 kg)   SpO2 98%   BMI 35.52 kg/m , BMI Body mass index is 35.52 kg/m.  Wt Readings from Last 3 Encounters:  04/09/20 233 lb 9.6 oz (106 kg)  10/01/19 231 lb 8 oz (105 kg)  09/23/19 231 lb 8  oz (105 kg)    General: Patient appears comfortable at rest. HEENT: Conjunctiva and lids normal, wearing a mask. Neck: Supple, no elevated JVP, right carotid bruit, no thyromegaly. Lungs: Clear to auscultation, nonlabored breathing at rest. Cardiac: Regular rate and rhythm, no S3, soft systolic murmur, no pericardial rub. Abdomen: Soft, bowel sounds present. Extremities: No pitting edema, distal pulses 2+.  ECG:  An ECG dated 01/22/2019 was personally reviewed today and demonstrated:  Sinus bradycardia with LVH and probable old inferior infarct pattern, nonspecific ST changes.  Recent Labwork: 09/23/2019: BUN 20; Creatinine, Ser 1.16; Hemoglobin 13.3; Platelets 199; Potassium 4.7; Sodium 140  December 2019: Cholesterol 154, triglycerides 130, HDL 63, LDL 65  Other Studies Reviewed Today:  Lexiscan Myoview 01/17/2017:  No diagnostic ST segment changes over baseline abnormalities.  Small, mild intensity, mid to apical inferolateral defect that is reversible and consistent with ischemia.  This is a low risk study.  Nuclear stress EF: 67%.  Assessment and Plan:  1.  Multivessel CAD status post CABG in 2011.  He does not report any active angina at this time and had a low risk Myoview in 2018.  ECG is normal today.  Aspirin, Imdur, lisinopril, Lopressor, and Lipitor.  2.  Right carotid bruit.  Follow-up carotid Dopplers will be obtained.  He is already on aspirin and statin therapy.  3.  Mixed hyperlipidemia, on Lipitor.  Last LDL was 65 in 2019.  I encouraged him to follow-up with PCP for repeat check.  Medication Adjustments/Labs and Tests Ordered: Current medicines are reviewed at length with the patient today.  Concerns regarding medicines are outlined above.   Tests Ordered: Orders Placed This Encounter  Procedures  . EKG 12-Lead  . VAS US CAROTID    Medication Changes: No orders of the defined types were placed in this encounter.   Disposition:  Follow up 1 year in the  [place] office.  Signed, Satira Sark, MD, [facility] 04/09/2020 12:08 PM    [facility] [address], [place], [place] [address] Phone: [phone]; Fax: [phone]

## 2020-05-14 ENCOUNTER — Other Ambulatory Visit: Payer: Self-pay

## 2020-05-14 ENCOUNTER — Ambulatory Visit (INDEPENDENT_AMBULATORY_CARE_PROVIDER_SITE_OTHER): Payer: Medicare Other

## 2020-05-14 DIAGNOSIS — R0989 Other specified symptoms and signs involving the circulatory and respiratory systems: Secondary | ICD-10-CM | POA: Diagnosis not present

## 2020-05-19 ENCOUNTER — Telehealth: Payer: Self-pay | Admitting: *Deleted

## 2020-05-19 DIAGNOSIS — R0989 Other specified symptoms and signs involving the circulatory and respiratory systems: Secondary | ICD-10-CM

## 2020-05-19 NOTE — Addendum Note (Signed)
Addended by: Merlene Laughter on: 05/19/2020 03:11 PM   Modules accepted: Orders

## 2020-05-19 NOTE — Telephone Encounter (Signed)
Pt voiced understanding

## 2020-05-19 NOTE — Telephone Encounter (Signed)
-----   Message from Satira Sark, MD sent at 05/16/2020 10:15 AM EDT ----- Results reviewed.  Mild bilateral ICA stenoses, 1 to 39%.  Continue with medical therapy and current follow-up plan.

## 2020-05-24 ENCOUNTER — Other Ambulatory Visit: Payer: Self-pay | Admitting: Cardiology

## 2020-05-28 ENCOUNTER — Other Ambulatory Visit: Payer: Self-pay | Admitting: Cardiology

## 2020-06-18 ENCOUNTER — Other Ambulatory Visit: Payer: Self-pay | Admitting: Cardiology

## 2020-12-27 ENCOUNTER — Other Ambulatory Visit: Payer: Self-pay | Admitting: Cardiology

## 2021-05-21 ENCOUNTER — Other Ambulatory Visit: Payer: Self-pay | Admitting: Cardiology

## 2021-05-27 NOTE — Progress Notes (Signed)
Cardiology Office Note  Date: 05/28/2021   ID: Ralph [name], DOB June 19, 1951, MRN [id number]  PCP:  Leeanne Rio, MD  Cardiologist:  Rozann Lesches, MD Electrophysiologist:  None   Chief Complaint: 1 year follow-up  History of Present Illness: Ralph [name] is a 70 y.o. male with a history of CAD/NSTEMI/CABG, HTN, HLD, prostate CAD, kidney stones, arthritis.  He was last seen by Dr. Domenic Polite on 04/09/2020.  He did not report any anginal symptoms or nitroglycerin use.  No palpitations or syncope.  Had a period when he felt lightheaded earlier in the morning upon first arising but this had resolved.  His last LDL was 65 and 2019.  At that time he was 10 years out from CABG.  He is here for 1 year follow-up today he denies any recent acute illnesses or hospitalizations.  His only complaint is when going from a sitting position to a standing position on occasion he will feel suddenly as though he may "blackout".  He states he usually stands for a few seconds and this goes away.  His heart rate is in the low 50 range actually at 50 today.  He states these episodes occur sporadically and are not predictable.  They do not occur with a predictable pattern.  He denies any anginal or exertional symptoms, palpitations or arrhythmias, orthostatic symptoms other than above-mentioned.  Denies any PND orthopnea, bleeding.  Denies any claudication-like symptoms, DVT or PE-like symptoms, lower extremity edema.  He states his labs at last check were all good.  He states he plays golf several times a week and is very active  Past Medical History:  Diagnosis Date   Arthritis    CAD (coronary artery disease)    Multivessel status post CABG December 2011 - Dr. [name]   Essential hypertension    History of kidney stones    Hyperlipidemia    Neck pain    NSTEMI (non-ST elevated myocardial infarction) [facility]) December 2011   Prostate cancer [facility])    Radical prostatectomy    Past Surgical History:   Procedure Laterality Date   COLONOSCOPY N/A 07/31/2019   Procedure: COLONOSCOPY;  Surgeon: Rogene Houston, MD;  [facility];  Service: Endoscopy;  Laterality: N/A;  61   CORONARY ARTERY BYPASS GRAFT  December 2011   LIMA to LAD, L Radial to OM2, SVG to DX, SVG to PD   CYSTOSCOPY WITH LITHOLAPAXY N/A 10/01/2019   Procedure: CYSTOSCOPY WITH LITHOLAPAXY, HOLMIUM LASER OF BLADDER STONE;  Surgeon: Irine Seal, MD;  [facility];  Service: Urology;  Laterality: N/A;   KNEE ARTHROSCOPY Left 12/18/2013   Procedure: LEFT ARTHROSCOPY KNEE WITH DEBRIDMENT medial and lateral menisectomy condroplasty;  Surgeon: Gearlean Alf, MD;  [facility];  Service: Orthopedics;  Laterality: Left;   LITHOTRIPSY     X 2   PARTIAL KNEE ARTHROPLASTY Left 06/01/2015   Procedure: LEFT KNEE MEDIAL UNICOMPARTMENTAL ARTHROPLASTY;  Surgeon: Gaynelle Arabian, MD;  [facility];  Service: Orthopedics;  Laterality: Left;   POLYPECTOMY  07/31/2019   Procedure: POLYPECTOMY;  Surgeon: Rogene Houston, MD;  [facility];  Service: Endoscopy;;  cecum,ascending,transverse,splenic flexure   PROSTATECTOMY  2008   TONSILLECTOMY      Current Outpatient Medications  Medication Sig Dispense Refill   aspirin 81 MG tablet Take 1 tablet (81 mg total) by mouth daily. 30 tablet    atorvastatin (LIPITOR) 40 MG tablet TAKE 1 TABLET BY MOUTH EVERY DAY IN  THE MORNING 30 tablet 0   isosorbide mononitrate (IMDUR) 30 MG 24 hr tablet TAKE 1 TABLET BY MOUTH EVERY DAY 90 tablet 1   lisinopril (ZESTRIL) 5 MG tablet TAKE 1 TABLET (5 MG TOTAL) BY MOUTH EVERY MORNING. 90 tablet 3   meloxicam (MOBIC) 15 MG tablet Take 15 mg by mouth daily.     metoprolol tartrate (LOPRESSOR) 25 MG tablet Take 0.5 tablets (12.5 mg total) by mouth 2 (two) times daily. 90 tablet 3   Omega-3 Fatty Acids (FISH OIL PO) Take 1 capsule by mouth daily.      No current facility-administered medications for this visit.   Allergies:  Patient has no  known allergies.   Social History: The patient  reports that he quit smoking about 32 years ago. His smoking use included cigarettes. He started smoking about 42 years ago. He has a 8.00 pack-year smoking history. He has never used smokeless tobacco. He reports that he does not drink alcohol and does not use drugs.   Family History: The patient's family history includes Alzheimer's disease in his mother; Stroke in his father.   ROS:  Please see the history of present illness. Otherwise, complete review of systems is positive for none.  All other systems are reviewed and negative.   Physical Exam: VS:  BP 124/72   Pulse (!) 50   Ht 5\' 8"  (1.727 m)   Wt 207 lb (93.9 kg)   SpO2 97%   BMI 31.47 kg/m , BMI Body mass index is 31.47 kg/m.  Wt Readings from Last 3 Encounters:  05/28/21 207 lb (93.9 kg)  04/09/20 233 lb 9.6 oz (106 kg)  10/01/19 231 lb 8 oz (105 kg)    General: Patient appears comfortable at rest. Neck: Supple, no elevated JVP or carotid bruits, no thyromegaly. Lungs: Clear to auscultation, nonlabored breathing at rest. Cardiac: Regular rate and rhythm, no S3 or significant systolic murmur, no pericardial rub. Extremities: No pitting edema, distal pulses 2+. Skin: Warm and dry. Musculoskeletal: No kyphosis. Neuropsychiatric: Alert and oriented x3, affect grossly appropriate.  ECG: May 28, 2021 sinus bradycardia with a rate of 50.  Recent Labwork: No results found for requested labs within last 8760 hours.     Component Value Date/Time   CHOL 183 03/24/2011 1023   TRIG 89.0 03/24/2011 1023   HDL 54.30 03/24/2011 1023   CHOLHDL 3 03/24/2011 1023   VLDL 17.8 03/24/2011 1023   LDLCALC 111 (H) 03/24/2011 1023    Other Studies Reviewed Today:   Carotid artery duplex study 05/14/2020 Right Carotid: Velocities in the right ICA are consistent with a 1-39% stenosis. Left Carotid: Velocities in the left ICA are consistent with a 1-39% stenosis. Non-hemodynamically  significant plaque <50% noted in the CCA. Vertebrals: Bilateral vertebral arteries demonstrate antegrade flow. Subclavians:Normal flow hemodynamics were seen in bilateral subclavian arteries.  Lexiscan Myoview 01/17/2017: No diagnostic ST segment changes over baseline abnormalities. Small, mild intensity, mid to apical inferolateral defect that is reversible and consistent with ischemia. This is a low risk study. Nuclear stress EF: 67%.    Assessment and Plan:  1. CAD in native artery   2. Carotid bruit, unspecified laterality   3. Sinus bradycardia    1. CAD in native artery Denies any anginal or exertional symptoms.  Continue aspirin 81 mg daily.  He states he is very active playing golf several times a week without any anginal or exertional symptoms.  2. Carotid bruit, unspecified laterality Previous carotid bruit carotid artery  duplex study on 05/14/2020 demonstrating bilateral ICA stenosis of 1 to 39%.  No carotid bruits heard on exam today  3. Sinus bradycardia EKG today shows bradycardia with a rate of 50.  Patient states he has some brief episodes when going from sitting to standing where he very briefly feels like he may blackout.  Due to slow heart rate we will decrease metoprolol to 12.5 mg p.o. twice daily hopefully this will help with feeling of sporadic lightheadedness/"blackout" sensations.  Advised him if episodes of these "blackout" symptoms persist to call us and let us know.  We may need to do some further testing i.e. possible cardiac monitor/echocardiogram   Medication Adjustments/Labs and Tests Ordered: Current medicines are reviewed at length with the patient today.  Concerns regarding medicines are outlined above.   Disposition: Follow-up with Dr. Domenic Polite or APP 1 year  Signed, Levell July, NP 05/28/2021 10:57 AM    [place] [facility] [place], [place], [place] [address] Phone: [phone]; Fax: [phone]

## 2021-05-28 ENCOUNTER — Encounter: Payer: Self-pay | Admitting: Family Medicine

## 2021-05-28 ENCOUNTER — Ambulatory Visit (INDEPENDENT_AMBULATORY_CARE_PROVIDER_SITE_OTHER): Payer: Medicare Other | Admitting: Family Medicine

## 2021-05-28 VITALS — BP 124/72 | HR 50 | Ht 68.0 in | Wt 207.0 lb

## 2021-05-28 DIAGNOSIS — I2510 Atherosclerotic heart disease of native coronary artery without angina pectoris: Secondary | ICD-10-CM

## 2021-05-28 DIAGNOSIS — R001 Bradycardia, unspecified: Secondary | ICD-10-CM

## 2021-05-28 DIAGNOSIS — R0989 Other specified symptoms and signs involving the circulatory and respiratory systems: Secondary | ICD-10-CM | POA: Diagnosis not present

## 2021-05-28 MED ORDER — METOPROLOL TARTRATE 25 MG PO TABS: 12.5000 mg | ORAL_TABLET | Freq: Two times a day (BID) | ORAL | 3 refills | Status: DC

## 2021-05-28 NOTE — Patient Instructions (Signed)
Medication Instructions:  Your physician has recommended you make the following change in your medication:  DECREASE Metoprolol to 12.5 mg tablets twice daily  *If you need a refill on your cardiac medications before your next appointment, please call your pharmacy*   Lab Work: None If you have labs (blood work) drawn today and your tests are completely normal, you will receive your results only by: [place] (if you have MyChart) OR A paper copy in the mail If you have any lab test that is abnormal or we need to change your treatment, we will call you to review the results.   Testing/Procedures: None   Follow-Up: At [facility], you and your health needs are our priority.  As part of our continuing mission to provide you with exceptional heart care, we have created designated Provider Care Teams.  These Care Teams include your primary Cardiologist (physician) and Advanced Practice Providers (APPs -  Physician Assistants and Nurse Practitioners) who all work together to provide you with the care you need, when you need it.  We recommend signing up for the patient portal called "MyChart".  Sign up information is provided on this After Visit Summary.  MyChart is used to connect with patients for Virtual Visits (Telemedicine).  Patients are able to view lab/test results, encounter notes, upcoming appointments, etc.  Non-urgent messages can be sent to your provider as well.   To learn more about what you can do with MyChart, go to [email or web address].    Your next appointment:   12 month(s)  The format for your next appointment:   In Person  Provider:   Katina Dung, NP   Other Instructions

## 2021-06-12 ENCOUNTER — Other Ambulatory Visit: Payer: Self-pay | Admitting: Cardiology

## 2021-06-14 ENCOUNTER — Other Ambulatory Visit: Payer: Self-pay | Admitting: Cardiology

## 2021-06-24 ENCOUNTER — Ambulatory Visit: Payer: Medicare Other | Admitting: Family Medicine

## 2021-06-25 ENCOUNTER — Other Ambulatory Visit: Payer: Self-pay | Admitting: Cardiology

## 2022-03-29 ENCOUNTER — Telehealth: Payer: Self-pay | Admitting: *Deleted

## 2022-03-29 NOTE — Telephone Encounter (Signed)
? ?  Pre-operative Risk Assessment  ?  ?Patient Name: Ralph [name]  ?DOB: 10/31/1951 ?MRN: [id number]  ? ?  ? ?Request for Surgical Clearance   ? ?Procedure:   Rt total knee replacement ? ?Date of Surgery:  Clearance TBD                              ?   ?Surgeon:  Charlies Constable, MD ?Surgeon's Group or Practice Name:  [facility] ?Phone number:  [phone] ?Fax number:  [phone] ?  ?Type of Clearance Requested:   ?- Medical  ?  ?Type of Anesthesia:  Spinal ?  ?Additional requests/questions:   ? ?Signed, ?Marlou Sa   ?03/29/2022, 10:54 AM  ?

## 2022-03-29 NOTE — Telephone Encounter (Signed)
? ?  Name: Ralph [name]  ?DOB: 1951/04/24  ?MRN: [id number] ? ?Primary Cardiologist: Rozann Lesches, MD ? ?Chart reviewed as part of pre-operative protocol coverage. Because of [name] past medical history and time since last visit, he will require a follow-up in-office visit in order to better assess preoperative cardiovascular risk. ? ?Pre-op covering staff: ?- Please schedule appointment and call patient to inform them. If patient already had an upcoming appointment within acceptable timeframe, please add "pre-op clearance" to the appointment notes so provider is aware. ?- Please contact requesting surgeon's office via preferred method (i.e, phone, fax) to inform them of need for appointment prior to surgery. ? ? ?[name], [place]  ?03/29/2022, 3:22 PM  ? ?

## 2022-03-30 NOTE — Progress Notes (Addendum)
Cardiology Office Note    Date:  04/06/2022   ID:  Weldon Inches, DOB May 24, 1951, MRN [id number]   PCP:  Leeanne Rio, [place]  Cardiologist:  Rozann Lesches, MD   Advanced Practice Provider:  No care team member to display Electrophysiologist:  None   [phone]   No chief complaint on file.   History of Present Illness:  Ralph [name] is a 71 y.o. male  with history of CAD S/P CABG 10/2010, low risk NST 2018, HLD, right carotid bruit-1-39% carotid stenosis.  Patient on my schedule for perop clearance for Rt total knee replacement by Dr. Charlies Constable. Patient denies chest pain, dyspnea, palpitations, edema. Plays golf and walks 18 holes twice a week without chest pain. BP up today, hasn't been checking at home. Does eat fast food.     Past Medical History:  Diagnosis Date   Arthritis    CAD (coronary artery disease)    Multivessel status post CABG December 2011 - Dr. [name]   Essential hypertension    History of kidney stones    Hyperlipidemia    Neck pain    NSTEMI (non-ST elevated myocardial infarction) [facility]) December 2011   Prostate cancer [facility])    Radical prostatectomy    Past Surgical History:  Procedure Laterality Date   COLONOSCOPY N/A 07/31/2019   Procedure: COLONOSCOPY;  Surgeon: Rogene Houston, MD;  Location: [facility];  Service: Endoscopy;  Laterality: N/A;  85   CORONARY ARTERY BYPASS GRAFT  December 2011   LIMA to LAD, L Radial to OM2, SVG to DX, SVG to PD   CYSTOSCOPY WITH LITHOLAPAXY N/A 10/01/2019   Procedure: CYSTOSCOPY WITH LITHOLAPAXY, HOLMIUM LASER OF BLADDER STONE;  Surgeon: Irine Seal, MD;  Location: [facility] ORS;  Service: Urology;  Laterality: N/A;   KNEE ARTHROSCOPY Left 12/18/2013   Procedure: LEFT ARTHROSCOPY KNEE WITH DEBRIDMENT medial and lateral menisectomy condroplasty;  Surgeon: Gearlean Alf, MD;  Location: [facility];  Service: Orthopedics;  Laterality: Left;   LITHOTRIPSY     X 2    PARTIAL KNEE ARTHROPLASTY Left 06/01/2015   Procedure: LEFT KNEE MEDIAL UNICOMPARTMENTAL ARTHROPLASTY;  Surgeon: Gaynelle Arabian, MD;  Location: [facility];  Service: Orthopedics;  Laterality: Left;   POLYPECTOMY  07/31/2019   Procedure: POLYPECTOMY;  Surgeon: Rogene Houston, MD;  Location: [facility];  Service: Endoscopy;;  cecum,ascending,transverse,splenic flexure   PROSTATECTOMY  2008   TONSILLECTOMY      Current Medications: Current Meds  Medication Sig   aspirin 81 MG tablet Take 1 tablet (81 mg total) by mouth daily.   atorvastatin (LIPITOR) 40 MG tablet TAKE 1 TABLET BY MOUTH EVERY DAY IN THE MORNING   celecoxib (CELEBREX) 200 MG capsule Take 200 mg by mouth daily.   isosorbide mononitrate (IMDUR) 30 MG 24 hr tablet TAKE 1 TABLET BY MOUTH EVERY DAY   lisinopril (ZESTRIL) 5 MG tablet TAKE 1 TABLET (5 MG TOTAL) BY MOUTH EVERY MORNING.   metoprolol tartrate (LOPRESSOR) 25 MG tablet Take 0.5 tablets (12.5 mg total) by mouth 2 (two) times daily.   Omega-3 Fatty Acids (FISH OIL PO) Take 1 capsule by mouth daily.      Allergies:   Patient has no known allergies.   Social History   Socioeconomic History   Marital status: Married    Spouse name: Not on file   Number of children: Not on file   Years of education: Not on file  Highest education level: Not on file  Occupational History   Not on file  Tobacco Use   Smoking status: Former    Packs/day: 0.80    Years: 10.00    Pack years: 8.00    Types: Cigarettes    Start date: 11/21/1978    Quit date: 11/21/1988    Years since quitting: 33.3   Smokeless tobacco: Never   Tobacco comments:    Stopped over 20 years ago.  Vaping Use   Vaping Use: Never used  Substance and Sexual Activity   Alcohol use: No    Alcohol/week: 0.0 standard drinks   Drug use: No   Sexual activity: Not on file  Other Topics Concern   Not on file  Social History Narrative   Not on file   Social Determinants of Health   Financial Resource Strain: Not  on file  Food Insecurity: Not on file  Transportation Needs: Not on file  Physical Activity: Not on file  Stress: Not on file  Social Connections: Not on file     Family History:  The patient's  family history includes Alzheimer's disease in his mother; Stroke in his father.   ROS:   Please see the history of present illness.    ROS All other systems reviewed and are negative.   PHYSICAL EXAM:   VS:  BP (!) 148/90   Pulse (!) 50   Ht '5\' 8"'$  (1.727 m)   Wt 223 lb 6.4 oz (101.3 kg)   SpO2 96%   BMI 33.97 kg/m   Physical Exam  GEN: Well nourished, well developed, in no acute distress  Neck: left carotid bruit,no JVD,  or masses Cardiac:RRR; no murmurs, rubs, or gallops  Respiratory:  clear to auscultation bilaterally, normal work of breathing GI: soft, nontender, nondistended, + BS Ext: without cyanosis, clubbing, or edema, Good distal pulses bilaterally Neuro:  Alert and Oriented x 3, Psych: euthymic mood, full affect  Wt Readings from Last 3 Encounters:  04/06/22 223 lb 6.4 oz (101.3 kg)  05/28/21 207 lb (93.9 kg)  04/09/20 233 lb 9.6 oz (106 kg)      Studies/Labs Reviewed:   EKG:  EKG is  ordered today.  The ekg ordered today demonstrates sinus bradycardia 50/m first degree AV block  Recent Labs: No results found for requested labs within last 8760 hours.   Lipid Panel    Component Value Date/Time   CHOL 183 03/24/2011 1023   TRIG 89.0 03/24/2011 1023   HDL 54.30 03/24/2011 1023   CHOLHDL 3 03/24/2011 1023   VLDL 17.8 03/24/2011 1023   LDLCALC 111 (H) 03/24/2011 1023    Additional studies/ records that were reviewed today include:  NST 04/20/22   The study is normal. There are no perfusion defects.  The study is low risk.   No ST deviation was noted.   LV perfusion is normal.   Left ventricular function is normal. Nuclear stress EF: 68 %. The left ventricular ejection fraction is hyperdynamic (>65%). End diastolic cavity size is normal.    Carotid  dopplers 05/14/20 Summary:  Right Carotid: Velocities in the right ICA are consistent with a 1-39%  stenosis.   Left Carotid: Velocities in the left ICA are consistent with a 1-39%  stenosis.               Non-hemodynamically significant plaque <50% noted in the  CCA.   Vertebrals: Bilateral vertebral arteries demonstrate antegrade flow.  Subclavians: Normal flow hemodynamics were seen in  bilateral subclavian               arteries.   *See table(s) above for measurements and observations.      Lexiscan Myoview 01/17/2017: No diagnostic ST segment changes over baseline abnormalities. Small, mild intensity, mid to apical inferolateral defect that is reversible and consistent with ischemia. This is a low risk study. Nuclear stress EF: 67%.   Risk Assessment/Calculations:         ASSESSMENT:    1. Preoperative clearance   2. CAD in native artery   3. Mixed hyperlipidemia   4. Carotid bruit, unspecified laterality   5. Prediabetes      PLAN:  In order of problems listed above:  Preop clearance for Rt total knee replacement by Dr. Charlies Constable. Patient with history of CAD-11 yr bypass grafts, ischemia on stress test in 2018 although low risk and treated medically, prediabetes, elevated BP today.METs are >7 . Will do exercise NST and convert to lexi if needed.   Addendum: NST low risk, no ischemia, can proceed with the above surgery without any further testing.  According to the Revised Cardiac Risk Index (RCRI), his Perioperative Risk of Major Cardiac Event is (%): 0.9  His Functional Capacity in METs is: 7.34 according to the Duke Activity Status Index (DASI).   CAD S/P CABG 10/2010, low risk NST 2018  HLD-recent labs by PCP 01/2022 LDL 76.   Left carotid bruit-dopplers 05/14/20 1-39% bilateral carotid stenosis-will check today.  Prediabetes A1C 6.2 labs 01/2022 and 20 lb weight gain. Diet and exercise discussed.  Shared Decision Making/Informed Consent    Shared Decision Making/Informed Consent The risks [chest pain, shortness of breath, cardiac arrhythmias, dizziness, blood pressure fluctuations, myocardial infarction, stroke/transient ischemic attack, nausea, vomiting, allergic reaction, radiation exposure, metallic taste sensation and life-threatening complications (estimated to be 1 in 10,000)], benefits (risk stratification, diagnosing coronary artery disease, treatment guidance) and alternatives of a nuclear stress test were discussed in detail with Mr. Mierzwa and he agrees to proceed.    Medication Adjustments/Labs and Tests Ordered: Current medicines are reviewed at length with the patient today.  Concerns regarding medicines are outlined above.  Medication changes, Labs and Tests ordered today are listed in the Patient Instructions below. Patient Instructions  Medication Instructions:  Your physician recommends that you continue on your current medications as directed. Please refer to the Current Medication list given to you today.  *If you need a refill on your cardiac medications before your next appointment, please call your pharmacy*   Lab Work: NONE   If you have labs (blood work) drawn today and your tests are completely normal, you will receive your results only by: [place] (if you have MyChart) OR A paper copy in the mail If you have any lab test that is abnormal or we need to change your treatment, we will call you to review the results.   Testing/Procedures: Your physician has requested that you have en exercise stress myoview. For further information please visit [email or web address]. Please follow instruction sheet, as given.   Your physician has requested that you have a carotid duplex. This test is an ultrasound of the carotid arteries in your neck. It looks at blood flow through these arteries that supply the brain with blood. Allow one hour for this exam. There are no restrictions or special  instructions.    Follow-Up: At [facility], you and your health needs are our priority.  As part of our continuing mission to provide you  with exceptional heart care, we have created designated Provider Care Teams.  These Care Teams include your primary Cardiologist (physician) and Advanced Practice Providers (APPs -  Physician Assistants and Nurse Practitioners) who all work together to provide you with the care you need, when you need it.  We recommend signing up for the patient portal called "MyChart".  Sign up information is provided on this After Visit Summary.  MyChart is used to connect with patients for Virtual Visits (Telemedicine).  Patients are able to view lab/test results, encounter notes, upcoming appointments, etc.  Non-urgent messages can be sent to your provider as well.   To learn more about what you can do with MyChart, go to [email or web address].    Your next appointment:   1 year(s)  The format for your next appointment:   In Person  Provider:   Rozann Lesches, MD    Other Instructions Thank you for choosing [facility]!    Please monitor your blood pressure and notify out office if they start to trend higher that 135/85.   Important Information About Sugar      Two Gram Sodium Diet 2000 mg  What is Sodium? Sodium is a mineral found naturally in many foods. The most significant source of sodium in the diet is table salt, which is about 40% sodium.  Processed, convenience, and preserved foods also contain a large amount of sodium.  The body needs only 500 mg of sodium daily to function,  A normal diet provides more than enough sodium even if you do not use salt.  Why Limit Sodium? A build up of sodium in the body can cause thirst, increased blood pressure, shortness of breath, and water retention.  Decreasing sodium in the diet can reduce edema and risk of heart attack or stroke associated with high blood pressure.  Keep in mind that there are  many other factors involved in these health problems.  Heredity, obesity, lack of exercise, cigarette smoking, stress and what you eat all play a role.  General Guidelines: Do not add salt at the table or in cooking.  One teaspoon of salt contains over 2 grams of sodium. Read food labels Avoid processed and convenience foods Ask your dietitian before eating any foods not dicussed in the menu planning guidelines Consult your physician if you wish to use a salt substitute or a sodium containing medication such as antacids.  Limit milk and milk products to 16 oz (2 cups) per day.  Shopping Hints: READ LABELS!! "Dietetic" does not necessarily mean low sodium. Salt and other sodium ingredients are often added to foods during processing.    Menu Planning Guidelines Food Group Choose More Often Avoid  Beverages (see also the milk group All fruit juices, low-sodium, salt-free vegetables juices, low-sodium carbonated beverages Regular vegetable or tomato juices, commercially softened water used for drinking or cooking  Breads and Cereals Enriched white, wheat, rye and pumpernickel bread, hard rolls and dinner rolls; muffins, cornbread and waffles; most dry cereals, cooked cereal without added salt; unsalted crackers and breadsticks; low sodium or homemade bread crumbs Bread, rolls and crackers with salted tops; quick breads; instant hot cereals; pancakes; commercial bread stuffing; self-rising flower and biscuit mixes; regular bread crumbs or cracker crumbs  Desserts and Sweets Desserts and sweets mad with mild should be within allowance Instant pudding mixes and cake mixes  Fats Butter or margarine; vegetable oils; unsalted salad dressings, regular salad dressings limited to 1 Tbs; light, sour and heavy cream Regular salad  dressings containing bacon fat, bacon bits, and salt pork; snack dips made with instant soup mixes or processed cheese; salted nuts  Fruits Most fresh, frozen and canned fruits Fruits  processed with salt or sodium-containing ingredient (some dried fruits are processed with sodium sulfites        Vegetables Fresh, frozen vegetables and low- sodium canned vegetables Regular canned vegetables, sauerkraut, pickled vegetables, and others prepared in brine; frozen vegetables in sauces; vegetables seasoned with ham, bacon or salt pork  Condiments, Sauces, Miscellaneous  Salt substitute with physician's approval; pepper, herbs, spices; vinegar, lemon or lime juice; hot pepper sauce; garlic powder, onion powder, low sodium soy sauce (1 Tbs.); low sodium condiments (ketchup, chili sauce, mustard) in limited amounts (1 tsp.) fresh ground horseradish; unsalted tortilla chips, pretzels, potato chips, popcorn, salsa (1/4 cup) Any seasoning made with salt including garlic salt, celery salt, onion salt, and seasoned salt; sea salt, rock salt, kosher salt; meat tenderizers; monosodium glutamate; mustard, regular soy sauce, barbecue, sauce, chili sauce, teriyaki sauce, steak sauce, Worcestershire sauce, and most flavored vinegars; canned gravy and mixes; regular condiments; salted snack foods, olives, picles, relish, horseradish sauce, catsup   Food preparation: Try these seasonings Meats:    Pork Sage, onion Serve with applesauce  Chicken Poultry seasoning, thyme, parsley Serve with cranberry sauce  Lamb Curry powder, rosemary, garlic, thyme Serve with mint sauce or jelly  Veal Marjoram, basil Serve with current jelly, cranberry sauce  Beef Pepper, bay leaf Serve with dry mustard, unsalted chive butter  Fish [place] leaf, dill Serve with unsalted lemon butter, unsalted parsley butter  Vegetables:    Asparagus Lemon juice   Broccoli Lemon juice   Carrots Mustard dressing parsley, mint, nutmeg, glazed with unsalted butter and sugar   Green beans Marjoram, lemon juice, nutmeg,dill seed   Tomatoes Basil, marjoram, onion   Spice /blend for [employer]" 4 tsp ground thyme 1 tsp ground sage 3 tsp  ground rosemary 4 tsp ground marjoram   Test your knowledge A product that says "Salt Free" may still contain sodium. True or False Garlic Powder and Hot Pepper Sauce an be used as alternative seasonings.True or False Processed foods have more sodium than fresh foods.  True or False Canned Vegetables have less sodium than froze True or False   WAYS TO DECREASE YOUR SODIUM INTAKE Avoid the use of added salt in cooking and at the table.  Table salt (and other prepared seasonings which contain salt) is probably one of the greatest sources of sodium in the diet.  Unsalted foods can gain flavor from the sweet, sour, and butter taste sensations of herbs and spices.  Instead of using salt for seasoning, try the following seasonings with the foods listed.  Remember: how you use them to enhance natural food flavors is limited only by your creativity... Allspice-Meat, fish, eggs, fruit, peas, red and yellow vegetables Almond Extract-Fruit baked goods Anise Seed-Sweet breads, fruit, carrots, beets, cottage cheese, cookies (tastes like licorice) Basil-Meat, fish, eggs, vegetables, rice, vegetables salads, soups, sauces Bay Leaf-Meat, fish, stews, poultry Burnet-Salad, vegetables (cucumber-like flavor) Caraway Seed-Bread, cookies, cottage cheese, meat, vegetables, cheese, rice Cardamon-Baked goods, fruit, soups Celery Powder or seed-Salads, salad dressings, sauces, meatloaf, soup, bread.Do not use  celery salt Chervil-Meats, salads, fish, eggs, vegetables, cottage cheese (parsley-like flavor) Chili Power-Meatloaf, chicken cheese, corn, eggplant, egg dishes Chives-Salads cottage cheese, egg dishes, soups, vegetables, sauces Cilantro-Salsa, casseroles Cinnamon-Baked goods, fruit, pork, lamb, chicken, carrots Cloves-Fruit, baked goods, fish, pot roast, green beans, beets, carrots  Coriander-Pastry, cookies, meat, salads, cheese (lemon-orange flavor) Cumin-Meatloaf, fish,cheese, eggs, cabbage,fruit pie  (caraway flavor) [employer], fruit, eggs, fish, poultry, cottage cheese, vegetables Dill Seed-Meat, cottage cheese, poultry, vegetables, fish, salads, bread Fennel Seed-Bread, cookies, apples, pork, eggs, fish, beets, cabbage, cheese, Licorice-like flavor Garlic-(buds or powder) Salads, meat, poultry, fish, bread, butter, vegetables, potatoes.Do not  use garlic salt Ginger-Fruit, vegetables, baked goods, meat, fish, poultry Horseradish Root-Meet, vegetables, butter Lemon Juice or Extract-Vegetables, fruit, tea, baked goods, fish salads Mace-Baked goods fruit, vegetables, fish, poultry (taste like nutmeg) Maple Extract-Syrups Marjoram-Meat, chicken, fish, vegetables, breads, green salads (taste like Sage) Mint-Tea, lamb, sherbet, vegetables, desserts, carrots, cabbage Mustard, Dry or Seed-Cheese, eggs, meats, vegetables, poultry Nutmeg-Baked goods, fruit, chicken, eggs, vegetables, desserts Onion Powder-Meat, fish, poultry, vegetables, cheese, eggs, bread, rice salads (Do not use   Onion salt) Orange Extract-Desserts, baked goods Oregano-Pasta, eggs, cheese, onions, pork, lamb, fish, chicken, vegetables, green salads Paprika-Meat, fish, poultry, eggs, cheese, vegetables Parsley Flakes-Butter, vegetables, meat fish, poultry, eggs, bread, salads (certain forms may   Contain sodium Pepper-Meat fish, poultry, vegetables, eggs Peppermint Extract-Desserts, baked goods Poppy Seed-Eggs, bread, cheese, fruit dressings, baked goods, noodles, vegetables, cottage  [employer], poultry, meat, fish, cauliflower, turnips,eggs bread Saffron-Rice, bread, veal, chicken, fish, eggs Sage-Meat, fish, poultry, onions, eggplant, tomateos, pork, stews Savory-Eggs, salads, poultry, meat, rice, vegetables, soups, pork Tarragon-Meat, poultry, fish, eggs, butter, vegetables (licorice-like flavor)  Thyme-Meat, poultry, fish, eggs, vegetables, (clover-like flavor),  sauces, soups Tumeric-Salads, butter, eggs, fish, rice, vegetables (saffron-like flavor) Vanilla Extract-Baked goods, candy Vinegar-Salads, vegetables, meat marinades Walnut Extract-baked goods, candy   2. Choose your Foods Wisely   The following is a list of foods to avoid which are high in sodium:  Meats-Avoid all smoked, canned, salt cured, dried and kosher meat and fish as well as Anchovies   [employer] meats:Bologna, Liverwurst, Pastrami Canned meat or fish  Marinated herring Caviar    Pepperoni Corned Beef   Pizza Dried chipped beef  Salami Frozen breaded fish or meat Salt pork Frankfurters or hot dogs  Sardines Gefilte fish   Sausage Ham (boiled ham, Proscuitto Smoked butt    spiced ham)   Spam      TV Dinners Vegetables Canned vegetables (Regular) Relish Canned mushrooms  Sauerkraut Olives    Tomato juice Pickles  Bakery and Dessert Products Canned puddings  Cream pies Cheesecake   Decorated cakes Cookies  Beverages/Juices Tomato juice, regular  Gatorade   V-8 vegetable juice, regular  Breads and Cereals Biscuit mixes   Salted potato chips, corn chips, pretzels Bread stuffing mixes  Salted crackers and rolls Pancake and waffle mixes Self-rising flour  Seasonings Accent    Meat sauces Barbecue sauce  Meat tenderizer Catsup    Monosodium glutamate (MSG) Celery salt   Onion salt Chili sauce   Prepared mustard Garlic salt   Salt, seasoned salt, sea salt Gravy mixes   Soy sauce Horseradish   Steak sauce Ketchup   Tartar sauce Lite salt    Teriyaki sauce Marinade mixes   Worcestershire sauce  Others Baking powder   Cocoa and cocoa mixes Baking soda   Commercial casserole mixes Candy-caramels, chocolate  Dehydrated soups    Bars, fudge,nougats  Instant rice and pasta mixes Canned broth or soup  Maraschino cherries Cheese, aged and processed cheese and cheese spreads  Learning Assessment Quiz  Indicated T (for True) or F (for False) for  each of the following statements:  _____ Fresh fruits and vegetables  and unprocessed grains are generally low in sodium _____ Water may contain a considerable amount of sodium, depending on the source _____ You can always tell if a food is high in sodium by tasting it _____ Certain laxatives my be high in sodium and should be avoided unless prescribed   by a physician or pharmacist _____ Salt substitutes may be used freely by anyone on a sodium restricted diet _____ Sodium is present in table salt, food additives and as a natural component of   most foods _____ Table salt is approximately 90% sodium _____ Limiting sodium intake may help prevent excess fluid accumulation in the body _____ On a sodium-restricted diet, seasonings such as bouillon soy sauce, and    cooking wine should be used in place of table salt _____ On an ingredient list, a product which lists monosodium glutamate as the first   ingredient is an appropriate food to include on a low sodium diet  Circle the best answer(s) to the following statements (Hint: there may be more than one correct answer)  11. On a low-sodium diet, some acceptable snack items are:    A. Olives  F. Bean dip   K. Grapefruit juice    B. Salted Pretzels G. Commercial Popcorn   L. Canned peaches    C. Carrot Sticks  H. Bouillon   M. Unsalted nuts   D. [place] fries  I. Peanut butter crackers N. Salami   E. Sweet pickles J. Tomato Juice   O. Pizza  12.  Seasonings that may be used freely on a reduced - sodium diet include   A. Lemon wedges F.Monosodium glutamate K. Celery seed    B.Soysauce   G. Pepper   L. Mustard powder   C. Sea salt  H. Cooking wine  M. Onion flakes   D. Vinegar  E. Prepared horseradish N. Salsa   E. Sage   J. Worcestershire sauce  O. [address]    Sumner Boast, PA-C  04/06/2022 9:27 AM    [facility] [address], [place], [place]  [address] Phone: [phone]; Fax: [phone]

## 2022-04-06 ENCOUNTER — Ambulatory Visit: Payer: Medicare Other | Admitting: Physician Assistant

## 2022-04-06 ENCOUNTER — Encounter: Payer: Self-pay | Admitting: *Deleted

## 2022-04-06 ENCOUNTER — Encounter: Payer: Self-pay | Admitting: Physician Assistant

## 2022-04-06 VITALS — BP 148/90 | HR 50 | Ht 68.0 in | Wt 223.4 lb

## 2022-04-06 DIAGNOSIS — I2510 Atherosclerotic heart disease of native coronary artery without angina pectoris: Secondary | ICD-10-CM | POA: Diagnosis not present

## 2022-04-06 DIAGNOSIS — E782 Mixed hyperlipidemia: Secondary | ICD-10-CM

## 2022-04-06 DIAGNOSIS — R0989 Other specified symptoms and signs involving the circulatory and respiratory systems: Secondary | ICD-10-CM

## 2022-04-06 DIAGNOSIS — Z01818 Encounter for other preprocedural examination: Secondary | ICD-10-CM

## 2022-04-06 DIAGNOSIS — R7303 Prediabetes: Secondary | ICD-10-CM

## 2022-04-06 NOTE — Patient Instructions (Signed)
Medication Instructions:  ?Your physician recommends that you continue on your current medications as directed. Please refer to the Current Medication list given to you today. ? ?*If you need a refill on your cardiac medications before your next appointment, please call your pharmacy* ? ? ?Lab Work: ?NONE  ? ?If you have labs (blood work) drawn today and your tests are completely normal, you will receive your results only by: ?MyChart Message (if you have MyChart) OR ?A paper copy in the mail ?If you have any lab test that is abnormal or we need to change your treatment, we will call you to review the results. ? ? ?Testing/Procedures: ?Your physician has requested that you have en exercise stress myoview. For further information please visit [email or web address]. Please follow instruction sheet, as given.  ? ?Your physician has requested that you have a carotid duplex. This test is an ultrasound of the carotid arteries in your neck. It looks at blood flow through these arteries that supply the brain with blood. Allow one hour for this exam. There are no restrictions or special instructions. ? ? ? ?Follow-Up: ?At [facility], you and your health needs are our priority.  As part of our continuing mission to provide you with exceptional heart care, we have created designated Provider Care Teams.  These Care Teams include your primary Cardiologist (physician) and Advanced Practice Providers (APPs -  Physician Assistants and Nurse Practitioners) who all work together to provide you with the care you need, when you need it. ? ?We recommend signing up for the patient portal called "MyChart".  Sign up information is provided on this After Visit Summary.  MyChart is used to connect with patients for Virtual Visits (Telemedicine).  Patients are able to view lab/test results, encounter notes, upcoming appointments, etc.  Non-urgent messages can be sent to your provider as well.   ?To learn more about what you can do with  MyChart, go to [email or web address].   ? ?Your next appointment:   ?1 year(s) ? ?The format for your next appointment:   ?In Person ? ?Provider:   ?Rozann Lesches, MD  ? ? ?Other Instructions ?Thank you for choosing [facility]! ?  ? ?Please monitor your blood pressure and notify out office if they start to trend higher that 135/85.  ? ?Important Information About Sugar ? ? ? ? ? ?Two Gram Sodium Diet 2000 mg ? ?What is Sodium? Sodium is a mineral found naturally in many foods. The most significant source of sodium in the diet is table salt, which is about 40% sodium.  Processed, convenience, and preserved foods also contain a large amount of sodium.  The body needs only 500 mg of sodium daily to function,  A normal diet provides more than enough sodium even if you do not use salt. ? ?Why Limit Sodium? A build up of sodium in the body can cause thirst, increased blood pressure, shortness of breath, and water retention.  Decreasing sodium in the diet can reduce edema and risk of heart attack or stroke associated with high blood pressure.  Keep in mind that there are many other factors involved in these health problems.  Heredity, obesity, lack of exercise, cigarette smoking, stress and what you eat all play a role. ? ?General Guidelines: ?Do not add salt at the table or in cooking.  One teaspoon of salt contains over 2 grams of sodium. ?Read food labels ?Avoid processed and convenience foods ?Ask your dietitian before eating any foods not dicussed in  the menu planning guidelines ?Consult your physician if you wish to use a salt substitute or a sodium containing medication such as antacids.  Limit milk and milk products to 16 oz (2 cups) per day. ? ?Shopping Hints: ?READ LABELS!! "Dietetic" does not necessarily mean low sodium. ?Salt and other sodium ingredients are often added to foods during processing. ? ? ? ?Menu Planning Guidelines ?Food Group Choose More Often Avoid  ?Beverages (see also the milk  group All fruit juices, low-sodium, salt-free vegetables juices, low-sodium carbonated beverages Regular vegetable or tomato juices, commercially softened water used for drinking or cooking  ?Breads and Cereals Enriched white, wheat, rye and pumpernickel bread, hard rolls and dinner rolls; muffins, cornbread and waffles; most dry cereals, cooked cereal without added salt; unsalted crackers and breadsticks; low sodium or homemade bread crumbs Bread, rolls and crackers with salted tops; quick breads; instant hot cereals; pancakes; commercial bread stuffing; self-rising flower and biscuit mixes; regular bread crumbs or cracker crumbs  ?Desserts and Sweets Desserts and sweets mad with mild should be within allowance Instant pudding mixes and cake mixes  ?Fats Butter or margarine; vegetable oils; unsalted salad dressings, regular salad dressings limited to 1 Tbs; light, sour and heavy cream Regular salad dressings containing bacon fat, bacon bits, and salt pork; snack dips made with instant soup mixes or processed cheese; salted nuts  ?Fruits Most fresh, frozen and canned fruits Fruits processed with salt or sodium-containing ingredient (some dried fruits are processed with sodium sulfites  ? ? ? ? ? ? ?Vegetables Fresh, frozen vegetables and low- sodium canned vegetables Regular canned vegetables, sauerkraut, pickled vegetables, and others prepared in brine; frozen vegetables in sauces; vegetables seasoned with ham, bacon or salt pork  ?Condiments, Sauces, Miscellaneous ? Salt substitute with physician's approval; pepper, herbs, spices; vinegar, lemon or lime juice; hot pepper sauce; garlic powder, onion powder, low sodium soy sauce (1 Tbs.); low sodium condiments (ketchup, chili sauce, mustard) in limited amounts (1 tsp.) fresh ground horseradish; unsalted tortilla chips, pretzels, potato chips, popcorn, salsa (1/4 cup) Any seasoning made with salt including garlic salt, celery salt, onion salt, and seasoned salt; sea  salt, rock salt, kosher salt; meat tenderizers; monosodium glutamate; mustard, regular soy sauce, barbecue, sauce, chili sauce, teriyaki sauce, steak sauce, Worcestershire sauce, and most flavored vinegars; canned gravy and mixes; regular condiments; salted snack foods, olives, picles, relish, horseradish sauce, catsup  ? ?Food preparation: Try these seasonings ?Meats:    ?Pork Sage, onion Serve with applesauce  ?Chicken Poultry seasoning, thyme, parsley Serve with cranberry sauce  ?Lamb Curry powder, rosemary, garlic, thyme Serve with mint sauce or jelly  ?Veal Marjoram, basil Serve with current jelly, cranberry sauce  ?Beef Pepper, bay leaf Serve with dry mustard, unsalted chive butter  ?Fish [place] leaf, dill Serve with unsalted lemon butter, unsalted parsley butter  ?Vegetables:    ?Asparagus Lemon juice   ?Broccoli Lemon juice   ?Carrots Mustard dressing parsley, mint, nutmeg, glazed with unsalted butter and sugar   ?Green beans Marjoram, lemon juice, nutmeg,dill seed   ?Tomatoes Basil, marjoram, onion   ?Spice /blend for "Salt Shaker" 4 tsp ground thyme ?1 tsp ground sage 3 tsp ground rosemary ?4 tsp ground marjoram  ? ?Test your knowledge ?A product that says "Salt Free" may still contain sodium. True or False ?Garlic Powder and Hot Pepper Sauce an be used as alternative seasonings.True or False ?Processed foods have more sodium than fresh foods.  True or False ?Canned Vegetables have less sodium than  froze True or False ? ? ?WAYS TO DECREASE YOUR SODIUM INTAKE ?Avoid the use of added salt in cooking and at the table.  Table salt (and other prepared seasonings which contain salt) is probably one of the greatest sources of sodium in the diet.  Unsalted foods can gain flavor from the sweet, sour, and butter taste sensations of herbs and spices.  Instead of using salt for seasoning, try the following seasonings with the foods listed.  Remember: how you use them to enhance natural food flavors is limited only by  your creativity... ?Allspice-Meat, fish, eggs, fruit, peas, red and yellow vegetables ?Almond Extract-Fruit baked goods ?Anise Seed-Sweet breads, fruit, carrots, beets, cottage cheese, cookies (tastes like l

## 2022-04-08 ENCOUNTER — Telehealth: Payer: Self-pay | Admitting: Cardiology

## 2022-04-08 NOTE — Telephone Encounter (Signed)
Patient was told Friday afternoon that his stress test that was scheduled for Friday was not approved by his insurance. The patient would like more information about why it was denied before he reschedules

## 2022-04-11 ENCOUNTER — Encounter (HOSPITAL_COMMUNITY): Payer: Medicare Other

## 2022-04-20 ENCOUNTER — Encounter (HOSPITAL_COMMUNITY): Payer: Self-pay

## 2022-04-20 ENCOUNTER — Encounter (HOSPITAL_COMMUNITY): Payer: Medicare Other

## 2022-04-20 ENCOUNTER — Ambulatory Visit (HOSPITAL_COMMUNITY)
Admission: RE | Admit: 2022-04-20 | Discharge: 2022-04-20 | Disposition: A | Discharge status: Home / Self Care | Payer: Medicare Other | Source: Ambulatory Visit | Attending: Physician Assistant | Admitting: Physician Assistant

## 2022-04-20 ENCOUNTER — Encounter (HOSPITAL_BASED_OUTPATIENT_CLINIC_OR_DEPARTMENT_OTHER)
Admission: RE | Admit: 2022-04-20 | Discharge: 2022-04-20 | Disposition: A | Discharge status: Home / Self Care | Payer: Medicare Other | Source: Ambulatory Visit | Attending: Physician Assistant | Admitting: Physician Assistant

## 2022-04-20 DIAGNOSIS — I2510 Atherosclerotic heart disease of native coronary artery without angina pectoris: Secondary | ICD-10-CM

## 2022-04-20 LAB — NM MYOCAR MULTI W/SPECT W/WALL MOTION / EF
LV dias vol: 77 mL (ref 62–150)
LV sys vol: 25 mL
Nuc Stress EF: 68 %
Peak HR: 106 {beats}/min
RATE: 0.3
Rest HR: 70 {beats}/min
Rest Nuclear Isotope Dose: 11 mCi
SDS: 0
SRS: 0
SSS: 0
ST Depression (mm): 0 mm
Stress Nuclear Isotope Dose: 32.5 mCi
TID: 1.07

## 2022-04-20 MED ORDER — REGADENOSON 0.4 MG/5ML IV SOLN
INTRAVENOUS | Status: AC
  Administered 2022-04-20: 0.4 mg via INTRAVENOUS
  Filled 2022-04-20: qty 5

## 2022-04-20 MED ORDER — TECHNETIUM TC 99M TETROFOSMIN IV KIT
10.0000 | PACK | Freq: Once | INTRAVENOUS | Status: AC | PRN
  Administered 2022-04-20: 11 via INTRAVENOUS

## 2022-04-20 MED ORDER — SODIUM CHLORIDE FLUSH 0.9 % IV SOLN
INTRAVENOUS | Status: AC
  Administered 2022-04-20: 10 mL via INTRAVENOUS
  Filled 2022-04-20: qty 10

## 2022-04-20 MED ORDER — TECHNETIUM TC 99M TETROFOSMIN IV KIT
30.0000 | PACK | Freq: Once | INTRAVENOUS | Status: AC | PRN
  Administered 2022-04-20: 32.5 via INTRAVENOUS

## 2022-04-21 ENCOUNTER — Encounter (HOSPITAL_COMMUNITY): Payer: Medicare Other

## 2022-04-25 ENCOUNTER — Ambulatory Visit (HOSPITAL_COMMUNITY)
Admission: RE | Admit: 2022-04-25 | Discharge: 2022-04-25 | Disposition: A | Discharge status: Home / Self Care | Payer: Medicare Other | Source: Ambulatory Visit | Attending: Physician Assistant | Admitting: Physician Assistant

## 2022-04-25 DIAGNOSIS — Z01818 Encounter for other preprocedural examination: Secondary | ICD-10-CM | POA: Insufficient documentation

## 2022-04-25 DIAGNOSIS — R0989 Other specified symptoms and signs involving the circulatory and respiratory systems: Secondary | ICD-10-CM | POA: Diagnosis present

## 2022-05-23 NOTE — Progress Notes (Signed)
COVID Vaccine Completed: yes x2 Date of COVID positive in last 90 days:  PCP - Catalina Antigua, MD Cardiologist - Rozann Lesches, MD  Cardiac clearance 04/06/22 by Ermalinda Barrios in Epic   Chest x-ray -  EKG - 04/06/22 Epic Stress Test - 04/20/22 Epic ECHO - greater than 2 years Cardiac Cath -  Pacemaker/ICD device last checked: Spinal Cord Stimulator:  Bowel Prep -   Sleep Study -  CPAP -   Fasting Blood Sugar - pre DM Checks Blood Sugar _____ times a day  Blood Thinner Instructions: Aspirin Instructions: ASA 81 Last Dose:  Activity level:  Can go up a flight of stairs and perform activities of daily living without stopping and without symptoms of chest pain or shortness of breath.  Able to exercise without symptoms  Unable to go up a flight of stairs without symptoms of     Anesthesia review: CAD, HTN, NSTEMI, CABG  Patient denies shortness of breath, fever, cough and chest pain at PAT appointment  Patient verbalized understanding of instructions that were given to them at the PAT appointment. Patient was also instructed that they will need to review over the PAT instructions again at home before surgery.

## 2022-05-23 NOTE — Patient Instructions (Signed)
DUE TO COVID-19 ONLY TWO VISITORS  (aged 72 and older)  ARE ALLOWED TO COME WITH YOU AND STAY IN THE WAITING ROOM ONLY DURING PRE OP AND PROCEDURE.   **NO VISITORS ARE ALLOWED IN THE SHORT STAY AREA OR RECOVERY ROOM!!**    Your procedure is scheduled on: 06/06/22   Report to [facility] Main Entrance    Report to admitting at 5:15 AM   Call this number if you have problems the morning of surgery [phone]   Do not eat food :After Midnight.   After Midnight you may have the following liquids until 4:30 AM DAY OF SURGERY  Water Black Coffee (sugar ok, NO MILK/CREAM OR CREAMERS)  Tea (sugar ok, NO MILK/CREAM OR CREAMERS) regular and decaf                             Plain Jell-O (NO RED)                                           Fruit ices (not with fruit pulp, NO RED)                                     Popsicles (NO RED)                                                                  Juice: apple, WHITE grape, WHITE cranberry Sports drinks like Gatorade (NO RED) Clear broth(vegetable,chicken,beef)    The day of surgery:  Drink ONE (1) Pre-Surgery G2 at 4:30 AM the morning of surgery. Drink in one sitting. Do not sip.  This drink was given to you during your hospital  pre-op appointment visit. Nothing else to drink after completing the  Pre-Surgery G2.          If you have questions, please contact your surgeon's office.   FOLLOW BOWEL PREP AND ANY ADDITIONAL PRE OP INSTRUCTIONS YOU RECEIVED FROM YOUR SURGEON'S OFFICE!!!     Oral Hygiene is also important to reduce your risk of infection.                                    Remember - BRUSH YOUR TEETH THE MORNING OF SURGERY WITH YOUR REGULAR TOOTHPASTE   Take these medicines the morning of surgery with A SIP OF WATER: Atorvastatin, Isosorbide, Metoprolol   DO NOT TAKE ANY ORAL DIABETIC MEDICATIONS DAY OF YOUR SURGERY  How to Manage Your Diabetes Before and After Surgery  Why is it important to control my blood  sugar before and after surgery? Improving blood sugar levels before and after surgery helps healing and can limit problems. A way of improving blood sugar control is eating a healthy diet by:  Eating less sugar and carbohydrates  Increasing activity/exercise  Talking with your doctor about reaching your blood sugar goals High blood sugars (greater than 180 mg/dL) can raise your risk of infections and slow your recovery, so you  will need to focus on controlling your diabetes during the weeks before surgery. Make sure that the doctor who takes care of your diabetes knows about your planned surgery including the date and location.  How do I manage my blood sugar before surgery? Check your blood sugar at least 4 times a day, starting 2 days before surgery, to make sure that the level is not too high or low. Check your blood sugar the morning of your surgery when you wake up and every 2 hours until you get to the Short Stay unit. If your blood sugar is less than 70 mg/dL, you will need to treat for low blood sugar: Do not take insulin. Treat a low blood sugar (less than 70 mg/dL) with  cup of clear juice (cranberry or apple), 4 glucose tablets, OR glucose gel. Recheck blood sugar in 15 minutes after treatment (to make sure it is greater than 70 mg/dL). If your blood sugar is not greater than 70 mg/dL on recheck, call [phone] for further instructions. Report your blood sugar to the short stay nurse when you get to Short Stay.  If you are admitted to the hospital after surgery: Your blood sugar will be checked by the staff and you will probably be given insulin after surgery (instead of oral diabetes medicines) to make sure you have good blood sugar levels. The goal for blood sugar control after surgery is 80-180 mg/dL.  Reviewed and Endorsed by [facility] Patient Education Committee, August 2015                               You may not have any metal on your body including jewelry, and body  piercing             Do not wear lotions, powders, cologne, or deodorant              Men may shave face and neck.   Do not bring valuables to the hospital. [place].  DO NOT [place]. PHARMACY WILL DISPENSE MEDICATIONS LISTED ON YOUR MEDICATION LIST TO YOU DURING YOUR ADMISSION [place]!    Patients discharged on the day of surgery will not be allowed to drive home.  Someone NEEDS to stay with you for the first 24 hours after anesthesia.   Special Instructions: Bring a copy of your healthcare power of attorney and living will documents         the day of surgery if you haven't scanned them before.              Please read over the following fact sheets you were given: IF YOU HAVE QUESTIONS ABOUT YOUR PRE-OP INSTRUCTIONS PLEASE CALL [place] - Preparing for Surgery Before surgery, you can play an important role.  Because skin is not sterile, your skin needs to be as free of germs as possible.  You can reduce the number of germs on your skin by washing with CHG (chlorahexidine gluconate) soap before surgery.  CHG is an antiseptic cleaner which kills germs and bonds with the skin to continue killing germs even after washing. Please DO NOT use if you have an allergy to CHG or antibacterial soaps.  If your skin becomes reddened/irritated stop using the CHG and inform  your nurse when you arrive at Short Stay. Do not shave (including legs and underarms) for at least 48 hours prior to the first CHG shower.  You may shave your face/neck.  Please follow these instructions carefully:  1.  Shower with CHG Soap the night before surgery and the  morning of surgery.  2.  If you choose to wash your hair, wash your hair first as usual with your normal  shampoo.  3.  After you shampoo, rinse your hair and body thoroughly to remove the shampoo.                             4.  Use CHG as  you would any other liquid soap.  You can apply chg directly to the skin and wash.  Gently with a scrungie or clean washcloth.  5.  Apply the CHG Soap to your body ONLY FROM THE NECK DOWN.   Do   not use on face/ open                           Wound or open sores. Avoid contact with eyes, ears mouth and   genitals (private parts).                       Wash face,  Genitals (private parts) with your normal soap.             6.  Wash thoroughly, paying special attention to the area where your    surgery  will be performed.  7.  Thoroughly rinse your body with warm water from the neck down.  8.  DO NOT shower/wash with your normal soap after using and rinsing off the CHG Soap.                9.  Pat yourself dry with a clean towel.            10.  Wear clean pajamas.            11.  Place clean sheets on your bed the night of your first shower and do not  sleep with pets. Day of Surgery : Do not apply any lotions/deodorants the morning of surgery.  Please wear clean clothes to the hospital/surgery center.  FAILURE TO FOLLOW THESE INSTRUCTIONS MAY RESULT IN THE CANCELLATION OF YOUR SURGERY  PATIENT SIGNATURE_________________________________  NURSE SIGNATURE__________________________________  ________________________________________________________________________   Adam Phenix  An incentive spirometer is a tool that can help keep your lungs clear and active. This tool measures how well you are filling your lungs with each breath. Taking long deep breaths may help reverse or decrease the chance of developing breathing (pulmonary) problems (especially infection) following: A long period of time when you are unable to move or be active. BEFORE THE PROCEDURE  If the spirometer includes an indicator to show your best effort, your nurse or respiratory therapist will set it to a desired goal. If possible, sit up straight or lean slightly forward. Try not to slouch. Hold the incentive  spirometer in an upright position. INSTRUCTIONS FOR USE  Sit on the edge of your bed if possible, or sit up as far as you can in bed or on a chair. Hold the incentive spirometer in an upright position. Breathe out normally. Place the mouthpiece in your mouth and seal your lips tightly around it. Breathe in slowly and  as deeply as possible, raising the piston or the ball toward the top of the column. Hold your breath for 3-5 seconds or for as long as possible. Allow the piston or ball to fall to the bottom of the column. Remove the mouthpiece from your mouth and breathe out normally. Rest for a few seconds and repeat Steps 1 through 7 at least 10 times every 1-2 hours when you are awake. Take your time and take a few normal breaths between deep breaths. The spirometer may include an indicator to show your best effort. Use the indicator as a goal to work toward during each repetition. After each set of 10 deep breaths, practice coughing to be sure your lungs are clear. If you have an incision (the cut made at the time of surgery), support your incision when coughing by placing a pillow or rolled up towels firmly against it. Once you are able to get out of bed, walk around indoors and cough well. You may stop using the incentive spirometer when instructed by your caregiver.  RISKS AND COMPLICATIONS Take your time so you do not get dizzy or light-headed. If you are in pain, you may need to take or ask for pain medication before doing incentive spirometry. It is harder to take a deep breath if you are having pain. AFTER USE Rest and breathe slowly and easily. It can be helpful to keep track of a log of your progress. Your caregiver can provide you with a simple table to help with this. If you are using the spirometer at home, follow these instructions: [place] IF:  You are having difficultly using the spirometer. You have trouble using the spirometer as often as instructed. Your pain  medication is not giving enough relief while using the spirometer. You develop fever of 100.5 F (38.1 C) or higher. SEEK IMMEDIATE MEDICAL CARE IF:  You cough up bloody sputum that had not been present before. You develop fever of 102 F (38.9 C) or greater. You develop worsening pain at or near the incision site. MAKE SURE YOU:  Understand these instructions. Will watch your condition. Will get help right away if you are not doing well or get worse. Document Released: 03/20/2007 Document Revised: 01/30/2012 Document Reviewed: 05/21/2007 [facility] Patient Information 2014 [place], [place].   ________________________________________________________________________

## 2022-05-25 ENCOUNTER — Encounter (HOSPITAL_COMMUNITY): Payer: Self-pay

## 2022-05-25 ENCOUNTER — Encounter (HOSPITAL_COMMUNITY)
Admission: RE | Admit: 2022-05-25 | Discharge: 2022-05-25 | Disposition: A | Discharge status: Home / Self Care | Payer: Medicare Other | Source: Ambulatory Visit | Attending: Orthopedic Surgery | Admitting: Orthopedic Surgery

## 2022-05-25 VITALS — BP 135/77 | HR 60 | Temp 98.1°F | Resp 14 | Ht 68.0 in | Wt 216.6 lb

## 2022-05-25 DIAGNOSIS — Z01812 Encounter for preprocedural laboratory examination: Secondary | ICD-10-CM | POA: Insufficient documentation

## 2022-05-25 DIAGNOSIS — Z01818 Encounter for other preprocedural examination: Secondary | ICD-10-CM

## 2022-05-25 DIAGNOSIS — R7303 Prediabetes: Secondary | ICD-10-CM | POA: Insufficient documentation

## 2022-05-25 DIAGNOSIS — I10 Essential (primary) hypertension: Secondary | ICD-10-CM | POA: Insufficient documentation

## 2022-05-25 HISTORY — DX: Prediabetes: R73.03

## 2022-05-25 LAB — CBC
HCT: 41 % (ref 39.0–52.0)
Hemoglobin: 13.4 g/dL (ref 13.0–17.0)
MCH: 30.3 pg (ref 26.0–34.0)
MCHC: 32.7 g/dL (ref 30.0–36.0)
MCV: 92.8 fL (ref 80.0–100.0)
Platelets: 179 10*3/uL (ref 150–400)
RBC: 4.42 MIL/uL (ref 4.22–5.81)
RDW: 12.5 % (ref 11.5–15.5)
WBC: 6.5 10*3/uL (ref 4.0–10.5)
nRBC: 0 % (ref 0.0–0.2)

## 2022-05-25 LAB — BASIC METABOLIC PANEL
Anion gap: 6 (ref 5–15)
BUN: 33 mg/dL — ABNORMAL HIGH (ref 8–23)
CO2: 24 mmol/L (ref 22–32)
Calcium: 9.1 mg/dL (ref 8.9–10.3)
Chloride: 111 mmol/L (ref 98–111)
Creatinine, Ser: 1.44 mg/dL — ABNORMAL HIGH (ref 0.61–1.24)
GFR, Estimated: 52 mL/min — ABNORMAL LOW (ref >60)
Glucose, Bld: 86 mg/dL (ref 70–99)
Potassium: 4.7 mmol/L (ref 3.5–5.1)
Sodium: 141 mmol/L (ref 135–145)

## 2022-05-25 LAB — SURGICAL PCR SCREEN
MRSA, PCR: NEGATIVE
Staphylococcus aureus: NEGATIVE

## 2022-05-25 LAB — HEMOGLOBIN A1C
Hgb A1c MFr Bld: 6.1 % — ABNORMAL HIGH (ref 4.8–5.6)
Mean Plasma Glucose: 128.37 mg/dL

## 2022-05-25 LAB — GLUCOSE, CAPILLARY: Glucose-Capillary: 95 mg/dL (ref 70–99)

## 2022-05-27 NOTE — Anesthesia Preprocedure Evaluation (Addendum)
Anesthesia Evaluation  Patient identified by MRN, date of birth, ID band Patient awake    Reviewed: Allergy & Precautions, H&P , NPO status , Patient's Chart, lab work & pertinent test results  Airway Mallampati: III  TM Distance: >3 FB Neck ROM: Full    Dental no notable dental hx. (+) Teeth Intact, Dental Advisory Given   Pulmonary neg pulmonary ROS, former smoker,    Pulmonary exam normal breath sounds clear to auscultation       Cardiovascular Exercise Tolerance: Good hypertension, Pt. on medications + CAD, + Past MI and + CABG  negative cardio ROS Normal cardiovascular exam Rhythm:Regular Rate:Normal  EKG: 04/06/2022 Rate 50 bpm  Sinus bradycardia with 1st degree AV block   Myocardial Perfusion 04/20/2022 The study is normal. There are no perfusion defects. The study is low risk. . No ST deviation was noted. . LV perfusion is normal. . Left ventricular function is normal. Nuclear stress EF: 68 %. The left ventricular ejection fraction is hyperdynamic (>65%). End diastolic cavity size is normal.  Echo 09/24/2014 - Left ventricle: The cavity size was normal. Wall thickness was  normal. Systolic function was normal. The estimated ejection  fraction was in the range of 60% to 65%. Wall motion was normal;  there were no regional wall motion abnormalities. Doppler  parameters are consistent with abnormal left ventricular  relaxation (grade 1 diastolic dysfunction).  - Aortic valve: Mildly calcified annulus. Probably trileaflet.  There was no significant regurgitation.  - Mitral valve: Calcified annulus. Mildly thickened leaflets .  There was trivial regurgitation.  - Left atrium: The atrium was mildly dilated.  - Right atrium: The atrium was mildly dilated.  - Tricuspid valve: There was trivial regurgitation. Peak RV-RA  gradient (S): 30 mm Hg.  - Inferior vena cava: Not visualized. Unable to  estimate CVP.  - Pericardium, extracardiac: There was no pericardial effusion.   Neuro/Psych negative neurological ROS  negative psych ROS   GI/Hepatic negative GI ROS, Neg liver ROS,   Endo/Other  negative endocrine ROS  Renal/GU negative Renal ROS  negative genitourinary   Musculoskeletal negative musculoskeletal ROS (+) Arthritis , Osteoarthritis,    Abdominal   Peds negative pediatric ROS (+)  Hematology negative hematology ROS (+)   Anesthesia Other Findings   Reproductive/Obstetrics negative OB ROS                           Anesthesia Physical Anesthesia Plan  ASA: 3  Anesthesia Plan: Spinal, MAC and Regional   Post-op Pain Management: Regional block*, Tylenol PO (pre-op)* and Celebrex PO (pre-op)*   Induction: Intravenous  PONV Risk Score and Plan: 2  Airway Management Planned: Natural Airway and Nasal Cannula  Additional Equipment: None  Intra-op Plan:   Post-operative Plan:   Informed Consent: I have reviewed the patients History and Physical, chart, labs and discussed the procedure including the risks, benefits and alternatives for the proposed anesthesia with the patient or authorized representative who has indicated his/her understanding and acceptance.       Plan Discussed with: Anesthesiologist and CRNA  Anesthesia Plan Comments: (See PAT note 05/25/2022 DISCUSSION:71 y.o. former smoker with h/o HTN, CAD (S/P CABG 10/2010), prostate cancer, right knee OA scheduled for above procedure 06/06/2022 with Dr. Charlies Constable.  Pt last seen by cardiology 04/06/2022. Per OV note, "Preop clearance for Rt total knee replacement by Dr. Charlies Constable. Patient with history of CAD-11 yr bypass grafts, ischemia on stress test  in 2018 although low risk and treated medically, prediabetes, elevated BP today.METs are >7 . Will do exercise NST and convert to lexi if needed. Addendum: NST low risk, no ischemia, can proceed with the  above surgery without any further testing. According to the Revised Cardiac Risk Index (RCRI),hisPerioperative Risk of Major Cardiac Event is (%): 0.9 HisFunctional Capacity in METs is: 7.34according to the [employer] Activity Status Index (DASI)." )       Anesthesia Quick Evaluation

## 2022-05-27 NOTE — Progress Notes (Signed)
Anesthesia Chart Review   Case: [id number] Date/Time: 06/06/22 0715   Procedure: TOTAL KNEE ARTHROPLASTY (Right: Knee)   Anesthesia type: Spinal   Pre-op diagnosis: OA RIGHT KNEE   Location: [facility]OM 08 / [facility]   Surgeons: Willaim Sheng, MD       DISCUSSION:71 y.o. former smoker with h/o HTN, CAD (S/P CABG 10/2010), prostate cancer, right knee OA scheduled for above procedure 06/06/2022 with Dr. Charlies Constable.   Pt last seen by cardiology 04/06/2022. Per OV note, "Preop clearance for Rt total knee replacement by Dr. Charlies Constable. Patient with history of CAD-11 yr bypass grafts, ischemia on stress test in 2018 although low risk and treated medically, prediabetes, elevated BP today.METs are >7 . Will do exercise NST and convert to lexi if needed.    Addendum: NST low risk, no ischemia, can proceed with the above surgery without any further testing.   According to the Revised Cardiac Risk Index (RCRI), his Perioperative Risk of Major Cardiac Event is (%): 0.9   His Functional Capacity in METs is: 7.34 according to the [employer] Activity Status Index (DASI)."  Anticipate pt can proceed with planned procedure barring acute status change.   VS: BP 135/77   Pulse 60   Temp 36.7 C (Oral)   Resp 14   Ht '5\' 8"'$  (1.727 m)   Wt 98.2 kg   SpO2 95%   BMI 32.93 kg/m   PROVIDERS: Leeanne Rio, MD is PCP   Cardiologist - Rozann Lesches, MD LABS: Labs reviewed: Acceptable for surgery. (all labs ordered are listed, but only abnormal results are displayed)  Labs Reviewed  BASIC METABOLIC PANEL - Abnormal; Notable for the following components:      Result Value   BUN 33 (*)    Creatinine, Ser 1.44 (*)    GFR, Estimated 52 (*)    All other components within normal limits  HEMOGLOBIN A1C - Abnormal; Notable for the following components:   Hgb A1c MFr Bld 6.1 (*)    All other components within normal limits  SURGICAL PCR SCREEN  CBC  GLUCOSE, CAPILLARY      IMAGES:   EKG: 04/06/2022 Rate 50 bpm  Sinus bradycardia with 1st degree AV block   CV: Myocardial Perfusion 04/20/2022   The study is normal. There are no perfusion defects.  The study is low risk.   No ST deviation was noted.   LV perfusion is normal.   Left ventricular function is normal. Nuclear stress EF: 68 %. The left ventricular ejection fraction is hyperdynamic (>65%). End diastolic cavity size is normal.   Echo 09/24/2014 - Left ventricle: The cavity size was normal. Wall thickness was    normal. Systolic function was normal. The estimated ejection    fraction was in the range of 60% to 65%. Wall motion was normal;    there were no regional wall motion abnormalities. Doppler    parameters are consistent with abnormal left ventricular    relaxation (grade 1 diastolic dysfunction).  - Aortic valve: Mildly calcified annulus. Probably trileaflet.    There was no significant regurgitation.  - Mitral valve: Calcified annulus. Mildly thickened leaflets .    There was trivial regurgitation.  - Left atrium: The atrium was mildly dilated.  - Right atrium: The atrium was mildly dilated.  - Tricuspid valve: There was trivial regurgitation. Peak RV-RA    gradient (S): 30 mm Hg.  - Inferior vena cava: Not visualized. Unable to estimate CVP.  - Pericardium,  extracardiac: There was no pericardial effusion. Past Medical History:  Diagnosis Date   Arthritis    CAD (coronary artery disease)    Multivessel status post CABG December 2011 - Dr. [name]   Essential hypertension    History of kidney stones    Hyperlipidemia    Neck pain    NSTEMI (non-ST elevated myocardial infarction) ([place]) 10/21/2010   Pre-diabetes    Prostate cancer [facility])    Radical prostatectomy    Past Surgical History:  Procedure Laterality Date   COLONOSCOPY N/A 07/31/2019   Procedure: COLONOSCOPY;  Surgeon: Rogene Houston, MD;  [facility];  Service: Endoscopy;  Laterality: N/A;  27    CORONARY ARTERY BYPASS GRAFT  December 2011   LIMA to LAD, L Radial to OM2, SVG to DX, SVG to PD   CYSTOSCOPY WITH LITHOLAPAXY N/A 10/01/2019   Procedure: CYSTOSCOPY WITH LITHOLAPAXY, HOLMIUM LASER OF BLADDER STONE;  Surgeon: Irine Seal, MD;  [facility];  Service: Urology;  Laterality: N/A;   KNEE ARTHROSCOPY Left 12/18/2013   Procedure: LEFT ARTHROSCOPY KNEE WITH DEBRIDMENT medial and lateral menisectomy condroplasty;  Surgeon: Gearlean Alf, MD;  [facility];  Service: Orthopedics;  Laterality: Left;   LITHOTRIPSY     X 2   PARTIAL KNEE ARTHROPLASTY Left 06/01/2015   Procedure: LEFT KNEE MEDIAL UNICOMPARTMENTAL ARTHROPLASTY;  Surgeon: Gaynelle Arabian, MD;  [facility];  Service: Orthopedics;  Laterality: Left;   POLYPECTOMY  07/31/2019   Procedure: POLYPECTOMY;  Surgeon: Rogene Houston, MD;  [facility];  Service: Endoscopy;;  cecum,ascending,transverse,splenic flexure   PROSTATECTOMY  2008   TONSILLECTOMY      MEDICATIONS:  aspirin 81 MG tablet   atorvastatin (LIPITOR) 40 MG tablet   celecoxib (CELEBREX) 200 MG capsule   isosorbide mononitrate (IMDUR) 30 MG 24 hr tablet   lisinopril (ZESTRIL) 5 MG tablet   metoprolol tartrate (LOPRESSOR) 25 MG tablet   Omega-3 Fatty Acids (FISH OIL) 1200 MG CPDR   No current facility-administered medications for this encounter.     Konrad Felix Ward, PA-C [facility] [phone]

## 2022-06-03 ENCOUNTER — Other Ambulatory Visit: Payer: Self-pay | Admitting: Cardiology

## 2022-06-04 ENCOUNTER — Other Ambulatory Visit: Payer: Self-pay | Admitting: Cardiology

## 2022-06-06 ENCOUNTER — Ambulatory Visit (HOSPITAL_COMMUNITY): Payer: Medicare Other

## 2022-06-06 ENCOUNTER — Ambulatory Visit (HOSPITAL_COMMUNITY)
Admission: RE | Admit: 2022-06-06 | Discharge: 2022-06-06 | Disposition: A | Discharge status: Home / Self Care | Payer: Medicare Other | Source: Ambulatory Visit | Attending: Orthopedic Surgery | Admitting: Orthopedic Surgery

## 2022-06-06 ENCOUNTER — Other Ambulatory Visit: Payer: Self-pay

## 2022-06-06 ENCOUNTER — Ambulatory Visit (HOSPITAL_COMMUNITY): Payer: Medicare Other | Admitting: Physician Assistant

## 2022-06-06 ENCOUNTER — Encounter (HOSPITAL_COMMUNITY)
Admission: RE | Disposition: A | Discharge status: Home / Self Care | Payer: Self-pay | Source: Ambulatory Visit | Attending: Orthopedic Surgery

## 2022-06-06 ENCOUNTER — Encounter (HOSPITAL_COMMUNITY): Payer: Self-pay | Admitting: Orthopedic Surgery

## 2022-06-06 ENCOUNTER — Ambulatory Visit (HOSPITAL_BASED_OUTPATIENT_CLINIC_OR_DEPARTMENT_OTHER): Payer: Medicare Other | Admitting: Anesthesiology

## 2022-06-06 DIAGNOSIS — I252 Old myocardial infarction: Secondary | ICD-10-CM | POA: Insufficient documentation

## 2022-06-06 DIAGNOSIS — Z951 Presence of aortocoronary bypass graft: Secondary | ICD-10-CM | POA: Insufficient documentation

## 2022-06-06 DIAGNOSIS — I2510 Atherosclerotic heart disease of native coronary artery without angina pectoris: Secondary | ICD-10-CM | POA: Diagnosis not present

## 2022-06-06 DIAGNOSIS — M1711 Unilateral primary osteoarthritis, right knee: Secondary | ICD-10-CM | POA: Diagnosis not present

## 2022-06-06 DIAGNOSIS — M170 Bilateral primary osteoarthritis of knee: Secondary | ICD-10-CM | POA: Insufficient documentation

## 2022-06-06 DIAGNOSIS — Z79899 Other long term (current) drug therapy: Secondary | ICD-10-CM | POA: Diagnosis not present

## 2022-06-06 DIAGNOSIS — Z87891 Personal history of nicotine dependence: Secondary | ICD-10-CM | POA: Diagnosis not present

## 2022-06-06 DIAGNOSIS — I10 Essential (primary) hypertension: Secondary | ICD-10-CM | POA: Diagnosis not present

## 2022-06-06 DIAGNOSIS — R7303 Prediabetes: Secondary | ICD-10-CM

## 2022-06-06 HISTORY — PX: TOTAL KNEE ARTHROPLASTY: SHX125

## 2022-06-06 LAB — BASIC METABOLIC PANEL
Anion gap: 5 (ref 5–15)
BUN: 20 mg/dL (ref 8–23)
CO2: 26 mmol/L (ref 22–32)
Calcium: 8.2 mg/dL — ABNORMAL LOW (ref 8.9–10.3)
Chloride: 106 mmol/L (ref 98–111)
Creatinine, Ser: 1.21 mg/dL (ref 0.61–1.24)
GFR, Estimated: >60 mL/min (ref >60)
Glucose, Bld: 125 mg/dL — ABNORMAL HIGH (ref 70–99)
Potassium: 4 mmol/L (ref 3.5–5.1)
Sodium: 137 mmol/L (ref 135–145)

## 2022-06-06 LAB — GLUCOSE, CAPILLARY: Glucose-Capillary: 116 mg/dL — ABNORMAL HIGH (ref 70–99)

## 2022-06-06 SURGERY — ARTHROPLASTY, KNEE, TOTAL
Anesthesia: Monitor Anesthesia Care | Site: Knee | Laterality: Right

## 2022-06-06 MED ORDER — CEFAZOLIN SODIUM-DEXTROSE 2-4 GM/100ML-% IV SOLN
INTRAVENOUS | Status: AC
  Filled 2022-06-06: qty 100

## 2022-06-06 MED ORDER — ONDANSETRON HCL 4 MG/2ML IJ SOLN
INTRAMUSCULAR | Status: AC
  Filled 2022-06-06: qty 2

## 2022-06-06 MED ORDER — ACETAMINOPHEN 500 MG PO TABS: 1000.0000 mg | ORAL_TABLET | Freq: Three times a day (TID) | ORAL | 0 refills | Status: AC | PRN

## 2022-06-06 MED ORDER — DEXAMETHASONE SODIUM PHOSPHATE 10 MG/ML IJ SOLN
INTRAMUSCULAR | Status: DC | PRN
  Administered 2022-06-06: 10 mg

## 2022-06-06 MED ORDER — ISOPROPYL ALCOHOL 70 % SOLN
Status: DC | PRN
  Administered 2022-06-06: 1 via TOPICAL

## 2022-06-06 MED ORDER — LACTATED RINGERS IV BOLUS
250.0000 mL | Freq: Once | INTRAVENOUS | Status: AC
  Administered 2022-06-06: 250 mL via INTRAVENOUS

## 2022-06-06 MED ORDER — ORAL CARE MOUTH RINSE: 15.0000 mL | Freq: Once | OROMUCOSAL | Status: AC

## 2022-06-06 MED ORDER — PHENYLEPHRINE 80 MCG/ML (10ML) SYRINGE FOR IV PUSH (FOR BLOOD PRESSURE SUPPORT)
PREFILLED_SYRINGE | INTRAVENOUS | Status: DC | PRN
  Administered 2022-06-06 (×4): 80 ug via INTRAVENOUS
  Administered 2022-06-06: 160 ug via INTRAVENOUS

## 2022-06-06 MED ORDER — LACTATED RINGERS IV BOLUS
500.0000 mL | Freq: Once | INTRAVENOUS | Status: AC
  Administered 2022-06-06: 500 mL via INTRAVENOUS

## 2022-06-06 MED ORDER — PHENYLEPHRINE 80 MCG/ML (10ML) SYRINGE FOR IV PUSH (FOR BLOOD PRESSURE SUPPORT)
PREFILLED_SYRINGE | INTRAVENOUS | Status: AC
  Filled 2022-06-06: qty 10

## 2022-06-06 MED ORDER — ONDANSETRON HCL 4 MG PO TABS: 4.0000 mg | ORAL_TABLET | Freq: Three times a day (TID) | ORAL | 0 refills | Status: AC | PRN

## 2022-06-06 MED ORDER — BUPIVACAINE LIPOSOME 1.3 % IJ SUSP: 20.0000 mL | Freq: Once | INTRAMUSCULAR | Status: DC

## 2022-06-06 MED ORDER — EPHEDRINE 5 MG/ML INJ
INTRAVENOUS | Status: AC
  Filled 2022-06-06: qty 5

## 2022-06-06 MED ORDER — SODIUM CHLORIDE 0.9% FLUSH
INTRAVENOUS | Status: DC | PRN
  Administered 2022-06-06: 60 mL

## 2022-06-06 MED ORDER — LACTATED RINGERS IV SOLN
INTRAVENOUS | Status: DC
  Administered 2022-06-06: 1000 mL via INTRAVENOUS

## 2022-06-06 MED ORDER — FENTANYL CITRATE (PF) 100 MCG/2ML IJ SOLN
INTRAMUSCULAR | Status: DC | PRN
Start: 2022-06-06 — End: 2022-06-06
  Administered 2022-06-06 (×2): 50 ug via INTRAVENOUS

## 2022-06-06 MED ORDER — EPHEDRINE SULFATE-NACL 50-0.9 MG/10ML-% IV SOSY
PREFILLED_SYRINGE | INTRAVENOUS | Status: DC | PRN
  Administered 2022-06-06 (×3): 5 mg via INTRAVENOUS
  Administered 2022-06-06: 10 mg via INTRAVENOUS

## 2022-06-06 MED ORDER — PROPOFOL 10 MG/ML IV BOLUS
INTRAVENOUS | Status: DC | PRN
  Administered 2022-06-06: 30 mg via INTRAVENOUS

## 2022-06-06 MED ORDER — BUPIVACAINE LIPOSOME 1.3 % IJ SUSP
INTRAMUSCULAR | Status: AC
  Filled 2022-06-06: qty 20

## 2022-06-06 MED ORDER — CEFAZOLIN SODIUM-DEXTROSE 2-4 GM/100ML-% IV SOLN
2.0000 g | Freq: Four times a day (QID) | INTRAVENOUS | Status: DC
  Administered 2022-06-06: 2 g via INTRAVENOUS

## 2022-06-06 MED ORDER — MIDAZOLAM HCL 2 MG/2ML IJ SOLN
INTRAMUSCULAR | Status: AC
Start: 2022-06-06 — End: ?
  Filled 2022-06-06: qty 2

## 2022-06-06 MED ORDER — SODIUM CHLORIDE (PF) 0.9 % IJ SOLN
INTRAMUSCULAR | Status: AC
  Filled 2022-06-06: qty 10

## 2022-06-06 MED ORDER — DEXAMETHASONE SODIUM PHOSPHATE 10 MG/ML IJ SOLN
8.0000 mg | Freq: Once | INTRAMUSCULAR | Status: AC
Start: 2022-06-06 — End: 2022-06-06
  Administered 2022-06-06: 8 mg via INTRAVENOUS

## 2022-06-06 MED ORDER — FENTANYL CITRATE (PF) 100 MCG/2ML IJ SOLN
INTRAMUSCULAR | Status: AC
  Filled 2022-06-06: qty 2

## 2022-06-06 MED ORDER — DEXAMETHASONE SODIUM PHOSPHATE 10 MG/ML IJ SOLN
INTRAMUSCULAR | Status: AC
  Filled 2022-06-06: qty 1

## 2022-06-06 MED ORDER — PROPOFOL 500 MG/50ML IV EMUL
INTRAVENOUS | Status: DC | PRN
  Administered 2022-06-06: 100 ug/kg/min via INTRAVENOUS

## 2022-06-06 MED ORDER — CEFAZOLIN SODIUM-DEXTROSE 2-4 GM/100ML-% IV SOLN
2.0000 g | INTRAVENOUS | Status: AC
  Administered 2022-06-06: 2 g via INTRAVENOUS
  Filled 2022-06-06: qty 100

## 2022-06-06 MED ORDER — ASPIRIN 81 MG PO TABS: 81.0000 mg | ORAL_TABLET | Freq: Two times a day (BID) | ORAL | Status: AC

## 2022-06-06 MED ORDER — TRANEXAMIC ACID-NACL 1000-0.7 MG/100ML-% IV SOLN
1000.0000 mg | INTRAVENOUS | Status: AC
  Administered 2022-06-06: 1000 mg via INTRAVENOUS
  Filled 2022-06-06: qty 100

## 2022-06-06 MED ORDER — POVIDONE-IODINE 10 % EX SWAB: 2.0000 | Freq: Once | CUTANEOUS | Status: DC

## 2022-06-06 MED ORDER — ACETAMINOPHEN 500 MG PO TABS
1000.0000 mg | ORAL_TABLET | Freq: Once | ORAL | Status: AC
  Administered 2022-06-06: 1000 mg via ORAL
  Filled 2022-06-06: qty 2

## 2022-06-06 MED ORDER — METHOCARBAMOL 500 MG PO TABS: 500.0000 mg | ORAL_TABLET | Freq: Four times a day (QID) | ORAL | Status: DC | PRN

## 2022-06-06 MED ORDER — BUPIVACAINE LIPOSOME 1.3 % IJ SUSP
INTRAMUSCULAR | Status: DC | PRN
  Administered 2022-06-06: 20 mL

## 2022-06-06 MED ORDER — CHLORHEXIDINE GLUCONATE 0.12 % MT SOLN
15.0000 mL | Freq: Once | OROMUCOSAL | Status: AC
  Administered 2022-06-06: 15 mL via OROMUCOSAL

## 2022-06-06 MED ORDER — ONDANSETRON HCL 4 MG/2ML IJ SOLN
INTRAMUSCULAR | Status: DC | PRN
  Administered 2022-06-06: 4 mg via INTRAVENOUS

## 2022-06-06 MED ORDER — OXYCODONE HCL 5 MG PO TABS: 5.0000 mg | ORAL_TABLET | ORAL | 0 refills | Status: AC | PRN

## 2022-06-06 MED ORDER — METHOCARBAMOL 500 MG PO TABS: 500.0000 mg | ORAL_TABLET | Freq: Three times a day (TID) | ORAL | 0 refills | Status: AC | PRN

## 2022-06-06 MED ORDER — 0.9 % SODIUM CHLORIDE (POUR BTL) OPTIME
TOPICAL | Status: DC | PRN
  Administered 2022-06-06: 1000 mL

## 2022-06-06 MED ORDER — MIDAZOLAM HCL 5 MG/5ML IJ SOLN
INTRAMUSCULAR | Status: DC | PRN
  Administered 2022-06-06: 2 mg via INTRAVENOUS

## 2022-06-06 MED ORDER — WATER FOR IRRIGATION, STERILE IR SOLN
Status: DC | PRN
  Administered 2022-06-06: 2000 mL

## 2022-06-06 MED ORDER — CELECOXIB 200 MG PO CAPS
400.0000 mg | ORAL_CAPSULE | Freq: Once | ORAL | Status: AC
  Administered 2022-06-06: 400 mg via ORAL
  Filled 2022-06-06: qty 2

## 2022-06-06 MED ORDER — PROPOFOL 1000 MG/100ML IV EMUL
INTRAVENOUS | Status: AC
Start: 2022-06-06 — End: ?
  Filled 2022-06-06: qty 100

## 2022-06-06 MED ORDER — SODIUM CHLORIDE (PF) 0.9 % IJ SOLN
INTRAMUSCULAR | Status: AC
  Filled 2022-06-06: qty 50

## 2022-06-06 MED ORDER — ROPIVACAINE HCL 5 MG/ML IJ SOLN
INTRAMUSCULAR | Status: DC | PRN
  Administered 2022-06-06: 25 mL via PERINEURAL

## 2022-06-06 MED ORDER — SODIUM CHLORIDE 0.9 % IR SOLN
Status: DC | PRN
  Administered 2022-06-06: 3000 mL

## 2022-06-06 MED ORDER — BUPIVACAINE IN DEXTROSE 0.75-8.25 % IT SOLN
INTRATHECAL | Status: DC | PRN
  Administered 2022-06-06: 1.8 mL via INTRATHECAL

## 2022-06-06 MED ORDER — METHOCARBAMOL 500 MG IVPB - SIMPLE MED: 500.0000 mg | Freq: Four times a day (QID) | INTRAVENOUS | Status: DC | PRN

## 2022-06-06 SURGICAL SUPPLY — 67 items
BAG COUNTER SPONGE SURGICOUNT (BAG) ×1 IMPLANT
BAG SPNG CNTER NS LX DISP (BAG) ×1
BLADE SAG 18X100X1.27 (BLADE) ×2 IMPLANT
BLADE SAW SAG 35X64 .89 (BLADE) ×2 IMPLANT
BNDG CMPR 5X3 CHSV STRCH STRL (GAUZE/BANDAGES/DRESSINGS) ×1
BNDG CMPR MED 10X6 ELC LF (GAUZE/BANDAGES/DRESSINGS) ×1
BNDG COHESIVE 3X5 TAN ST LF (GAUZE/BANDAGES/DRESSINGS) ×2 IMPLANT
BNDG ELASTIC 6X10 VLCR STRL LF (GAUZE/BANDAGES/DRESSINGS) ×2 IMPLANT
BOWL SMART MIX CTS (DISPOSABLE) ×2 IMPLANT
BSPLAT TIB 5D F CMNT STM RT (Knees) ×1 IMPLANT
CEMENT BONE R 1X40 (Cement) ×2 IMPLANT
CHLORAPREP W/TINT 26 (MISCELLANEOUS) ×4 IMPLANT
COMP FEM CMT PERSONA SZ9 RT (Joint) ×2 IMPLANT
COMPONENT FEM CMT PRSONA SZ9RT (Joint) IMPLANT
COVER SURGICAL LIGHT HANDLE (MISCELLANEOUS) ×2 IMPLANT
CUFF TOURN SGL QUICK 34 (TOURNIQUET CUFF) ×2
CUFF TRNQT CYL 34X4.125X (TOURNIQUET CUFF) ×1 IMPLANT
DERMABOND ADVANCED (GAUZE/BANDAGES/DRESSINGS) ×1
DERMABOND ADVANCED .7 DNX12 (GAUZE/BANDAGES/DRESSINGS) ×1 IMPLANT
DRAPE INCISE IOBAN 85X60 (DRAPES) ×2 IMPLANT
DRAPE SHEET LG 3/4 BI-LAMINATE (DRAPES) ×2 IMPLANT
DRAPE U-SHAPE 47X51 STRL (DRAPES) ×2 IMPLANT
DRESSING AQUACEL AG SP 3.5X10 (GAUZE/BANDAGES/DRESSINGS) ×1 IMPLANT
DRSG AQUACEL AG ADV 3.5X10 (GAUZE/BANDAGES/DRESSINGS) ×1 IMPLANT
DRSG AQUACEL AG SP 3.5X10 (GAUZE/BANDAGES/DRESSINGS) ×2
ELECT REM PT RETURN 15FT ADLT (MISCELLANEOUS) ×2 IMPLANT
GAUZE SPONGE 4X4 12PLY STRL (GAUZE/BANDAGES/DRESSINGS) ×2 IMPLANT
GLOVE BIOGEL PI IND STRL 8 (GLOVE) ×1 IMPLANT
GLOVE BIOGEL PI INDICATOR 8 (GLOVE) ×1
GLOVE SURG ORTHO 8.0 STRL STRW (GLOVE) ×4 IMPLANT
GOWN STRL REUS W/ TWL XL LVL3 (GOWN DISPOSABLE) ×1 IMPLANT
GOWN STRL REUS W/TWL XL LVL3 (GOWN DISPOSABLE) ×2
HANDPIECE INTERPULSE COAX TIP (DISPOSABLE) ×2
HDLS TROCR DRIL PIN KNEE 75 (PIN) ×2
HOLDER FOLEY CATH W/STRAP (MISCELLANEOUS) ×2 IMPLANT
HOOD PEEL AWAY FLYTE STAYCOOL (MISCELLANEOUS) ×6 IMPLANT
INSERT TIB ARTI SZ8-11 RT 11 (Joint) ×1 IMPLANT
KIT BASIN OR (CUSTOM PROCEDURE TRAY) ×1 IMPLANT
MANIFOLD NEPTUNE II (INSTRUMENTS) ×2 IMPLANT
MARKER SKIN DUAL TIP RULER LAB (MISCELLANEOUS) ×2 IMPLANT
NS IRRIG 1000ML POUR BTL (IV SOLUTION) ×2 IMPLANT
PACK TOTAL KNEE CUSTOM (KITS) ×2 IMPLANT
PIN DRILL HDLS TROCAR 75 4PK (PIN) IMPLANT
PROTECTOR NERVE ULNAR (MISCELLANEOUS) ×2 IMPLANT
SCREW HEADED 33MM KNEE (MISCELLANEOUS) ×2 IMPLANT
SET HNDPC FAN SPRY TIP SCT (DISPOSABLE) ×1 IMPLANT
SOLUTION IRRIG SURGIPHOR (IV SOLUTION) IMPLANT
SPIKE FLUID TRANSFER (MISCELLANEOUS) ×1 IMPLANT
STEM POLY PAT PLY 32M KNEE (Knees) ×1 IMPLANT
STEM TIBIA 5 DEG SZ F R KNEE (Knees) IMPLANT
STRIP CLOSURE SKIN 1/2X4 (GAUZE/BANDAGES/DRESSINGS) ×2 IMPLANT
SUT MNCRL AB 3-0 PS2 18 (SUTURE) ×2 IMPLANT
SUT STRATAFIX 0 PDS 27 VIOLET (SUTURE) ×2
SUT STRATAFIX PDO 1 14 VIOLET (SUTURE) ×2
SUT STRATFX PDO 1 14 VIOLET (SUTURE) ×1
SUT VIC AB 2-0 CT2 27 (SUTURE) ×4 IMPLANT
SUTURE STRATFX 0 PDS 27 VIOLET (SUTURE) ×1 IMPLANT
SUTURE STRATFX PDO 1 14 VIOLET (SUTURE) ×1 IMPLANT
SYR 50ML LL SCALE MARK (SYRINGE) ×2 IMPLANT
TIBIA STEM 5 DEG SZ F R KNEE (Knees) ×2 IMPLANT
TRAY FOL W/BAG SLVR 16FR STRL (SET/KITS/TRAYS/PACK) IMPLANT
TRAY FOLEY MTR SLVR 14FR STAT (SET/KITS/TRAYS/PACK) IMPLANT
TRAY FOLEY MTR SLVR 16FR STAT (SET/KITS/TRAYS/PACK) ×1 IMPLANT
TRAY FOLEY W/BAG SLVR 16FR LF (SET/KITS/TRAYS/PACK)
TUBE SUCTION HIGH CAP CLEAR NV (SUCTIONS) ×2 IMPLANT
UNDERPAD 30X36 HEAVY ABSORB (UNDERPADS AND DIAPERS) ×2 IMPLANT
WRAP KNEE MAXI GEL POST OP (GAUZE/BANDAGES/DRESSINGS) ×1 IMPLANT

## 2022-06-06 NOTE — Discharge Instructions (Signed)

## 2022-06-06 NOTE — Progress Notes (Signed)
Orthopedic Tech Progress Note Patient Details:  Ralph [name] [birth year] [id number]  Ortho Devices Type of Ortho Device: Bone foam zero knee Ortho Device/Splint Location: right Ortho Device/Splint Interventions: Application   Post Interventions Patient Tolerated: Well Instructions Provided: Care of device  [place] Pink 06/06/2022, 9:34 AM

## 2022-06-06 NOTE — H&P (Signed)
KNEE ARTHROPLASTY ADMISSION H&P  Patient ID: Ralph [name] MRN: [id number] DOB/AGE: December 06, 1950 71 y.o.  Chief Complaint: right knee pain.  Planned Procedure Date: 06/06/22    HPI: Ralph [name] is a 71 y.o. male who presents for evaluation of OA RIGHT KNEE. The patient has a history of pain and functional disability in the right knee due to arthritis and has failed non-surgical conservative treatments for greater than 12 weeks to include NSAID's and/or analgesics.  Onset of symptoms was gradual, starting 1 years ago with gradually worsening course since that time. The patient noted no past surgery on the right knee.  Patient currently rates pain at 7 out of 10 with activity. Patient has worsening of pain with activity and weight bearing and pain that interferes with activities of daily living.  Patient has evidence of joint space narrowing by imaging studies.  There is no active infection.  Past Medical History:  Diagnosis Date   Arthritis    CAD (coronary artery disease)    Multivessel status post CABG December 2011 - Dr. [name]   Essential hypertension    History of kidney stones    Hyperlipidemia    Neck pain    NSTEMI (non-ST elevated myocardial infarction) ([place]) 10/21/2010   Pre-diabetes    Prostate cancer [facility])    Radical prostatectomy   Past Surgical History:  Procedure Laterality Date   COLONOSCOPY N/A 07/31/2019   Procedure: COLONOSCOPY;  Surgeon: Rogene Houston, MD;  [facility];  Service: Endoscopy;  Laterality: N/A;  30   CORONARY ARTERY BYPASS GRAFT  December 2011   LIMA to LAD, L Radial to OM2, SVG to DX, SVG to PD   CYSTOSCOPY WITH LITHOLAPAXY N/A 10/01/2019   Procedure: CYSTOSCOPY WITH LITHOLAPAXY, HOLMIUM LASER OF BLADDER STONE;  Surgeon: Irine Seal, MD;  [facility];  Service: Urology;  Laterality: N/A;   KNEE ARTHROSCOPY Left 12/18/2013   Procedure: LEFT ARTHROSCOPY KNEE WITH DEBRIDMENT medial and lateral menisectomy condroplasty;  Surgeon:  Gearlean Alf, MD;  [facility];  Service: Orthopedics;  Laterality: Left;   LITHOTRIPSY     X 2   PARTIAL KNEE ARTHROPLASTY Left 06/01/2015   Procedure: LEFT KNEE MEDIAL UNICOMPARTMENTAL ARTHROPLASTY;  Surgeon: Gaynelle Arabian, MD;  [facility];  Service: Orthopedics;  Laterality: Left;   POLYPECTOMY  07/31/2019   Procedure: POLYPECTOMY;  Surgeon: Rogene Houston, MD;  [facility];  Service: Endoscopy;;  cecum,ascending,transverse,splenic flexure   PROSTATECTOMY  2008   TONSILLECTOMY     No Known Allergies Prior to Admission medications   Medication Sig Start Date End Date Taking? Authorizing Provider  aspirin 81 MG tablet Take 1 tablet (81 mg total) by mouth daily. 08/01/19  Yes Rehman, Mechele Dawley, MD  atorvastatin (LIPITOR) 40 MG tablet TAKE 1 TABLET BY MOUTH EVERY DAY IN THE MORNING 06/03/22  Yes Satira Sark, MD  celecoxib (CELEBREX) 200 MG capsule Take 200 mg by mouth daily. 03/31/22  Yes [provider]  isosorbide mononitrate (IMDUR) 30 MG 24 hr tablet TAKE 1 TABLET BY MOUTH EVERY DAY 06/28/21  Yes Satira Sark, MD  lisinopril (ZESTRIL) 5 MG tablet TAKE 1 TABLET (5 MG TOTAL) BY MOUTH EVERY MORNING. 06/14/21  Yes Satira Sark, MD  metoprolol tartrate (LOPRESSOR) 25 MG tablet Take 0.5 tablets (12.5 mg total) by mouth 2 (two) times daily. 05/28/21 06/06/22 Yes Verta Ellen., NP  Omega-3 Fatty Acids (FISH OIL) 1200 MG CPDR Take 1,200 mg  by mouth daily. Omega3 360 mg   Yes [provider]   Social History   Socioeconomic History   Marital status: Married    Spouse name: Not on file   Number of children: Not on file   Years of education: Not on file   Highest education level: Not on file  Occupational History   Not on file  Tobacco Use   Smoking status: Former    Packs/day: 0.80    Years: 10.00    Total pack years: 8.00    Types: Cigarettes    Start date: 11/21/1978    Quit date: 11/21/1988    Years since quitting: 33.5    Smokeless tobacco: Never   Tobacco comments:    Stopped over 20 years ago.  Vaping Use   Vaping Use: Never used  Substance and Sexual Activity   Alcohol use: Yes    Comment: very seldom   Drug use: No   Sexual activity: Not on file  Other Topics Concern   Not on file  Social History Narrative   Not on file   Social Determinants of Health   Financial Resource Strain: Not on file  Food Insecurity: Not on file  Transportation Needs: Not on file  Physical Activity: Not on file  Stress: Not on file  Social Connections: Not on file   Family History  Problem Relation Age of Onset   Alzheimer's disease Mother    Stroke Father     ROS: Currently denies lightheadedness, dizziness, Fever, chills, CP, SOB.   No personal history of DVT, PE, MI, or CVA. No loose teeth or dentures All other systems have been reviewed and were otherwise currently negative with the exception of those mentioned in the HPI and as above.  Objective: Vitals: Ht: 5'8"  Wt: 98kg  BMI 33 Physical Exam: General: Alert, NAD.  Antalgic Gait  Neuro: Neuro without gross focal deficit.  Sensation intact distally Skin: No lesions in the area of chief complaint MSK/Surgical Site: right knee w/o redness or effusion.  medial JLT. ROM 5-120.  5/5 strength in extension and flexion.  +EHL/FHL.  NVI.  Stable varus and valgus stress.    Imaging Review Plain radiographs demonstrate severe degenerative joint disease of the right knee.   The overall alignment ismild varus. The bone quality appears to be good for age and reported activity level.  Preoperative templating of the joint replacement has been completed, documented, and submitted to the Operating Room personnel in order to optimize intra-operative equipment management.  Assessment: OA RIGHT KNEE Active Problems:   * No active hospital problems. *   Plan: Plan for Procedure(s): TOTAL KNEE ARTHROPLASTY  The patient history, physical exam, clinical judgement  of the provider and imaging are consistent with end stage degenerative joint disease and total joint arthroplasty is deemed medically necessary. The treatment options including medical management, injection therapy, and arthroplasty were discussed at length. The risks and benefits of Procedure(s): TOTAL KNEE ARTHROPLASTY were presented and reviewed.  The risks of nonoperative treatment, versus surgical intervention including but not limited to continued pain, aseptic loosening, stiffness, dislocation/subluxation, infection, bleeding, nerve injury, blood clots, cardiopulmonary complications, morbidity, mortality, among others were discussed. The patient verbalizes understanding and wishes to proceed with the plan.  Patient is being admitted for inpatient treatment for surgery, pain control, PT, prophylactic antibiotics, VTE prophylaxis, progressive ambulation, ADL's and discharge planning.   Dental prophylaxis discussed and recommended for 2 years postoperatively.  The patient does meet the criteria for  TXA which will be used perioperatively.   ASA 81 mg BID  will be used postoperatively for DVT prophylaxis in addition to SCDs, and early ambulation. Plan for Tylenol, Celebrex, Gabapentin, oxycodone for pain.  Robaxin  for spasm.   The patient is planning to be discharged home with OPPT  Patient's anticipated LOS is less than 2 midnights, meeting these requirements: - Younger than 26 - Lives within 1 hour of care - Has a competent adult at home to recover with post-op recover - NO history of  - Chronic pain requiring opiods  - Diabetes  - Coronary Artery Disease  - Heart failure  - Heart attack  - Stroke  - DVT/VTE  - Cardiac arrhythmia  - Respiratory Failure/COPD  - Renal failure  - Anemia  - Advanced Liver disease      Fumie Fiallo A Alicia Ackert,MD 06/06/2022 7:07 AM

## 2022-06-06 NOTE — Anesthesia Procedure Notes (Signed)
Spinal  Patient location during procedure: OR End time: 06/06/2022 7:35 AM Reason for block: surgical anesthesia Staffing Performed: anesthesiologist  Anesthesiologist: Nolon Nations, MD Resident/CRNA: Marijo Conception, CRNA Performed by: Garan Frappier, Clinical cytogeneticist D, CRNA Authorized by: Janeece Riggers, MD   Preanesthetic Checklist Completed: patient identified, IV checked, site marked, risks and benefits discussed, surgical consent, monitors and equipment checked, pre-op evaluation and timeout performed Spinal Block Patient position: sitting Prep: Betadine and DuraPrep Patient monitoring: heart rate, continuous pulse ox and blood pressure Approach: midline Location: L3-4 Injection technique: single-shot Needle Needle type: Sprotte  Needle gauge: 24 G Needle length: 9 cm Assessment Sensory level: T6 Events: CSF return

## 2022-06-06 NOTE — Anesthesia Postprocedure Evaluation (Signed)
Anesthesia Post Note  Patient: Ralph [name]  Procedure(s) Performed: TOTAL KNEE ARTHROPLASTY (Right: Knee)     Patient location during evaluation: PACU Anesthesia Type: Regional and Spinal Level of consciousness: awake and alert Pain management: pain level controlled Vital Signs Assessment: post-procedure vital signs reviewed and stable Respiratory status: spontaneous breathing, nonlabored ventilation, respiratory function stable and patient connected to nasal cannula oxygen Cardiovascular status: blood pressure returned to baseline and stable Postop Assessment: no apparent nausea or vomiting Anesthetic complications: no   No notable events documented.  Last Vitals:  Vitals:   06/06/22 1110 06/06/22 1134  BP: 114/63 135/75  Pulse: (!) 54 (!) 56  Resp: 12 14  Temp: 36.5 C 36.7 C  SpO2: 96% 93%    Last Pain:  Vitals:   06/06/22 1134  TempSrc:   PainSc: 0-No pain                 Djuan Talton

## 2022-06-06 NOTE — Evaluation (Signed)
Physical Therapy Evaluation Patient Details Name: Ralph [name] MRN: [id number] DOB: 31-Jan-1951 Today's Date: 06/06/2022  History of Present Illness  Pt is a 71yo male presenting s/p R-TKA on 06/06/22. PMH: CAD s/p CABG, HTN, HLD, hx of NSETMI, prediabetes, hx of prostate cancer s/p prostatectomy, L-TKA 2015.  Clinical Impression  Ralph [name] is a 71 y.o. male POD 0 s/p R-TKA. Patient reports IND with mobility at baseline. Patient is now limited by functional impairments (see PT problem list below) and requires min guard for transfers and gait with RW. Patient was able to ambulate 60 feet with RW and min guard and cues for safe walker management. Patient educated on safe sequencing for stair mobility and verbalized safe guarding position for people assisting with mobility. Patient instructed in exercises to facilitate ROM and circulation. Patient will benefit from continued skilled PT interventions to address impairments and progress towards PLOF. Patient has met mobility goals at adequate level for discharge home; will continue to follow if pt continues acute stay to progress towards Mod I goals.        Recommendations for follow up therapy are one component of a multi-disciplinary discharge planning process, led by the attending physician.  Recommendations may be updated based on patient status, additional functional criteria and insurance authorization.  Follow Up Recommendations Follow physician's recommendations for discharge plan and follow up therapies      Assistance Recommended at Discharge Set up Supervision/Assistance  Patient can return home with the following  A little help with walking and/or transfers;A little help with bathing/dressing/bathroom;Assistance with cooking/housework;Assist for transportation;Help with stairs or ramp for entrance    Equipment Recommendations None recommended by PT (pt has recommended DME)  Recommendations for Other Services       Functional  Status Assessment Patient has had a recent decline in their functional status and demonstrates the ability to make significant improvements in function in a reasonable and predictable amount of time.     Precautions / Restrictions Precautions Precautions: Fall Restrictions Weight Bearing Restrictions: No Other Position/Activity Restrictions: WBAT      Mobility  Bed Mobility Overal bed mobility: Modified Independent             General bed mobility comments: increased time    Transfers Overall transfer level: Needs assistance Equipment used: Rolling walker (2 wheels) Transfers: Sit to/from Stand Sit to Stand: Min guard           General transfer comment: For safety only, multimodal cues for sequencing and powering up through LLE and BUE on bed    Ambulation/Gait Ambulation/Gait assistance: Min guard Gait Distance (Feet): 60 Feet Assistive device: Rolling walker (2 wheels) Gait Pattern/deviations: Step-to pattern Gait velocity: decreased     General Gait Details: Pt ambulated with RW and min guard, no physical assist or overt LOB noted.  Stairs Stairs: Yes Stairs assistance: Min guard Stair Management: One rail Right, One rail Left, Step to pattern, Sideways, Forwards, With crutches Number of Stairs: 4 General stair comments: Pt educated on two techniques for stair mobility: sideways using one rail on R and forwards using rail on L and crutches on R, verbalized understanding, handout provided. Demonstrated safe technique with both with min guard, no overt LOB noted, VCs for sequencing and hand placement. Used teachback method x2 to verify pt understanding of LE sequencing.  Wheelchair Mobility    Modified Rankin (Stroke Patients Only)       Balance Overall balance assessment: Needs assistance Sitting-balance support: Feet supported, No  upper extremity supported Sitting balance-Leahy Scale: Good     Standing balance support: Reliant on assistive device  for balance, During functional activity, Bilateral upper extremity supported Standing balance-Leahy Scale: Poor                               Pertinent Vitals/Pain Pain Assessment Pain Assessment: No/denies pain    Home Living Family/patient expects to be discharged to:: Private residence Living Arrangements: Spouse/significant other Available Help at Discharge: Family;Available 24 hours/day Type of Home: House Home Access: Stairs to enter Entrance Stairs-Rails: Chemical engineer of Steps: 3 Alternate Level Stairs-Number of Steps: 7 Home Layout: Multi-level Home Equipment: Crutches;Shower [place] (2 wheels)      Prior Function Prior Level of Function : Independent/Modified Independent             Mobility Comments: ind ADLs Comments: ind     Hand Dominance   Dominant Hand: Right    Extremity/Trunk Assessment   Upper Extremity Assessment Upper Extremity Assessment: Overall WFL for tasks assessed    Lower Extremity Assessment Lower Extremity Assessment: RLE deficits/detail;LLE deficits/detail RLE Deficits / Details: MMT ank DF/PF 5/5, no extensor lag noted RLE Sensation: WNL LLE Deficits / Details: MMT ank DF/PF 5/5 LLE Sensation: WNL    Cervical / Trunk Assessment Cervical / Trunk Assessment: Normal  Communication   Communication: No difficulties  Cognition Arousal/Alertness: Awake/alert Behavior During Therapy: WFL for tasks assessed/performed Overall Cognitive Status: Within Functional Limits for tasks assessed                                          General Comments General comments (skin integrity, edema, etc.): Wife arlene present    Exercises Total Joint Exercises Ankle Circles/Pumps: AROM, Both, 10 reps Quad Sets: AROM, Right, Other reps (comment) (2) Short Arc Quad: AROM, Right, Other reps (comment) (3) Heel Slides: AROM, Right, Other reps (comment) (2) Hip ABduction/ADduction: AROM,  Right, 5 reps Straight Leg Raises: AROM, Right, Other reps (comment) (2)   Assessment/Plan    PT Assessment Patient needs continued PT services  PT Problem List Decreased strength;Decreased range of motion;Decreased activity tolerance;Decreased balance;Decreased mobility;Decreased coordination;Pain       PT Treatment Interventions DME instruction;Gait training;Stair training;Functional mobility training;Therapeutic activities;Therapeutic exercise;Balance training;Neuromuscular re-education;Patient/family education    PT Goals (Current goals can be found in the Care Plan section)  Acute Rehab PT Goals Patient Stated Goal: walk without pain, play golf PT Goal Formulation: With patient Time For Goal Achievement: 06/13/22 Potential to Achieve Goals: Good    Frequency 7X/week     Co-evaluation               AM-PAC PT "6 Clicks" Mobility  Outcome Measure Help needed turning from your back to your side while in a flat bed without using bedrails?: None Help needed moving from lying on your back to sitting on the side of a flat bed without using bedrails?: None Help needed moving to and from a bed to a chair (including a wheelchair)?: A Little Help needed standing up from a chair using your arms (e.g., wheelchair or bedside chair)?: A Little Help needed to walk in hospital room?: A Little Help needed climbing 3-5 steps with a railing? : A Little 6 Click Score: 20    End of Session Equipment Utilized During Treatment: Gait  belt Activity Tolerance: Patient tolerated treatment well;No increased pain Patient left: in chair;with call bell/phone within reach;with nursing/sitter in room;with family/visitor present Nurse Communication: Mobility status PT Visit Diagnosis: Difficulty in walking, not elsewhere classified (R26.2)    Time: 1347-1430 PT Time Calculation (min) (ACUTE ONLY): 43 min   Charges:   PT Evaluation $PT Eval Low Complexity: 1 Low PT Treatments $Gait Training:  8-22 mins $Therapeutic Exercise: 8-22 mins        Coolidge Breeze, PT, DPT [facility] Office: [phone] Pager: [phone]  Coolidge Breeze 06/06/2022, 2:38 PM

## 2022-06-06 NOTE — Anesthesia Procedure Notes (Signed)
Anesthesia Regional Block: Adductor canal block   Pre-Anesthetic Checklist: , timeout performed,  Correct Patient, Correct Site, Correct Laterality,  Correct Procedure, Correct Position, site marked,  Risks and benefits discussed,  Surgical consent,  Pre-op evaluation,  At surgeon's request and post-op pain management  Laterality: Right  Prep: chloraprep       Needles:  Injection technique: Single-shot  Needle Type: Echogenic Stimulator Needle     Needle Length: 5cm  Needle Gauge: 22     Additional Needles:   Procedures:, nerve stimulator,,, ultrasound used (permanent image in chart),,    Narrative:  Start time: 06/06/2022 6:50 AM End time: 06/06/2022 6:55 AM Injection made incrementally with aspirations every 5 mL.  Performed by: Personally  Anesthesiologist: Duane Boston, MD  Additional Notes: Functioning IV was confirmed and monitors were applied.  A 22m 22ga Arrow echogenic stimulator needle was used. Sterile prep and drape,hand hygiene and sterile gloves were used. Ultrasound guidance: relevant anatomy identified, needle position confirmed, local anesthetic spread visualized around nerve(s)., vascular puncture avoided.  Image printed for medical record. Negative aspiration and negative test dose prior to incremental administration of local anesthetic. The patient tolerated the procedure well.

## 2022-06-06 NOTE — Op Note (Signed)
DATE OF SURGERY:  06/06/2022 TIME: 9:25 AM  PATIENT NAME:  Ralph [name]   AGE: 71 y.o.    PRE-OPERATIVE DIAGNOSIS: End-stage right knee osteoarthritis  POST-OPERATIVE DIAGNOSIS:  Same  PROCEDURE: Right total Knee Arthroplasty  SURGEON:  Kiernan Atkerson A Jadda Hunsucker, MD   ASSISTANT: Izola Price, RNFA, present and scrubbed throughout the case, critical for assistance with exposure, retraction, instrumentation, and closure.   OPERATIVE IMPLANTS:  Cemented Zimmer persona 9 CR femur standard, F tibia right baseplate, 32 mm patella, 11 mm MC poly liner Implant Name Type Inv. Item Serial No. Manufacturer Lot No. LRB No. Used Action  CEMENT BONE R 1X40 - [id number] Cement CEMENT BONE R 1X40  ZIMMER RECON(ORTH,TRAU,BIO,SG) [id number] Right 1 Implanted  CEMENT BONE R 1X40 - [id number] Cement CEMENT BONE R 1X40  ZIMMER [employer]ON(ORTH,TRAU,BIO,SG) [id number] Right 1 Implanted  TIBIA STEM 5 DEG SZ F R KNEE - [id number] Knees TIBIA STEM 5 DEG SZ F R KNEE  ZIMMER RECON(ORTH,TRAU,BIO,SG) [id number] Right 1 Implanted  COMP FEM CMT PERSONA SZ9 RT - [id number] Joint COMP FEM CMT PERSONA SZ9 RT  ZIMMER RECON(ORTH,TRAU,BIO,SG) [id number] Right 1 Implanted  STEM POLY PAT PLY 70M KNEE - [id number] Knees STEM POLY PAT PLY 70M KNEE  ZIMMER RECON(ORTH,TRAU,BIO,SG) [id number] Right 1 Implanted  INSERT TIB ARTI SZ8-11 RT 11 - [id number] Joint INSERT TIB ARTI SZ8-11 RT 11  ZIMMER RECON(ORTH,TRAU,BIO,SG) [id number] Right 1 Implanted      PREOPERATIVE INDICATIONS:  Ralph [name] is a 71 y.o. year old male with end stage bone on bone degenerative arthritis of the knee who failed conservative treatment, including injections, antiinflammatories, activity modification, and assistive devices, and had significant impairment of their activities of daily living, and elected for Total Knee Arthroplasty.   The risks, benefits, and alternatives were discussed at length including but not limited to the risks of infection, bleeding, nerve injury,  stiffness, blood clots, the need for revision surgery, cardiopulmonary complications, among others, and they were willing to proceed.  ESTIMATED BLOOD LOSS: 50cc  OPERATIVE DESCRIPTION:   Once adequate anesthesia was induced, preoperative antibiotics, 2 gm of ancef,1 gm of Tranexamic Acid, and 8 mg of Decadron administered, the patient was positioned supine with a right thigh tourniquet placed.  The right lower extremity was prepped and draped in sterile fashion.  A time-  out was performed identifying the patient, planned procedure, and the appropriate extremity.     The leg was  exsanguinated, tourniquet elevated to 250 mmHg.  A midline incision was  made followed by median parapatellar arthrotomy. Anterior horn of the medial meniscus was released and resected. A medial release was performed, the infrapatellar fat pad was resected with care taken to protect the patellar tendon. The suprapatellar fat was removed to exposed the distal anterior femur. The anterior horn of the lateral meniscus and ACL were released.    Following initial  exposure, attention was first to the femur.  The femoral  canal was opened with a drill, canal was suctioned to try to prevent fat emboli.  An  intramedullary rod was passed set at 5 degrees valgus, 11 mm. The distal femur was resected.  Following this resection, the tibia was  subluxated anteriorly.  Using the extramedullary guide, 10 mm of bone was resected off   the proximal lateral tibia.  We confirmed the gap would be  stable medially and laterally with a size 59m spacer block as well as confirmed that the tibial cut was perpendicular in the coronal plane, checking with an  alignment rod.    Once this was done, the posterior femoral referencing femoral sizer was placed under to the posterior condyles with 3 degrees of external rotational which was parallel to the transepicondylar axis and perpendicular to [employer]. The femur was sized to be a size 9 in the  anterior-  posterior dimension. The  anterior, posterior, and  chamfer cuts were made without difficulty nor   notching making certain that I was along the anterior cortex to help  with flexion gap stability. Next a laminar spreader was placed with the knee in flexion and the medial lateral menisci were resected.  5 cc of the Exparel mixture was injected in the medial side of the back of the knee and 3 cc in the lateral side.  1/2 inch curved osteotome was used to resect posterior osteophyte that was then removed with a pituitary rongeur.       At this point, the tibia was sized to be a size F.  The size F tray was  then pinned in position. Trial reduction was now carried with a 9 femur, F tibia, a 10 mm MC insert.  The knee had full extension and was stable to varus valgus stress in extension.  The knee was slightly tight in flexion and the PCL was partially released.   Attention was next directed to the patella.  Precut  measurement was noted to be 24 mm.  I resected down to 14 mm and used a  93m patellar button to restore patellar height as well as cover the cut surface.     The patella lug holes were drilled and a 32 mm patella poly trial was placed.    The knee was brought to full extension with good flexion stability with the patella tracking through the trochlea without application of pressure.     Next the femoral component was again assessed and determined to be seated and appropriately lateralized.  The femoral lug holes were drilled.  The femoral component was then removed. Tibial component was again assessed and felt to be seated and appropriately rotated with the medial third of the tubercle. The tibia was then drilled, and keel punched.     Final components were  opened and regular cement was mixed.      Final implants were then  cemented onto cleaned and dried cut surfaces of bone with the knee brought to extension with a 10 mm MC poly.  The knee was irrigated with sterile Betadine  diluted in saline as well as pulse lavage normal saline.  The synovial lining was  then injected a dilute Exparel.      Once the cement had fully cured, excess cement was removed throughout the knee. Medial laxity about 2-344mwith 1036moly, trialed a 39m9mly which only had about 1mm 1mut medially. I confirmed that I was satisfied with the range of motion and stability, and the final 11 mm MC poly insert was chosen.  It was placed into the knee.         The tourniquet had been let down.  No significant hemostasis was required.  The medial parapatellar arthrotomy was then reapproximated using #1 Stratafix sutures with the knee  in flexion.  The remaining wound was closed with 0 stratafix, 2-0 Vicryl, and running 3-0 Monocryl. The knee was cleaned, dried, dressed sterilely using Dermabond and   Aquacel dressing.  The patient was then brought to recovery room in stable condition, tolerating the procedure  well. There  were no complications.   Post op recs: WB: WBAT Abx: ancef  Imaging: PACU xrays DVT prophylaxis: Aspirin '81mg'$  BID x4 weeks Follow up: 2 weeks after surgery for a wound check with Dr. Zachery Dakins at [facility].  Address: [address], [place], [place] [address]  Office Phone: [phone]  Charlies Constable, MD Orthopaedic Surgery

## 2022-06-06 NOTE — Transfer of Care (Signed)
Immediate Anesthesia Transfer of Care Note  Patient: Ralph [name]  Procedure(s) Performed: TOTAL KNEE ARTHROPLASTY (Right: Knee)  Patient Location: PACU  Anesthesia Type:Regional and Spinal  Level of Consciousness: awake, alert  and oriented  Airway & Oxygen Therapy: Patient Spontanous Breathing and Patient connected to face mask oxygen  Post-op Assessment: Report given to RN and Post -op Vital signs reviewed and stable  Post vital signs: Reviewed and stable  Last Vitals:  Vitals Value Taken Time  BP    Temp    Pulse 60 06/06/22 0940  Resp 15 06/06/22 0940  SpO2 100 % 06/06/22 0940  Vitals shown include unvalidated device data.  Last Pain:  Vitals:   06/06/22 0619  TempSrc:   PainSc: 2       Patients Stated Pain Goal: 3 (22/44/97 5300)  Complications: No notable events documented.

## 2022-06-07 ENCOUNTER — Encounter (HOSPITAL_COMMUNITY): Payer: Self-pay | Admitting: Orthopedic Surgery

## 2022-06-25 ENCOUNTER — Other Ambulatory Visit: Payer: Self-pay | Admitting: Cardiology

## 2022-07-04 ENCOUNTER — Other Ambulatory Visit: Payer: Self-pay

## 2022-07-04 MED ORDER — METOPROLOL TARTRATE 25 MG PO TABS: 12.5000 mg | ORAL_TABLET | Freq: Two times a day (BID) | ORAL | 3 refills | Status: DC

## 2022-07-04 NOTE — Telephone Encounter (Signed)
Refilled lopressor as requested

## 2022-09-23 IMAGING — US US CAROTID DUPLEX BILAT
1 series · 13 of 24 positions shown · non-contrast
Comparison: None Available.

CLINICAL DATA: Preoperative clearance for knee replacement,
hypertension, hyperlipidemia

EXAM:
BILATERAL CAROTID DUPLEX ULTRASOUND
TECHNIQUE: Gray scale imaging, color Doppler and duplex ultrasound were
performed of bilateral carotid and vertebral arteries in the neck.

[Series 1: us carotid bilateral · 13 of 68 slices shown]
[im 1/68]
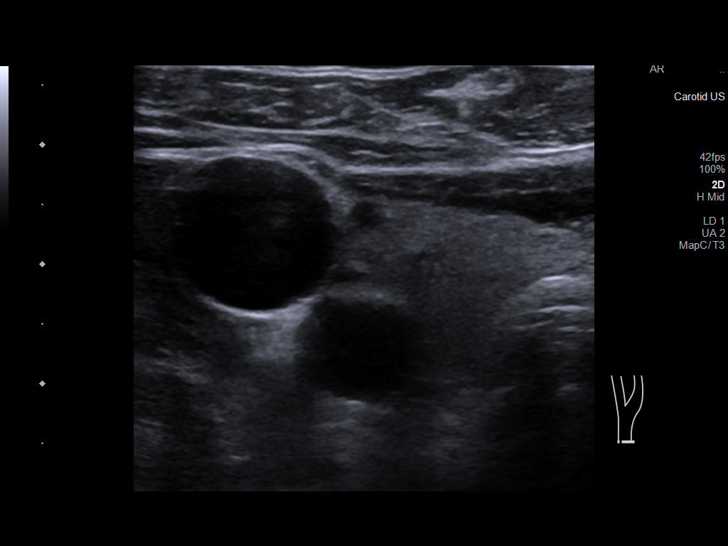
[im 6/68]
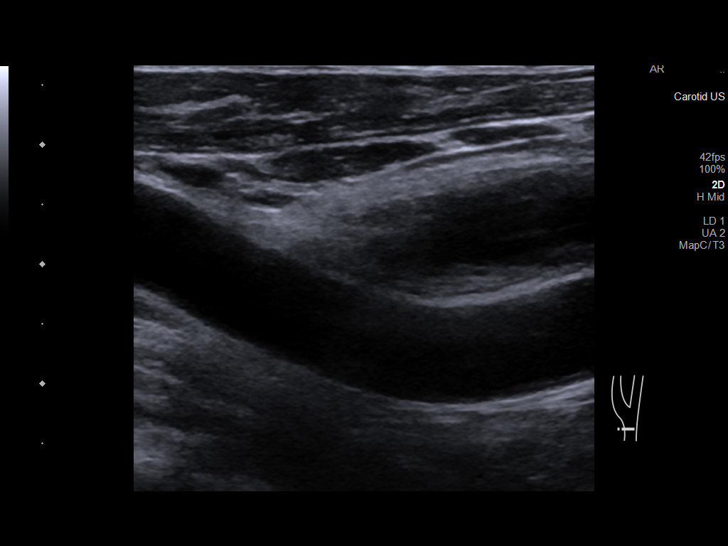
[im 12/68]
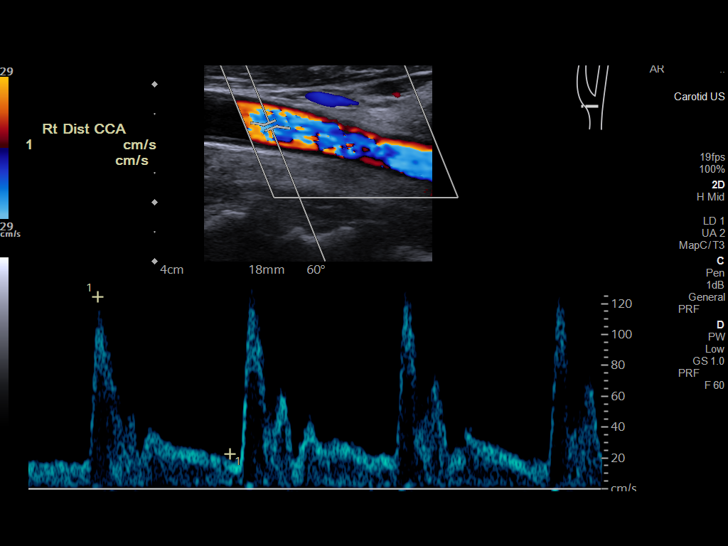
[im 18/68]
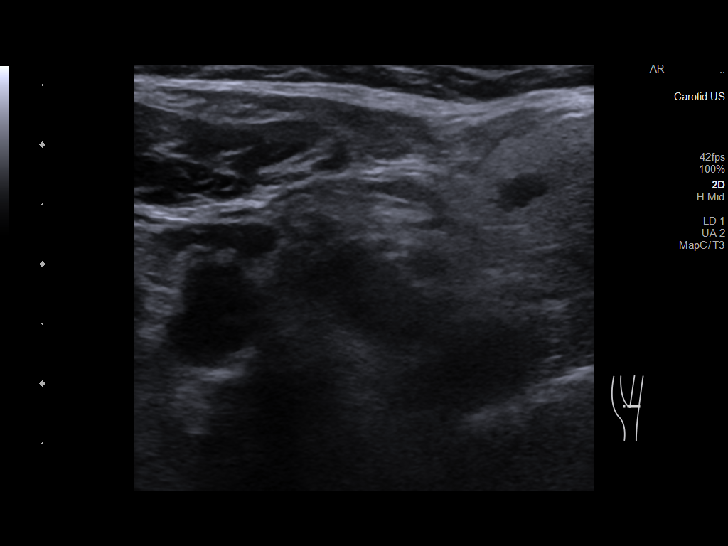
[im 24/68]
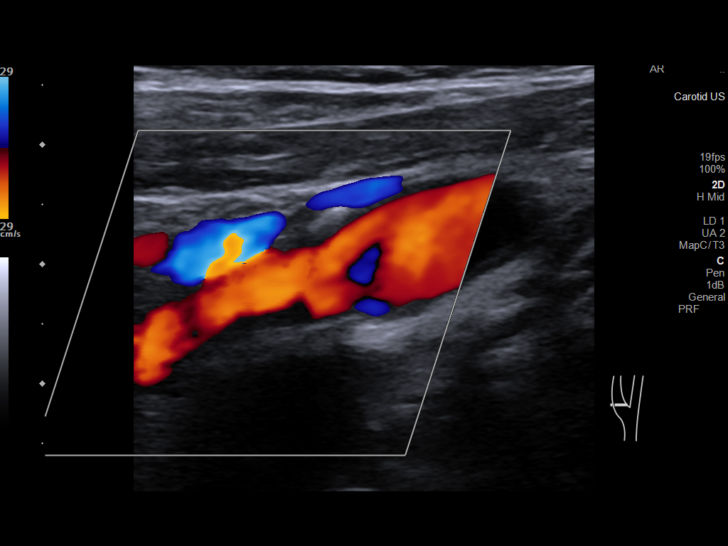
[im 30/68]
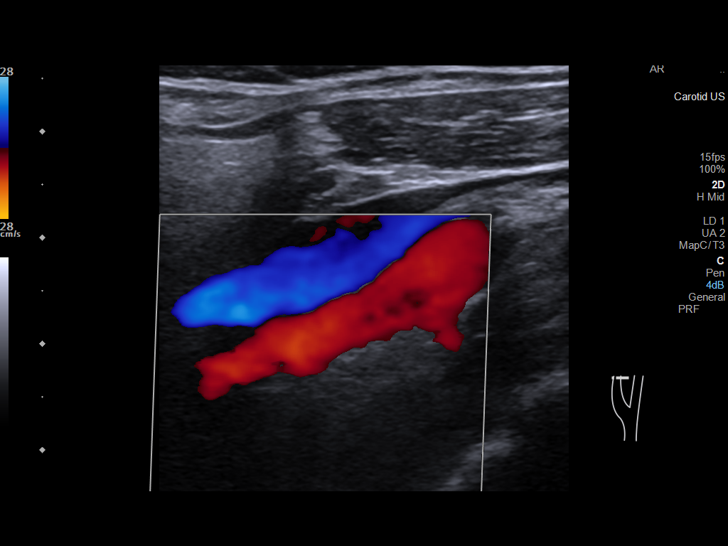
[im 35/68]
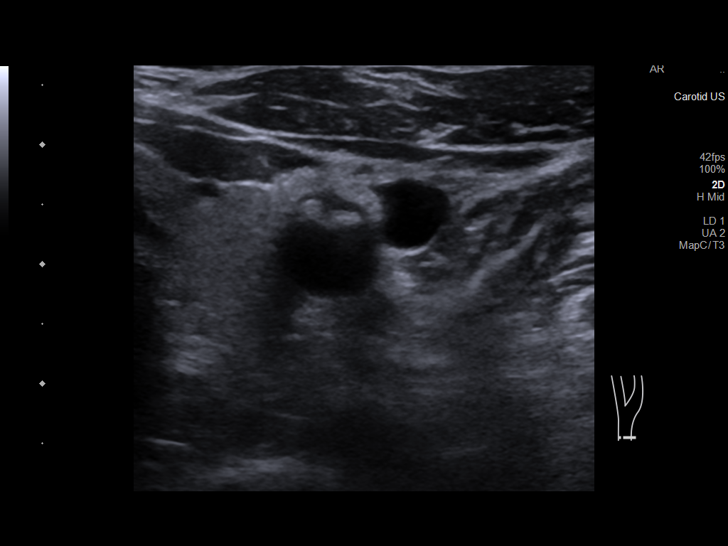
[im 38/68]
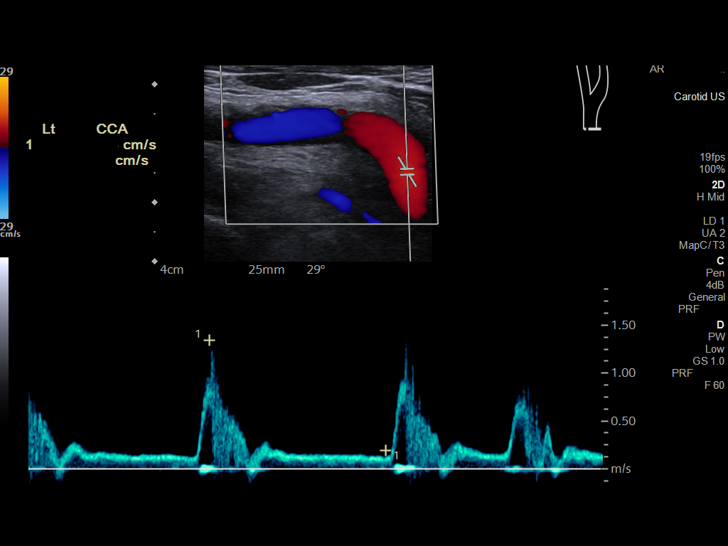
[im 44/68]
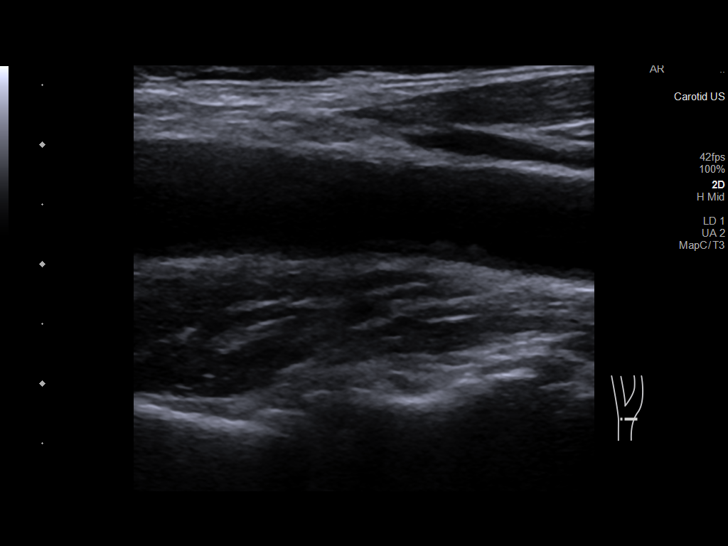
[im 50/68]
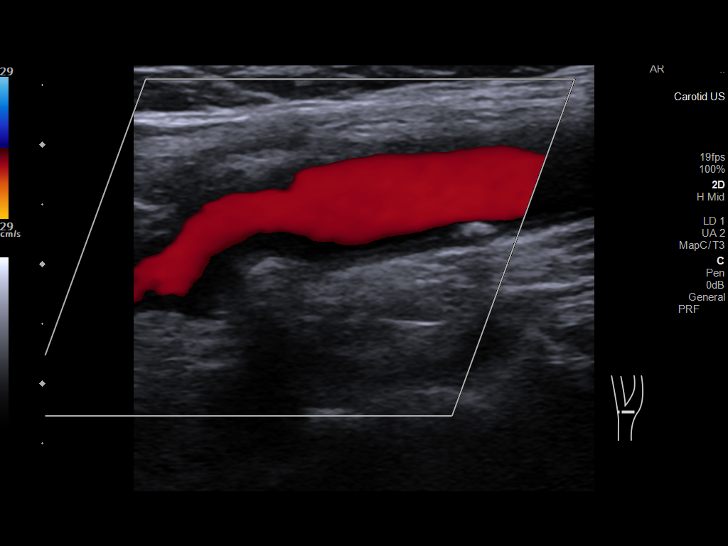
[im 56/68]
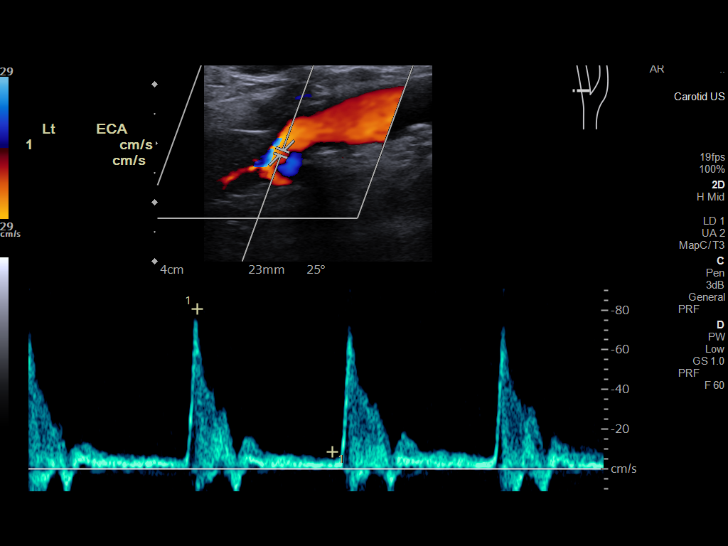
[im 62/68]
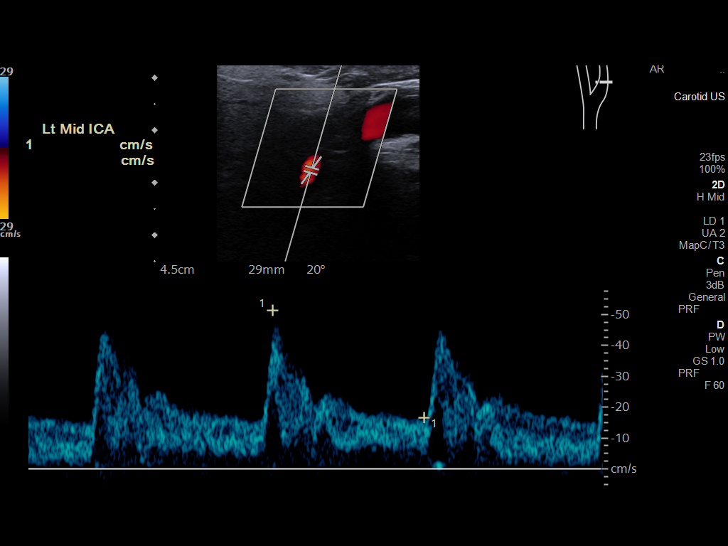
[im 68/68]
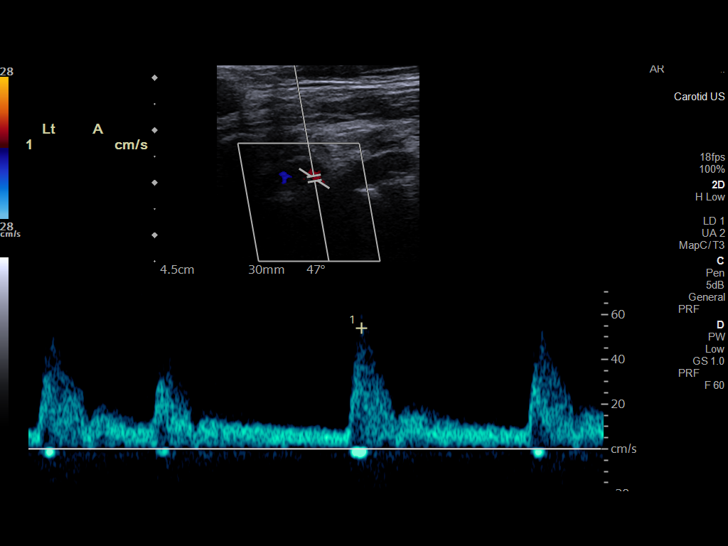

[13 of 24 positions shown; findings below may reference images not displayed]

FINDINGS: Criteria: Quantification of carotid stenosis is based on velocity
parameters that correlate the residual internal carotid diameter
with NASCET-based stenosis levels, using the diameter of the distal
internal carotid lumen as the denominator for stenosis measurement.

The following velocity measurements were obtained:

RIGHT

ICA: 98/23 cm/sec

CCA: 148/22 cm/sec

SYSTOLIC ICA/CCA RATIO:

ECA: 115 cm/sec

LEFT

ICA: 65/18 cm/sec

CCA: 160/26 cm/sec

SYSTOLIC ICA/CCA RATIO:

ECA: 81 cm/sec

RIGHT CAROTID ARTERY: Intimal thickening and calcified bifurcation
atherosclerosis. Despite this, no significant stenosis, velocity
elevation, turbulent flow. Degree of narrowing less than 50% by
ultrasound criteria.

RIGHT VERTEBRAL ARTERY:  Normal antegrade flow

LEFT CAROTID ARTERY: Similar moderate intimal thickening and
bifurcation mixed echogenicity atherosclerosis. Negative for
stenosis, velocity elevation, turbulent flow. Degree of narrowing
also estimated less than 50% by ultrasound criteria.

LEFT VERTEBRAL ARTERY:  Normal antegrade flow
IMPRESSION: Moderate bilateral carotid atherosclerosis and intimal thickening.
Negative for significant stenosis. Degree of narrowing less than 50%
bilaterally by ultrasound criteria.

Patent antegrade vertebral flow bilaterally

## 2022-11-25 DIAGNOSIS — M48062 Spinal stenosis, lumbar region with neurogenic claudication: Secondary | ICD-10-CM | POA: Diagnosis not present

## 2022-12-29 DIAGNOSIS — M48062 Spinal stenosis, lumbar region with neurogenic claudication: Secondary | ICD-10-CM | POA: Diagnosis not present

## 2023-02-23 ENCOUNTER — Other Ambulatory Visit: Payer: Self-pay

## 2023-02-23 DIAGNOSIS — I779 Disorder of arteries and arterioles, unspecified: Secondary | ICD-10-CM

## 2023-02-23 DIAGNOSIS — R0989 Other specified symptoms and signs involving the circulatory and respiratory systems: Secondary | ICD-10-CM

## 2023-02-28 DIAGNOSIS — M48062 Spinal stenosis, lumbar region with neurogenic claudication: Secondary | ICD-10-CM | POA: Diagnosis not present

## 2023-03-26 DIAGNOSIS — Z1211 Encounter for screening for malignant neoplasm of colon: Secondary | ICD-10-CM | POA: Diagnosis not present

## 2023-03-30 DIAGNOSIS — Z87891 Personal history of nicotine dependence: Secondary | ICD-10-CM | POA: Diagnosis not present

## 2023-03-30 DIAGNOSIS — E7800 Pure hypercholesterolemia, unspecified: Secondary | ICD-10-CM | POA: Diagnosis not present

## 2023-03-30 DIAGNOSIS — Z299 Encounter for prophylactic measures, unspecified: Secondary | ICD-10-CM | POA: Diagnosis not present

## 2023-03-30 DIAGNOSIS — I2510 Atherosclerotic heart disease of native coronary artery without angina pectoris: Secondary | ICD-10-CM | POA: Diagnosis not present

## 2023-03-30 DIAGNOSIS — I10 Essential (primary) hypertension: Secondary | ICD-10-CM | POA: Diagnosis not present

## 2023-04-11 ENCOUNTER — Other Ambulatory Visit: Payer: Self-pay

## 2023-04-11 DIAGNOSIS — I779 Disorder of arteries and arterioles, unspecified: Secondary | ICD-10-CM

## 2023-04-25 ENCOUNTER — Ambulatory Visit (HOSPITAL_COMMUNITY)
Admission: RE | Admit: 2023-04-25 | Discharge: 2023-04-25 | Disposition: A | Discharge status: Home / Self Care | Payer: Medicare Other | Source: Ambulatory Visit | Attending: Physician Assistant | Admitting: Physician Assistant

## 2023-04-25 DIAGNOSIS — I771 Stricture of artery: Secondary | ICD-10-CM | POA: Diagnosis not present

## 2023-04-25 DIAGNOSIS — I6523 Occlusion and stenosis of bilateral carotid arteries: Secondary | ICD-10-CM | POA: Diagnosis not present

## 2023-04-25 DIAGNOSIS — I779 Disorder of arteries and arterioles, unspecified: Secondary | ICD-10-CM | POA: Diagnosis not present

## 2023-04-26 ENCOUNTER — Telehealth: Payer: Self-pay

## 2023-04-26 MED ORDER — ASPIRIN 81 MG PO TBEC: 81.0000 mg | DELAYED_RELEASE_TABLET | Freq: Every day | ORAL | 3 refills | Status: AC

## 2023-04-26 NOTE — Telephone Encounter (Signed)
-----   Message from Loa Socks, LPN sent at 11/26/1094 10:46 AM EDT ----- Ralph [name] pt   ----- Message ----- From: Dyann Kief, PA-C Sent: 04/26/2023  10:35 AM EDT To: [facility] Triage  Dopplers show plaque but no stenosis. Make sure he's back on ASA 81 mg if he's through with surgery. Continue statin. No changes. thanks

## 2023-04-26 NOTE — Telephone Encounter (Signed)
Echo results discussed with patient, he is back on ASA 81 mg qd   Copy to pcp

## 2023-05-15 ENCOUNTER — Other Ambulatory Visit: Payer: Self-pay | Admitting: Cardiology

## 2023-05-30 DIAGNOSIS — H524 Presbyopia: Secondary | ICD-10-CM | POA: Diagnosis not present

## 2023-05-30 DIAGNOSIS — H35373 Puckering of macula, bilateral: Secondary | ICD-10-CM | POA: Diagnosis not present

## 2023-05-30 NOTE — Progress Notes (Addendum)
Cardiology Office Note:  .   Date:  06/12/2023  ID:  Ralph [name], DOB [birth year], MRN [id number] PCP: Ignatius Specking, MD  [facility] Providers Cardiologist:  Nona Dell, MD    History of Present Illness: .   Ralph [name] is a 72 y.o. male  with history of CAD S/P CABG 10/2010, low risk NST 2018, HLD, right carotid bruit-1-39% carotid stenosis.  I last saw patient 03/2022 for preop clearance and NST was low risk.  Patient comes in for yearly f/u. Complains of vertigo if he looks left or looks up. Went to urgent care but doesn't want to take the meds. He plays golf and does everything he wants. Denies chest pain, dyspnea, palpitations, edema. He walks when he plays golf~ 3 miles twice a week, does yard work. No recent blood work but sees new PCP in August.   ROS:    Studies Reviewed: Marland Kitchen    EKG Interpretation Date/Time:  Monday June 12 2023 11:02:53 EDT Ventricular Rate:  69 PR Interval:  176 QRS Duration:  102 QT Interval:  414 QTC Calculation: 443 R Axis:   88  Text Interpretation: Normal sinus rhythm Cannot rule out Inferior infarct (cited on or before 10-Nov-2010) Confirmed by Jacolyn Reedy [id number]) on 06/12/2023 11:44:59 AM    Prior CV Studies: No results found for this or any previous visit from the past 3650 days.   VAS US CAROTID DUPLEX BILATERAL 05/14/2020  Narrative Carotid Arterial Duplex Study  Indications:  Right bruit. Risk Factors: Hypertension, hyperlipidemia, past history of smoking, coronary artery disease.  Performing Technologist: Jake Seats RDMS, RVT, RDCS   Examination Guidelines: A complete evaluation includes B-mode imaging, spectral Doppler, color Doppler, and power Doppler as needed of all accessible portions of each vessel. Bilateral testing is considered an integral part of a complete examination. Limited examinations for reoccurring indications may be performed as noted.   Right Carotid  Findings: +----------+--------+--------+--------+-------------------+---------+           PSV cm/sEDV cm/sStenosisPlaque Description Comments  +----------+--------+--------+--------+-------------------+---------+ CCA Prox  159     18                                           +----------+--------+--------+--------+-------------------+---------+ CCA Distal87      19                                           +----------+--------+--------+--------+-------------------+---------+ ICA Prox  89      20              calcific and smoothShadowing +----------+--------+--------+--------+-------------------+---------+ ICA Mid   84      25      1-39%                                +----------+--------+--------+--------+-------------------+---------+ ICA Distal90      25                                           +----------+--------+--------+--------+-------------------+---------+ ECA       127     10                                           +----------+--------+--------+--------+-------------------+---------+  +----------+--------+-------+----------------+-------------------+  PSV cm/sEDV cmsDescribe        Arm Pressure (mmHG) +----------+--------+-------+----------------+-------------------+ Subclavian204     0      Multiphasic, [id number]                 +----------+--------+-------+----------------+-------------------+  +---------+--------+--+--------+--+---------+ VertebralPSV cm/s73EDV cm/s26Antegrade +---------+--------+--+--------+--+---------+    Left Carotid Findings: +----------+--------+--------+--------+-------------------+--------+           PSV cm/sEDV cm/sStenosisPlaque Description Comments +----------+--------+--------+--------+-------------------+--------+ CCA Prox  74      12                                          +----------+--------+--------+--------+-------------------+--------+ CCA Mid                    <50%    calcific and smooth         +----------+--------+--------+--------+-------------------+--------+ CCA Distal116     15                                          +----------+--------+--------+--------+-------------------+--------+ ICA Prox  76      17              calcific and smooth         +----------+--------+--------+--------+-------------------+--------+ ICA Mid   91      31      1-39%                               +----------+--------+--------+--------+-------------------+--------+ ICA Distal106     36                                          +----------+--------+--------+--------+-------------------+--------+ ECA       136     9                                           +----------+--------+--------+--------+-------------------+--------+  +----------+--------+--------+----------------+-------------------+           PSV cm/sEDV cm/sDescribe        Arm Pressure (mmHG) +----------+--------+--------+----------------+-------------------+ Subclavian150     0       Multiphasic, [id number]                 +----------+--------+--------+----------------+-------------------+  +---------+--------+--+--------+--+---------+ VertebralPSV cm/s78EDV cm/s17Antegrade +---------+--------+--+--------+--+---------+      Summary: Right Carotid: Velocities in the right ICA are consistent with a 1-39% stenosis.  Left Carotid: Velocities in the left ICA are consistent with a 1-39% stenosis. Non-hemodynamically significant plaque <50% noted in the CCA.  Vertebrals:  Bilateral vertebral arteries demonstrate antegrade flow. Subclavians: Normal flow hemodynamics were seen in bilateral subclavian arteries.  *See table(s) above for measurements and observations.    Electronically signed by Lance Muss MD on 05/16/2020 at 8:03:55 AM.    Final       Risk Assessment/Calculations:             Physical Exam:    VS:  BP (!) 116/58   Pulse 69   Ht 5\' 8"  (1.727 m)   Wt 220 lb (99.8 kg)   SpO2 96%  BMI 33.45 kg/m    Wt Readings from Last 3 Encounters:  06/12/23 220 lb (99.8 kg)  06/06/22 216 lb 9.6 oz (98.2 kg)  05/25/22 216 lb 9.6 oz (98.2 kg)    GEN: Obese, in no acute distress NECK: No JVD; bilateral carotid bruits CARDIAC:  RRR, no murmurs, rubs, gallops RESPIRATORY:  Clear to auscultation without rales, wheezing or rhonchi  ABDOMEN: Soft, non-tender, non-distended EXTREMITIES:  No edema; No deformity   ASSESSMENT AND PLAN: .    CAD S/P CABG 10/2010, low risk NST 2018 and 03/2022-no angina Continue ASA, lipitor fish oil imdur metoprolol, lisinopril. Order labs   HLD-  labs by PCP 01/2022 LDL 76. Due for labs will order   bilateral carotid bruit-dopplers 04/25/23 no sig stenois but plaque   Prediabetes A1C 6.2 labs 01/2022.Loves sweets and chocolate.  Diet and exercise discussed. Recheck labs  HTN controlled on above meds        Dispo: f/u in 1 yr.  Signed, Jacolyn Reedy, PA-C

## 2023-06-01 DIAGNOSIS — M25561 Pain in right knee: Secondary | ICD-10-CM | POA: Diagnosis not present

## 2023-06-01 DIAGNOSIS — M79674 Pain in right toe(s): Secondary | ICD-10-CM | POA: Diagnosis not present

## 2023-06-01 DIAGNOSIS — M48062 Spinal stenosis, lumbar region with neurogenic claudication: Secondary | ICD-10-CM | POA: Diagnosis not present

## 2023-06-12 ENCOUNTER — Encounter: Payer: Self-pay | Admitting: Physician Assistant

## 2023-06-12 ENCOUNTER — Ambulatory Visit: Payer: Medicare Other | Attending: Physician Assistant | Admitting: Physician Assistant

## 2023-06-12 VITALS — BP 116/58 | HR 69 | Ht 68.0 in | Wt 220.0 lb

## 2023-06-12 DIAGNOSIS — I779 Disorder of arteries and arterioles, unspecified: Secondary | ICD-10-CM | POA: Diagnosis not present

## 2023-06-12 DIAGNOSIS — I25119 Atherosclerotic heart disease of native coronary artery with unspecified angina pectoris: Secondary | ICD-10-CM

## 2023-06-12 DIAGNOSIS — E782 Mixed hyperlipidemia: Secondary | ICD-10-CM | POA: Diagnosis not present

## 2023-06-12 DIAGNOSIS — I10 Essential (primary) hypertension: Secondary | ICD-10-CM

## 2023-06-12 DIAGNOSIS — R7303 Prediabetes: Secondary | ICD-10-CM

## 2023-06-12 NOTE — Patient Instructions (Signed)
Medication Instructions:  Your physician recommends that you continue on your current medications as directed. Please refer to the Current Medication list given to you today.   Labwork: None today  Testing/Procedures: None today  Follow-Up: 1 year Dr.McDowell  Any Other Special Instructions Will Be Listed Below (If Applicable).  If you need a refill on your cardiac medications before your next appointment, please call your pharmacy.

## 2023-06-19 ENCOUNTER — Other Ambulatory Visit: Payer: Self-pay | Admitting: Cardiology

## 2023-06-28 ENCOUNTER — Other Ambulatory Visit: Payer: Self-pay | Admitting: Cardiology

## 2023-07-06 DIAGNOSIS — I10 Essential (primary) hypertension: Secondary | ICD-10-CM | POA: Diagnosis not present

## 2023-07-06 DIAGNOSIS — Z299 Encounter for prophylactic measures, unspecified: Secondary | ICD-10-CM | POA: Diagnosis not present

## 2023-07-06 DIAGNOSIS — Z0000 Encounter for general adult medical examination without abnormal findings: Secondary | ICD-10-CM | POA: Diagnosis not present

## 2023-07-06 DIAGNOSIS — Z87891 Personal history of nicotine dependence: Secondary | ICD-10-CM | POA: Diagnosis not present

## 2023-07-06 DIAGNOSIS — I2510 Atherosclerotic heart disease of native coronary artery without angina pectoris: Secondary | ICD-10-CM | POA: Diagnosis not present

## 2023-07-06 DIAGNOSIS — Z7189 Other specified counseling: Secondary | ICD-10-CM | POA: Diagnosis not present

## 2023-07-11 DIAGNOSIS — M9902 Segmental and somatic dysfunction of thoracic region: Secondary | ICD-10-CM | POA: Diagnosis not present

## 2023-07-11 DIAGNOSIS — S134XXA Sprain of ligaments of cervical spine, initial encounter: Secondary | ICD-10-CM | POA: Diagnosis not present

## 2023-07-11 DIAGNOSIS — M9903 Segmental and somatic dysfunction of lumbar region: Secondary | ICD-10-CM | POA: Diagnosis not present

## 2023-07-11 DIAGNOSIS — M9901 Segmental and somatic dysfunction of cervical region: Secondary | ICD-10-CM | POA: Diagnosis not present

## 2023-07-11 DIAGNOSIS — S233XXA Sprain of ligaments of thoracic spine, initial encounter: Secondary | ICD-10-CM | POA: Diagnosis not present

## 2023-07-11 DIAGNOSIS — S338XXA Sprain of other parts of lumbar spine and pelvis, initial encounter: Secondary | ICD-10-CM | POA: Diagnosis not present

## 2023-07-13 DIAGNOSIS — S134XXA Sprain of ligaments of cervical spine, initial encounter: Secondary | ICD-10-CM | POA: Diagnosis not present

## 2023-07-13 DIAGNOSIS — M9903 Segmental and somatic dysfunction of lumbar region: Secondary | ICD-10-CM | POA: Diagnosis not present

## 2023-07-13 DIAGNOSIS — S338XXA Sprain of other parts of lumbar spine and pelvis, initial encounter: Secondary | ICD-10-CM | POA: Diagnosis not present

## 2023-07-13 DIAGNOSIS — M9901 Segmental and somatic dysfunction of cervical region: Secondary | ICD-10-CM | POA: Diagnosis not present

## 2023-07-13 DIAGNOSIS — S233XXA Sprain of ligaments of thoracic spine, initial encounter: Secondary | ICD-10-CM | POA: Diagnosis not present

## 2023-07-13 DIAGNOSIS — M9902 Segmental and somatic dysfunction of thoracic region: Secondary | ICD-10-CM | POA: Diagnosis not present

## 2023-07-17 DIAGNOSIS — M9903 Segmental and somatic dysfunction of lumbar region: Secondary | ICD-10-CM | POA: Diagnosis not present

## 2023-07-17 DIAGNOSIS — S233XXA Sprain of ligaments of thoracic spine, initial encounter: Secondary | ICD-10-CM | POA: Diagnosis not present

## 2023-07-17 DIAGNOSIS — S338XXA Sprain of other parts of lumbar spine and pelvis, initial encounter: Secondary | ICD-10-CM | POA: Diagnosis not present

## 2023-07-17 DIAGNOSIS — S134XXA Sprain of ligaments of cervical spine, initial encounter: Secondary | ICD-10-CM | POA: Diagnosis not present

## 2023-07-17 DIAGNOSIS — M9901 Segmental and somatic dysfunction of cervical region: Secondary | ICD-10-CM | POA: Diagnosis not present

## 2023-07-17 DIAGNOSIS — M9902 Segmental and somatic dysfunction of thoracic region: Secondary | ICD-10-CM | POA: Diagnosis not present

## 2023-07-20 DIAGNOSIS — S134XXA Sprain of ligaments of cervical spine, initial encounter: Secondary | ICD-10-CM | POA: Diagnosis not present

## 2023-07-20 DIAGNOSIS — M9902 Segmental and somatic dysfunction of thoracic region: Secondary | ICD-10-CM | POA: Diagnosis not present

## 2023-07-20 DIAGNOSIS — M9903 Segmental and somatic dysfunction of lumbar region: Secondary | ICD-10-CM | POA: Diagnosis not present

## 2023-07-20 DIAGNOSIS — S233XXA Sprain of ligaments of thoracic spine, initial encounter: Secondary | ICD-10-CM | POA: Diagnosis not present

## 2023-07-20 DIAGNOSIS — S338XXA Sprain of other parts of lumbar spine and pelvis, initial encounter: Secondary | ICD-10-CM | POA: Diagnosis not present

## 2023-07-20 DIAGNOSIS — M9901 Segmental and somatic dysfunction of cervical region: Secondary | ICD-10-CM | POA: Diagnosis not present

## 2023-07-25 DIAGNOSIS — M9901 Segmental and somatic dysfunction of cervical region: Secondary | ICD-10-CM | POA: Diagnosis not present

## 2023-07-25 DIAGNOSIS — S134XXA Sprain of ligaments of cervical spine, initial encounter: Secondary | ICD-10-CM | POA: Diagnosis not present

## 2023-07-25 DIAGNOSIS — M9903 Segmental and somatic dysfunction of lumbar region: Secondary | ICD-10-CM | POA: Diagnosis not present

## 2023-07-25 DIAGNOSIS — M9902 Segmental and somatic dysfunction of thoracic region: Secondary | ICD-10-CM | POA: Diagnosis not present

## 2023-07-25 DIAGNOSIS — S233XXA Sprain of ligaments of thoracic spine, initial encounter: Secondary | ICD-10-CM | POA: Diagnosis not present

## 2023-08-16 ENCOUNTER — Other Ambulatory Visit: Payer: Self-pay | Admitting: Cardiology

## 2023-08-24 DIAGNOSIS — E7800 Pure hypercholesterolemia, unspecified: Secondary | ICD-10-CM | POA: Diagnosis not present

## 2023-08-24 DIAGNOSIS — Z79899 Other long term (current) drug therapy: Secondary | ICD-10-CM | POA: Diagnosis not present

## 2023-08-24 DIAGNOSIS — Z0000 Encounter for general adult medical examination without abnormal findings: Secondary | ICD-10-CM | POA: Diagnosis not present

## 2023-08-28 DIAGNOSIS — Z299 Encounter for prophylactic measures, unspecified: Secondary | ICD-10-CM | POA: Diagnosis not present

## 2023-08-28 DIAGNOSIS — I2510 Atherosclerotic heart disease of native coronary artery without angina pectoris: Secondary | ICD-10-CM | POA: Diagnosis not present

## 2023-08-28 DIAGNOSIS — Z0000 Encounter for general adult medical examination without abnormal findings: Secondary | ICD-10-CM | POA: Diagnosis not present

## 2023-08-28 DIAGNOSIS — R52 Pain, unspecified: Secondary | ICD-10-CM | POA: Diagnosis not present

## 2023-08-28 DIAGNOSIS — I10 Essential (primary) hypertension: Secondary | ICD-10-CM | POA: Diagnosis not present

## 2023-09-05 DIAGNOSIS — M48062 Spinal stenosis, lumbar region with neurogenic claudication: Secondary | ICD-10-CM | POA: Diagnosis not present

## 2023-10-09 DIAGNOSIS — Z299 Encounter for prophylactic measures, unspecified: Secondary | ICD-10-CM | POA: Diagnosis not present

## 2023-10-09 DIAGNOSIS — I10 Essential (primary) hypertension: Secondary | ICD-10-CM | POA: Diagnosis not present

## 2023-10-09 DIAGNOSIS — S81819A Laceration without foreign body, unspecified lower leg, initial encounter: Secondary | ICD-10-CM | POA: Diagnosis not present

## 2023-10-09 DIAGNOSIS — R252 Cramp and spasm: Secondary | ICD-10-CM | POA: Diagnosis not present

## 2023-10-09 DIAGNOSIS — R52 Pain, unspecified: Secondary | ICD-10-CM | POA: Diagnosis not present

## 2023-10-16 DIAGNOSIS — R238 Other skin changes: Secondary | ICD-10-CM | POA: Diagnosis not present

## 2023-10-16 DIAGNOSIS — I10 Essential (primary) hypertension: Secondary | ICD-10-CM | POA: Diagnosis not present

## 2023-10-16 DIAGNOSIS — Z299 Encounter for prophylactic measures, unspecified: Secondary | ICD-10-CM | POA: Diagnosis not present

## 2023-10-16 DIAGNOSIS — S81819A Laceration without foreign body, unspecified lower leg, initial encounter: Secondary | ICD-10-CM | POA: Diagnosis not present

## 2023-11-06 DIAGNOSIS — I10 Essential (primary) hypertension: Secondary | ICD-10-CM | POA: Diagnosis not present

## 2023-11-06 DIAGNOSIS — S81819A Laceration without foreign body, unspecified lower leg, initial encounter: Secondary | ICD-10-CM | POA: Diagnosis not present

## 2023-11-06 DIAGNOSIS — R238 Other skin changes: Secondary | ICD-10-CM | POA: Diagnosis not present

## 2023-11-06 DIAGNOSIS — Z299 Encounter for prophylactic measures, unspecified: Secondary | ICD-10-CM | POA: Diagnosis not present

## 2023-11-13 ENCOUNTER — Ambulatory Visit: Payer: Medicare Other | Attending: Nurse Practitioner | Admitting: Nurse Practitioner

## 2023-11-13 ENCOUNTER — Encounter: Payer: Self-pay | Admitting: Nurse Practitioner

## 2023-11-13 VITALS — BP 114/68 | HR 62 | Ht 68.0 in | Wt 213.2 lb

## 2023-11-13 DIAGNOSIS — I779 Disorder of arteries and arterioles, unspecified: Secondary | ICD-10-CM

## 2023-11-13 DIAGNOSIS — I2510 Atherosclerotic heart disease of native coronary artery without angina pectoris: Secondary | ICD-10-CM

## 2023-11-13 DIAGNOSIS — E785 Hyperlipidemia, unspecified: Secondary | ICD-10-CM

## 2023-11-13 DIAGNOSIS — I10 Essential (primary) hypertension: Secondary | ICD-10-CM | POA: Diagnosis not present

## 2023-11-13 DIAGNOSIS — R42 Dizziness and giddiness: Secondary | ICD-10-CM

## 2023-11-13 MED ORDER — ISOSORBIDE MONONITRATE ER 30 MG PO TB24: 15.0000 mg | ORAL_TABLET | Freq: Every day | ORAL | 1 refills | Status: DC

## 2023-11-13 NOTE — Progress Notes (Signed)
Cardiology Office Note:  .   Date:  11/13/2023  ID:  Kerrin Champagne, DOB 12/12/1950, MRN [id number] PCP: Ignatius Specking, MD  [facility] Providers Cardiologist:  Nona Dell, MD    History of Present Illness: .   Ralph [name] is a 72 y.o. male with a PMH of CAD, s/p CABG in 2011, HLD, carotid artery stenosis, HTN, vertigo, and prediabetes, who presents today for requested follow-up.   Last seen by Jacolyn Reedy, PA-C on June 12, 2023. Admitted to vertigo, was overall doing well from a cardiac perspective. Was very active with walking, doing yard work, and [employer].   Today he presents for scheduled follow-up. His chief concern today is dizziness noted when playing golf, says this occurs when he stands and gets up too quickly.  Has episodes of "things fading out" and he denies any syncope.  Says he notices easy bruising, has a healing abrasion to left lateral lower leg. Denies any chest pain, shortness of breath, palpitations, syncope, orthopnea, PND, swelling or significant weight changes, acute bleeding, or claudication.  ROS: Negative. See HPI.   Studies Reviewed: .    Carotid duplex 04/2023:  IMPRESSION: Color duplex indicates moderate heterogeneous and calcified plaque, with no hemodynamically significant stenosis by duplex criteria in the extracranial cerebrovascular circulation.  Lexiscan 03/2022:    The study is normal. There are no perfusion defects.  The study is low risk.   No ST deviation was noted.   LV perfusion is normal.   Left ventricular function is normal. Nuclear stress EF: 68 %. The left ventricular ejection fraction is hyperdynamic (>65%). End diastolic cavity size is normal.  Echocardiogram 09/2014: Study Conclusions   - Left ventricle: The cavity size was normal. Wall thickness was    normal. Systolic function was normal. The estimated ejection    fraction was in the range of 60% to 65%. Wall motion was normal;    there were no regional wall  motion abnormalities. Doppler    parameters are consistent with abnormal left ventricular    relaxation (grade 1 diastolic dysfunction).  - Aortic valve: Mildly calcified annulus. Probably trileaflet.    There was no significant regurgitation.  - Mitral valve: Calcified annulus. Mildly thickened leaflets .    There was trivial regurgitation.  - Left atrium: The atrium was mildly dilated.  - Right atrium: The atrium was mildly dilated.  - Tricuspid valve: There was trivial regurgitation. Peak RV-RA    gradient (S): 30 mm Hg.  - Inferior vena cava: Not visualized. Unable to estimate CVP.  - Pericardium, extracardiac: There was no pericardial effusion.   Physical Exam:   VS:  BP 114/68   Pulse 62   Ht 5\' 8"  (1.727 m)   Wt 213 lb 3.2 oz (96.7 kg)   SpO2 97%   BMI 32.42 kg/m    Wt Readings from Last 3 Encounters:  11/13/23 213 lb 3.2 oz (96.7 kg)  06/12/23 220 lb (99.8 kg)  06/06/22 216 lb 9.6 oz (98.2 kg)    Orthostatics:  Lying: 131/82, 60 bpm Sitting: 112/73, 62 bpm Standing: 131/81, 70 bpm Standing x 3 minutes: 127/81, 60 bpm  GEN: Well nourished, well developed in no acute distress NECK: No JVD; No carotid bruits CARDIAC: S1/S2, RRR, no murmurs, rubs, gallops RESPIRATORY:  Clear to auscultation without rales, wheezing or rhonchi  EXTREMITIES:  No edema; No deformity, gauze pad noted to left lateral lower extremity-clean/dry/intact dressing.  ASSESSMENT AND PLAN: .  Orthostatic dizziness Has been ongoing recently.  Orthostatics revealed component of orthostasis as noted above.  Will stop lisinopril and reduce isosorbide mononitrate to 15 mg daily.  Recent lab work reviewed and was overall unremarkable.  Discussed conservative measures.  Care and ED precautions discussed.  Continue follow-up with PCP.  CAD, s/p CABG Stable with no anginal symptoms. No indication for ischemic evaluation.  Medication changes as noted above, continue rest of medication regimen. Heart healthy  diet and regular cardiovascular exercise encouraged.   HLD, carotid artery stenosis Last LDL 86 in October 2024.  LDL is not quite at goal.  Will address increasing atorvastatin at next office visit if his orthostatic dizziness has improved.  Continue current medication regimen.   HTN Blood pressure is stable today-see orthostatics listed above.  Stopping lisinopril and reducing Imdur to 15 mg daily to help improve his symptoms. Discussed to monitor BP at home at least 2 hours after medications and sitting for 5-10 minutes. Heart healthy diet and regular cardiovascular exercise encouraged.      Dispo: Care and ED precautions discussed.  Follow-up with me/APP in 6 weeks or sooner if anything changes.  Signed, Sharlene Dory, NP

## 2023-11-13 NOTE — Patient Instructions (Addendum)
Medication Instructions:  Your physician has recommended you make the following change in your medication:  Please stop Lisinopril Please reduce IMDUR to 15 Mg daily   Labwork: None   Testing/Procedures: None   Follow-Up: Your physician recommends that you schedule a follow-up appointment in: 6 weeks   Any Other Special Instructions Will Be Listed Below (If Applicable).  If you need a refill on your cardiac medications before your next appointment, please call your pharmacy.

## 2023-12-07 DIAGNOSIS — M48062 Spinal stenosis, lumbar region with neurogenic claudication: Secondary | ICD-10-CM | POA: Diagnosis not present

## 2023-12-25 NOTE — Progress Notes (Deleted)
 Cardiology Office Note:  .   Date:  12/25/2023  ID:  Ralph [name], DOB Jul 15, 1951, MRN [id number] PCP: Rosamond Leta NOVAK, MD  [facility] Providers Cardiologist:  Jayson Sierras, MD    History of Present Illness: .   Ralph [name] is a 73 y.o. male with a PMH of CAD, s/p CABG in 2011, HLD, carotid artery stenosis, HTN, vertigo, and prediabetes, who presents today for requested follow-up.   Last seen by Ralph Pavy, PA-C on June 12, 2023. Admitted to vertigo, was overall doing well from a cardiac perspective. Was very active with walking, doing yard work, and naval architect.   Today he presents for scheduled follow-up. His chief concern today is dizziness noted when playing golf, says this occurs when he stands and gets up too quickly.  Has episodes of things fading out and he denies any syncope.  Says he notices easy bruising, has a healing abrasion to left lateral lower leg. Denies any chest pain, shortness of breath, palpitations, syncope, orthopnea, PND, swelling or significant weight changes, acute bleeding, or claudication.  ROS: Negative. See HPI.   Studies Reviewed: .    Carotid duplex 04/2023:  IMPRESSION: Color duplex indicates moderate heterogeneous and calcified plaque, with no hemodynamically significant stenosis by duplex criteria in the extracranial cerebrovascular circulation.  Lexiscan  03/2022:    The study is normal. There are no perfusion defects.  The study is low risk.   No ST deviation was noted.   LV perfusion is normal.   Left ventricular function is normal. Nuclear stress EF: 68 %. The left ventricular ejection fraction is hyperdynamic (>65%). End diastolic cavity size is normal.  Echocardiogram 09/2014: Study Conclusions   - Left ventricle: The cavity size was normal. Wall thickness was    normal. Systolic function was normal. The estimated ejection    fraction was in the range of 60% to 65%. Wall motion was normal;    there were no regional wall  motion abnormalities. Doppler    parameters are consistent with abnormal left ventricular    relaxation (grade 1 diastolic dysfunction).  - Aortic valve: Mildly calcified annulus. Probably trileaflet.    There was no significant regurgitation.  - Mitral valve: Calcified annulus. Mildly thickened leaflets .    There was trivial regurgitation.  - Left atrium: The atrium was mildly dilated.  - Right atrium: The atrium was mildly dilated.  - Tricuspid valve: There was trivial regurgitation. Peak RV-RA    gradient (S): 30 mm Hg.  - Inferior vena cava: Not visualized. Unable to estimate CVP.  - Pericardium, extracardiac: There was no pericardial effusion.   Physical Exam:   VS:  There were no vitals taken for this visit.   Wt Readings from Last 3 Encounters:  11/13/23 213 lb 3.2 oz (96.7 kg)  06/12/23 220 lb (99.8 kg)  06/06/22 216 lb 9.6 oz (98.2 kg)    Orthostatics:  Lying: 131/82, 60 bpm Sitting: 112/73, 62 bpm Standing: 131/81, 70 bpm Standing x 3 minutes: 127/81, 60 bpm  GEN: Well nourished, well developed in no acute distress NECK: No JVD; No carotid bruits CARDIAC: S1/S2, RRR, no murmurs, rubs, gallops RESPIRATORY:  Clear to auscultation without rales, wheezing or rhonchi  EXTREMITIES:  No edema; No deformity, gauze pad noted to left lateral lower extremity-clean/dry/intact dressing.  ASSESSMENT AND PLAN: .    Orthostatic dizziness Has been ongoing recently.  Orthostatics revealed component of orthostasis as noted above.  Will stop lisinopril  and reduce isosorbide  mononitrate  to 15 mg daily.  Recent lab work reviewed and was overall unremarkable.  Discussed conservative measures.  Care and ED precautions discussed.  Continue follow-up with PCP.  CAD, s/p CABG Stable with no anginal symptoms. No indication for ischemic evaluation.  Medication changes as noted above, continue rest of medication regimen. Heart healthy diet and regular cardiovascular exercise encouraged.   HLD,  carotid artery stenosis Last LDL 86 in October 2024.  LDL is not quite at goal.  Will address increasing atorvastatin  at next office visit if his orthostatic dizziness has improved.  Continue current medication regimen.   HTN Blood pressure is stable today-see orthostatics listed above.  Stopping lisinopril  and reducing Imdur  to 15 mg daily to help improve his symptoms. Discussed to monitor BP at home at least 2 hours after medications and sitting for 5-10 minutes. Heart healthy diet and regular cardiovascular exercise encouraged.      Dispo: Care and ED precautions discussed.  Follow-up with me/APP in 6 weeks or sooner if anything changes.  Signed, [name], NP

## 2023-12-26 ENCOUNTER — Ambulatory Visit: Payer: Medicare Other | Admitting: Nurse Practitioner

## 2023-12-29 DIAGNOSIS — R0981 Nasal congestion: Secondary | ICD-10-CM | POA: Diagnosis not present

## 2023-12-29 DIAGNOSIS — J069 Acute upper respiratory infection, unspecified: Secondary | ICD-10-CM | POA: Diagnosis not present

## 2023-12-29 DIAGNOSIS — Z299 Encounter for prophylactic measures, unspecified: Secondary | ICD-10-CM | POA: Diagnosis not present

## 2024-02-06 ENCOUNTER — Encounter: Payer: Self-pay | Admitting: Nurse Practitioner

## 2024-02-06 ENCOUNTER — Ambulatory Visit: Payer: Medicare Other | Attending: Nurse Practitioner | Admitting: Nurse Practitioner

## 2024-02-06 VITALS — BP 126/72 | HR 62 | Ht 68.0 in | Wt 214.2 lb

## 2024-02-06 DIAGNOSIS — I10 Essential (primary) hypertension: Secondary | ICD-10-CM

## 2024-02-06 DIAGNOSIS — E785 Hyperlipidemia, unspecified: Secondary | ICD-10-CM | POA: Diagnosis not present

## 2024-02-06 DIAGNOSIS — I2510 Atherosclerotic heart disease of native coronary artery without angina pectoris: Secondary | ICD-10-CM | POA: Diagnosis not present

## 2024-02-06 DIAGNOSIS — I25119 Atherosclerotic heart disease of native coronary artery with unspecified angina pectoris: Secondary | ICD-10-CM

## 2024-02-06 DIAGNOSIS — E782 Mixed hyperlipidemia: Secondary | ICD-10-CM

## 2024-02-06 MED ORDER — ATORVASTATIN CALCIUM 40 MG PO TABS: 80.0000 mg | ORAL_TABLET | Freq: Every day | ORAL | 1 refills | Status: DC

## 2024-02-06 NOTE — Patient Instructions (Addendum)
 Medication Instructions:  Your physician has recommended you make the following change in your medication:  Please Increase Atorvastatin to 80 Mg daily   Labwork: In 2 months at [facility]   Testing/Procedures: None   Follow-Up: Your physician recommends that you schedule a follow-up appointment in: 6 months   Any Other Special Instructions Will Be Listed Below (If Applicable).  If you need a refill on your cardiac medications before your next appointment, please call your pharmacy.

## 2024-02-06 NOTE — Progress Notes (Unsigned)
 Cardiology Office Note:  .   Date: 02/06/2024 ID:  Kerrin Champagne, DOB [birth year], MRN [id number] PCP: Ignatius Specking, MD  [facility] Providers Cardiologist:  Nona Dell, MD    History of Present Illness: .   Ralph [name] is a 73 y.o. male with a PMH of CAD, s/p CABG in 2011, HLD, carotid artery stenosis, HTN, vertigo, and prediabetes, who presents today for requested follow-up.   Last seen by Jacolyn Reedy, PA-C on June 12, 2023. Admitted to vertigo, was overall doing well from a cardiac perspective. Was very active with walking, doing yard work, and [employer] architect.   11/13/2023 - today he presents for scheduled follow-up. His chief concern today is dizziness noted when playing golf, says this occurs when he stands and gets up too quickly.  Has episodes of "things fading out" and he denies any syncope.  Says he notices easy bruising, has a healing abrasion to left lateral lower leg. Denies any chest pain, shortness of breath, palpitations, syncope, orthopnea, PND, swelling or significant weight changes, acute bleeding, or claudication.  02/06/2024-today he presents for follow-up.  Does admit to some issues with sciatica, he is receiving treatment for this.  Denies any recurring dizziness issues.  Said medication adjustments helped after last office visit. Doing well. Denies any chest pain, shortness of breath, palpitations, syncope, presyncope, dizziness, orthopnea, PND, swelling or significant weight changes, acute bleeding, or claudication.   ROS: Negative. See HPI.   Studies Reviewed: .    Carotid duplex 04/2023:  IMPRESSION: Color duplex indicates moderate heterogeneous and calcified plaque, with no hemodynamically significant stenosis by duplex criteria in the extracranial cerebrovascular circulation.  Lexiscan 03/2022:    The study is normal. There are no perfusion defects.  The study is low risk.   No ST deviation was noted.   LV perfusion is normal.   Left  ventricular function is normal. Nuclear stress EF: 68 %. The left ventricular ejection fraction is hyperdynamic (>65%). End diastolic cavity size is normal.  Echocardiogram 09/2014: Study Conclusions   - Left ventricle: The cavity size was normal. Wall thickness was    normal. Systolic function was normal. The estimated ejection    fraction was in the range of 60% to 65%. Wall motion was normal;    there were no regional wall motion abnormalities. Doppler    parameters are consistent with abnormal left ventricular    relaxation (grade 1 diastolic dysfunction).  - Aortic valve: Mildly calcified annulus. Probably trileaflet.    There was no significant regurgitation.  - Mitral valve: Calcified annulus. Mildly thickened leaflets .    There was trivial regurgitation.  - Left atrium: The atrium was mildly dilated.  - Right atrium: The atrium was mildly dilated.  - Tricuspid valve: There was trivial regurgitation. Peak RV-RA    gradient (S): 30 mm Hg.  - Inferior vena cava: Not visualized. Unable to estimate CVP.  - Pericardium, extracardiac: There was no pericardial effusion.   Physical Exam:   VS:  BP 126/72   Pulse 62   Ht 5\' 8"  (1.727 m)   Wt 214 lb 3.2 oz (97.2 kg)   SpO2 98%   BMI 32.57 kg/m    Wt Readings from Last 3 Encounters:  02/06/24 214 lb 3.2 oz (97.2 kg)  11/13/23 213 lb 3.2 oz (96.7 kg)  06/12/23 220 lb (99.8 kg)    GEN: Well nourished, well developed in no acute distress NECK: No JVD; No carotid bruits CARDIAC: S1/S2,  RRR, no murmurs, rubs, gallops RESPIRATORY:  Clear to auscultation without rales, wheezing or rhonchi  EXTREMITIES:  No edema; No deformity  ASSESSMENT AND PLAN: .    CAD, s/p CABG Stable with no anginal symptoms. No indication for ischemic evaluation.   Continue aspirin, Imdur, Lopressor, will increase atorvastatin to 80 mg daily to lower LDL-see below.  Will obtain FLP/CMET and 2 months. Heart healthy diet and regular cardiovascular exercise  encouraged.   HLD, carotid artery stenosis Last LDL 86 in October 2024.  LDL is not quite at goal.  Will increase atorvastatin 80 mg daily.  Will obtain FLP/CMET in 2 months.  Heart healthy diet and regular cardiovascular exercise encouraged.   HTN Blood pressure is stable today.  No medication changes at this time.  GDMT limited due to orthostatic dizziness in the past.  Discussed to monitor BP at home at least 2 hours after medications and sitting for 5-10 minutes. Heart healthy diet and regular cardiovascular exercise encouraged.      Dispo: Care and ED precautions discussed.  Follow-up with Dr. Diona Browner or APP in 6 months or sooner if anything changes.  Signed, Sharlene Dory, NP

## 2024-02-27 DIAGNOSIS — Z299 Encounter for prophylactic measures, unspecified: Secondary | ICD-10-CM | POA: Diagnosis not present

## 2024-02-27 DIAGNOSIS — I10 Essential (primary) hypertension: Secondary | ICD-10-CM | POA: Diagnosis not present

## 2024-02-27 DIAGNOSIS — M79604 Pain in right leg: Secondary | ICD-10-CM | POA: Diagnosis not present

## 2024-02-27 DIAGNOSIS — R52 Pain, unspecified: Secondary | ICD-10-CM | POA: Diagnosis not present

## 2024-03-12 DIAGNOSIS — M48062 Spinal stenosis, lumbar region with neurogenic claudication: Secondary | ICD-10-CM | POA: Diagnosis not present

## 2024-03-20 DIAGNOSIS — M4316 Spondylolisthesis, lumbar region: Secondary | ICD-10-CM | POA: Diagnosis not present

## 2024-03-20 DIAGNOSIS — M48062 Spinal stenosis, lumbar region with neurogenic claudication: Secondary | ICD-10-CM | POA: Diagnosis not present

## 2024-03-22 DIAGNOSIS — Z299 Encounter for prophylactic measures, unspecified: Secondary | ICD-10-CM | POA: Diagnosis not present

## 2024-03-22 DIAGNOSIS — Z01818 Encounter for other preprocedural examination: Secondary | ICD-10-CM | POA: Diagnosis not present

## 2024-03-22 DIAGNOSIS — I10 Essential (primary) hypertension: Secondary | ICD-10-CM | POA: Diagnosis not present

## 2024-03-22 DIAGNOSIS — I25118 Atherosclerotic heart disease of native coronary artery with other forms of angina pectoris: Secondary | ICD-10-CM | POA: Diagnosis not present

## 2024-03-24 ENCOUNTER — Other Ambulatory Visit: Payer: Self-pay | Admitting: Nurse Practitioner

## 2024-03-25 ENCOUNTER — Telehealth: Payer: Self-pay | Admitting: Nurse Practitioner

## 2024-03-25 ENCOUNTER — Other Ambulatory Visit: Payer: Self-pay | Admitting: Neurosurgery

## 2024-03-25 NOTE — Telephone Encounter (Signed)
   Pre-operative Risk Assessment    Patient Name: Ralph [name]  DOB: [birth year] MRN: [id number]      Request for Surgical Clearance    Procedure:   Lumbar Fusion  Date of Surgery:  Clearance TBD                                 Surgeon:  Dr. Augusto Blonder Surgeon's Group or Practice Name:  [facility] Phone number:  [phone] Fax number:  [phone]   Type of Clearance Requested:   - Medical    Type of Anesthesia:  General    Additional requests/questions:    SignedEsteban Heinrich   03/25/2024, 4:37 PM

## 2024-03-26 NOTE — Telephone Encounter (Signed)
   Primary Cardiologist: Teddie Favre, MD  Chart reviewed as part of pre-operative protocol coverage. Given past medical history and time since last visit, based on ACC/AHA guidelines, MOKSH SIGGINS would be at acceptable risk for the planned procedure without further cardiovascular testing.   Patient was advised that if he develops new symptoms prior to surgery to contact our office to arrange a follow-up appointment.  He verbalized understanding.  I will route this recommendation to the requesting party via Epic fax function and remove from pre-op pool.  Please call with questions.  Gerldine Koch, NP-C  03/26/2024, 10:59 AM [address], [address] [place], [place] [address] Office [phone] Fax [phone]

## 2024-03-27 ENCOUNTER — Encounter (HOSPITAL_COMMUNITY): Payer: Self-pay

## 2024-03-27 NOTE — Progress Notes (Signed)
 Surgical Instructions   Your procedure is scheduled on Tuesday, May 20th. Report to [facility] Main Entrance "A" at 5:30 A.M., then check in with the Admitting office. Any questions or running late day of surgery: call [phone]  Questions prior to your surgery date: call [phone], Monday-Friday, 8am-4pm. If you experience any cold or flu symptoms such as cough, fever, chills, shortness of breath, etc. between now and your scheduled surgery, please notify us  at the above number.     Remember:  Do not eat or drink after midnight the night before your surgery  Take these medicines the morning of surgery with A SIP OF WATER   atorvastatin  (LIPITOR)  isosorbide  mononitrate (IMDUR )  metoprolol  tartrate (LOPRESSOR )   Follow your surgeon's instructions on when to stop Asprin.  If no instructions were given by your surgeon then you will need to call the office to get those instructions.     One week prior to surgery, STOP taking any Aleve, Naproxen, Motrin, Goody's, BC's, all herbal medications, fish oil, and non-prescription vitamins. This includes your ibuprofen (ADVIL).                     Do NOT Smoke (Tobacco/Vaping) for 24 hours prior to your procedure.  If you use a CPAP at night, you may bring your mask/headgear for your overnight stay.   You will be asked to remove any contacts, glasses, piercing's, hearing aid's, dentures/partials prior to surgery. Please bring cases for these items if needed.    Patients discharged the day of surgery will not be allowed to drive home, and someone needs to stay with them for 24 hours.  SURGICAL WAITING ROOM VISITATION Patients may have no more than 2 support people in the waiting area - these visitors may rotate.   Pre-op nurse will coordinate an appropriate time for 1 ADULT support person, who may not rotate, to accompany patient in pre-op.  Children under the age of 29 must have an adult with them who is not the patient and must remain  in the main waiting area with an adult.  If the patient needs to stay at the hospital during part of their recovery, the visitor guidelines for inpatient rooms apply.  Please refer to the [facility] website for the visitor guidelines for any additional information.   If you received a COVID test during your pre-op visit  it is requested that you wear a mask when out in public, stay away from anyone that may not be feeling well and notify your surgeon if you develop symptoms. If you have been in contact with anyone that has tested positive in the last 10 days please notify you surgeon.      Pre-operative 5 CHG Bathing Instructions   You can play a key role in reducing the risk of infection after surgery. Your skin needs to be as free of germs as possible. You can reduce the number of germs on your skin by washing with CHG (chlorhexidine  gluconate) soap before surgery. CHG is an antiseptic soap that kills germs and continues to kill germs even after washing.   DO NOT use if you have an allergy to chlorhexidine /CHG or antibacterial soaps. If your skin becomes reddened or irritated, stop using the CHG and notify one of our RNs at [phone].   Please shower with the CHG soap starting 4 days before surgery using the following schedule:     Please keep in mind the following:  DO NOT shave, including  legs and underarms, starting the day of your first shower.   You may shave your face at any point before/day of surgery.  Place clean sheets on your bed the day you start using CHG soap. Use a clean washcloth (not used since being washed) for each shower. DO NOT sleep with pets once you start using the CHG.   CHG Shower Instructions:  Wash your face and private area with normal soap. If you choose to wash your hair, wash first with your normal shampoo.  After you use shampoo/soap, rinse your hair and body thoroughly to remove shampoo/soap residue.  Turn the water  OFF and apply about 3  tablespoons (45 ml) of CHG soap to a CLEAN washcloth.  Apply CHG soap ONLY FROM YOUR NECK DOWN TO YOUR TOES (washing for 3-5 minutes)  DO NOT use CHG soap on face, private areas, open wounds, or sores.  Pay special attention to the area where your surgery is being performed.  If you are having back surgery, having someone wash your back for you may be helpful. Wait 2 minutes after CHG soap is applied, then you may rinse off the CHG soap.  Pat dry with a clean towel  Put on clean clothes/pajamas   If you choose to wear lotion, please use ONLY the CHG-compatible lotions that are listed below.  Additional instructions for the day of surgery: DO NOT APPLY any lotions, deodorants, cologne, or perfumes.   Do not bring valuables to the hospital. [facility] is not responsible for any belongings/valuables. Do not wear nail polish, gel polish, artificial nails, or any other type of covering on natural nails (fingers and toes) Do not wear jewelry or makeup Put on clean/comfortable clothes.  Please brush your teeth.  Ask your nurse before applying any prescription medications to the skin.     CHG Compatible Lotions   Aveeno Moisturizing lotion  Cetaphil Moisturizing Cream  Cetaphil Moisturizing Lotion  Clairol Herbal Essence Moisturizing Lotion, Dry Skin  Clairol Herbal Essence Moisturizing Lotion, Extra Dry Skin  Clairol Herbal Essence Moisturizing Lotion, Normal Skin  Curel Age Defying Therapeutic Moisturizing Lotion with Alpha Hydroxy  Curel Extreme Care Body Lotion  Curel Soothing Hands Moisturizing Hand Lotion  Curel Therapeutic Moisturizing Cream, Fragrance-Free  Curel Therapeutic Moisturizing Lotion, Fragrance-Free  Curel Therapeutic Moisturizing Lotion, Original Formula  Eucerin Daily Replenishing Lotion  Eucerin Dry Skin Therapy Plus Alpha Hydroxy Crme  Eucerin Dry Skin Therapy Plus Alpha Hydroxy Lotion  Eucerin Original Crme  Eucerin Original Lotion  Eucerin Plus Crme  Eucerin Plus Lotion  Eucerin TriLipid Replenishing Lotion  Keri Anti-Bacterial Hand Lotion  Keri Deep Conditioning Original Lotion Dry Skin Formula Softly Scented  Keri Deep Conditioning Original Lotion, Fragrance Free Sensitive Skin Formula  Keri Lotion Fast Absorbing Fragrance Free Sensitive Skin Formula  Keri Lotion Fast Absorbing Softly Scented Dry Skin Formula  Keri Original Lotion  Keri Skin Renewal Lotion Keri Silky Smooth Lotion  Keri Silky Smooth Sensitive Skin Lotion  Nivea Body Creamy Conditioning Oil  Nivea Body Extra Enriched Lotion  Nivea Body Original Lotion  Nivea Body Sheer Moisturizing Lotion Nivea Crme  Nivea Skin Firming Lotion  NutraDerm 30 Skin Lotion  NutraDerm Skin Lotion  NutraDerm Therapeutic Skin Cream  NutraDerm Therapeutic Skin Lotion  ProShield Protective Hand Cream  Provon moisturizing lotion  Please read over the following fact sheets that you were given.

## 2024-03-27 NOTE — Progress Notes (Signed)
 PCP - Dr Alvia Awkward Cardiologist - Dr Teddie Favre (clearance on 03/25/24)  Chest x-ray - n/a EKG - 06/12/23 Stress Test - 04/20/22 (Normal) ECHO - 09/24/14 Cardiac Cath - 11/09/10  ICD Pacemaker/Loop - n/a  Sleep Study -  n/a  Diabetes - n/a  Blood Thinner Instructions:  n/a  Aspirin  Instructions: Patient agrees to call MD's office for ASA instructions.  MD's phone number given to patient.  NPO  Anesthesia review: Yes  STOP now taking any Aspirin  (unless otherwise instructed by your surgeon), Aleve, Naproxen, Ibuprofen, Motrin, Advil, Goody's, BC's, all herbal medications, fish oil, and all vitamins.   Coronavirus Screening Do you have any of the following symptoms:  Cough yes/no: No Fever (>100.67F)  yes/no: No Runny nose yes/no: No Sore throat yes/no: No Difficulty breathing/shortness of breath  yes/no: No  Have you traveled in the last 14 days and where? yes/no: No  Patient verbalized understanding of instructions that were given to them at the PAT appointment. Patient was also instructed that they will need to review over the PAT instructions again at home before surgery.

## 2024-03-28 ENCOUNTER — Encounter (HOSPITAL_COMMUNITY): Payer: Self-pay

## 2024-03-28 ENCOUNTER — Other Ambulatory Visit: Payer: Self-pay

## 2024-03-28 ENCOUNTER — Encounter (HOSPITAL_COMMUNITY)
Admission: RE | Admit: 2024-03-28 | Discharge: 2024-03-28 | Disposition: A | Discharge status: Home / Self Care | Source: Ambulatory Visit | Attending: Neurosurgery | Admitting: Neurosurgery

## 2024-03-28 VITALS — BP 129/71 | HR 56 | Temp 98.3°F | Resp 18 | Ht 68.0 in | Wt 211.8 lb

## 2024-03-28 DIAGNOSIS — I2510 Atherosclerotic heart disease of native coronary artery without angina pectoris: Secondary | ICD-10-CM | POA: Insufficient documentation

## 2024-03-28 DIAGNOSIS — M48062 Spinal stenosis, lumbar region with neurogenic claudication: Secondary | ICD-10-CM | POA: Insufficient documentation

## 2024-03-28 DIAGNOSIS — Z01812 Encounter for preprocedural laboratory examination: Secondary | ICD-10-CM | POA: Insufficient documentation

## 2024-03-28 DIAGNOSIS — Z951 Presence of aortocoronary bypass graft: Secondary | ICD-10-CM | POA: Diagnosis not present

## 2024-03-28 DIAGNOSIS — R7303 Prediabetes: Secondary | ICD-10-CM | POA: Diagnosis not present

## 2024-03-28 DIAGNOSIS — Z85828 Personal history of other malignant neoplasm of skin: Secondary | ICD-10-CM | POA: Insufficient documentation

## 2024-03-28 DIAGNOSIS — E785 Hyperlipidemia, unspecified: Secondary | ICD-10-CM | POA: Diagnosis not present

## 2024-03-28 DIAGNOSIS — Z8546 Personal history of malignant neoplasm of prostate: Secondary | ICD-10-CM | POA: Insufficient documentation

## 2024-03-28 DIAGNOSIS — Z87891 Personal history of nicotine dependence: Secondary | ICD-10-CM | POA: Insufficient documentation

## 2024-03-28 DIAGNOSIS — Z01818 Encounter for other preprocedural examination: Secondary | ICD-10-CM

## 2024-03-28 DIAGNOSIS — I252 Old myocardial infarction: Secondary | ICD-10-CM | POA: Diagnosis not present

## 2024-03-28 DIAGNOSIS — Z96652 Presence of left artificial knee joint: Secondary | ICD-10-CM | POA: Insufficient documentation

## 2024-03-28 DIAGNOSIS — I10 Essential (primary) hypertension: Secondary | ICD-10-CM | POA: Diagnosis not present

## 2024-03-28 HISTORY — DX: Unspecified malignant neoplasm of skin, unspecified: C44.90

## 2024-03-28 LAB — TYPE AND SCREEN
ABO/RH(D): O NEG
Antibody Screen: NEGATIVE

## 2024-03-28 LAB — CBC
HCT: 41.4 % (ref 39.0–52.0)
Hemoglobin: 13.5 g/dL (ref 13.0–17.0)
MCH: 29.9 pg (ref 26.0–34.0)
MCHC: 32.6 g/dL (ref 30.0–36.0)
MCV: 91.8 fL (ref 80.0–100.0)
Platelets: 195 10*3/uL (ref 150–400)
RBC: 4.51 MIL/uL (ref 4.22–5.81)
RDW: 12.6 % (ref 11.5–15.5)
WBC: 7.1 10*3/uL (ref 4.0–10.5)
nRBC: 0 % (ref 0.0–0.2)

## 2024-03-28 LAB — BASIC METABOLIC PANEL WITH GFR
Anion gap: 8 (ref 5–15)
BUN: 19 mg/dL (ref 8–23)
CO2: 28 mmol/L (ref 22–32)
Calcium: 9 mg/dL (ref 8.9–10.3)
Chloride: 104 mmol/L (ref 98–111)
Creatinine, Ser: 1.21 mg/dL (ref 0.61–1.24)
GFR, Estimated: >60 mL/min (ref >60)
Glucose, Bld: 110 mg/dL — ABNORMAL HIGH (ref 70–99)
Potassium: 4.1 mmol/L (ref 3.5–5.1)
Sodium: 140 mmol/L (ref 135–145)

## 2024-03-28 LAB — SURGICAL PCR SCREEN
MRSA, PCR: NEGATIVE
Staphylococcus aureus: NEGATIVE

## 2024-03-29 NOTE — Anesthesia Preprocedure Evaluation (Addendum)
 Anesthesia Evaluation  Patient identified by MRN, date of birth, ID band Patient awake    Reviewed: Allergy & Precautions, NPO status , Patient's Chart, lab work & pertinent test results, reviewed documented beta blocker date and time   History of Anesthesia Complications Negative for: history of anesthetic complications  Airway Mallampati: II  TM Distance: >3 FB Neck ROM: Full    Dental  (+) Dental Advisory Given   Pulmonary former smoker   breath sounds clear to auscultation       Cardiovascular hypertension, Pt. on home beta blockers and Pt. on medications (-) angina + CAD, + Past MI and + CABG   Rhythm:Regular Rate:Normal  '23 Stress: normal perfusion,EF 68%   Neuro/Psych negative neurological ROS     GI/Hepatic negative GI ROS, Neg liver ROS,,,  Endo/Other  BMI 31.6  Renal/GU negative Renal ROS     Musculoskeletal  (+) Arthritis ,    Abdominal   Peds  Hematology negative hematology ROS (+)   Anesthesia Other Findings Prostate cancer  Reproductive/Obstetrics                             Anesthesia Physical Anesthesia Plan  ASA: 3  Anesthesia Plan: General   Post-op Pain Management:    Induction: Intravenous  PONV Risk Score and Plan: 2 and Dexamethasone  and Ondansetron   Airway Management Planned: Oral ETT  Additional Equipment: None  Intra-op Plan:   Post-operative Plan: Extubation in OR  Informed Consent: I have reviewed the patients History and Physical, chart, labs and discussed the procedure including the risks, benefits and alternatives for the proposed anesthesia with the patient or authorized representative who has indicated his/her understanding and acceptance.     Dental advisory given  Plan Discussed with: CRNA and Surgeon  Anesthesia Plan Comments: (PAT note written 03/29/2024 by Allison Zelenak, PA-C.  )       Anesthesia Quick Evaluation

## 2024-03-29 NOTE — Progress Notes (Addendum)
 Anesthesia Chart Review:  Case: [id number] Date/Time: 04/09/24 0715   Procedure: POSTERIOR LUMBAR FUSION 1 LEVEL - PLIF L45 3C   Anesthesia type: General   Diagnosis: Lumbar stenosis with neurogenic claudication [M48.062]   Pre-op diagnosis: LUMBAR STENOSIS WITH NEUROGENIC CLAUDICATION   [facility]: [facility]OM [facility] / [facility]   Surgeons: Augusto Blonder, MD       DISCUSSION: Patient is a 73 year old male scheduled for the above procedure.  History includes former smoker (quit 1990), HTN, HLD, CAD (NSTEMI, s/p CABG: LIMA-LAD, left RA-OM2, SVG-D3, SVg-PDA 11/09/10), prostate cancer (s/p radical prostatectomy 06/15/06), pre-diabetes, skin caner, osteoarthritis (left uni-knee 06/01/15, right TKA 06/06/22).  Last cardiology office visit was 02/06/2024 with Lasalle Pointer, NP.  CAD stable at that time with no anginal symptoms. Last LDL 86, so atorvastatin  increased.  BP stable.  GDMT limited due to orthostatic dizziness in the past.  Cardiology follow-up in 6 months planned. Additional preoperative input outlined by Slater Duncan, NP on 03/26/24, "Given past medical history and time since last visit, based on ACC/AHA guidelines, Ralph [name] would be at acceptable risk for the planned procedure without further cardiovascular testing..."    Anesthesia team to evaluate on the day of surgery.   VS: BP 129/71   Pulse (!) 56   Temp 36.8 C   Resp 18   Ht 5\' 8"  (1.727 m)   Wt 96.1 kg   SpO2 97%   BMI 32.20 kg/m   PROVIDERS: Orlena Bitters, MD is PCP Teddie Favre, MD is cardiologist   LABS: Labs reviewed: Acceptable for surgery. (all labs ordered are listed, but only abnormal results are displayed)  Labs Reviewed  BASIC METABOLIC PANEL WITH GFR - Abnormal; Notable for the following components:      Result Value   Glucose, Bld 110 (*)    All other components within normal limits  SURGICAL PCR SCREEN  CBC  TYPE AND SCREEN     IMAGES: MRI L-spine 11/01/2022 ([facility]):   IMPRESSION: 1. Generalized lumbar spine degeneration with hyperlordosis and L4-5 anterolisthesis. 2. Degenerative and congenital spinal stenosis that is severe at L4-5 and moderate at L3-4. 3. L3-4 to L5-S1 biforaminal impingement greatest on the right at L4-5.    EKG: 06/12/2023:  Normal sinus rhythm Cannot rule out Inferior infarct (cited on or before 10-Nov-2010) Confirmed by Theotis Flake [phone]) on 06/12/2023 11:44:59 AM - Negative T waves in III, aVF, V6   CV: Carotid duplex 04/25/2023:  IMPRESSION: Color duplex indicates moderate heterogeneous and calcified plaque, with no hemodynamically significant stenosis by duplex criteria in the extracranial cerebrovascular circulation.   Lexiscan  04/20/2022:    The study is normal. There are no perfusion defects.  The study is low risk.   No ST deviation was noted.   LV perfusion is normal.   Left ventricular function is normal. Nuclear stress EF: 68 %. The left ventricular ejection fraction is hyperdynamic (>65%). End diastolic cavity size is normal.   Echocardiogram 09/24/2014: Study Conclusions  - Left ventricle: The cavity size was normal. Wall thickness was    normal. Systolic function was normal. The estimated ejection    fraction was in the range of 60% to 65%. Wall motion was normal;    there were no regional wall motion abnormalities. Doppler    parameters are consistent with abnormal left ventricular    relaxation (grade 1 diastolic dysfunction).  - Aortic valve: Mildly calcified annulus. Probably trileaflet.    There was no significant regurgitation.  - Mitral  valve: Calcified annulus. Mildly thickened leaflets .    There was trivial regurgitation.  - Left atrium: The atrium was mildly dilated.  - Right atrium: The atrium was mildly dilated.  - Tricuspid valve: There was trivial regurgitation. Peak RV-RA    gradient (S): 30 mm Hg.  - Inferior vena cava: Not visualized. Unable to estimate CVP.  - Pericardium,  extracardiac: There was no pericardial effusion.     Past Medical History:  Diagnosis Date   Arthritis    CAD (coronary artery disease)    Multivessel status post CABG December 2011 - Dr. Alva Jewels   Essential hypertension    History of kidney stones    surgery to remove   Hyperlipidemia    Neck pain    no current problems   NSTEMI (non-ST elevated myocardial infarction) (HCC) 10/21/2010   Pre-diabetes    no meds   Prostate cancer [facility])    Radical prostatectomy   Skin cancer    arms    Past Surgical History:  Procedure Laterality Date   COLONOSCOPY N/A 07/31/2019   Procedure: COLONOSCOPY;  Surgeon: Ruby Corporal, MD;  [facility]: [facility];  Service: Endoscopy;  Laterality: N/A;  830   CORONARY ARTERY BYPASS GRAFT  December 2011   LIMA to LAD, L Radial to OM2, SVG to DX, SVG to PD   CYSTOSCOPY WITH LITHOLAPAXY N/A 10/01/2019   Procedure: CYSTOSCOPY WITH LITHOLAPAXY, HOLMIUM LASER OF BLADDER STONE;  Surgeon: Homero Luster, MD;  Location: [facility];  Service: Urology;  Laterality: N/A;   KNEE ARTHROSCOPY Left 12/18/2013   Procedure: LEFT ARTHROSCOPY KNEE WITH DEBRIDMENT medial and lateral menisectomy condroplasty;  Surgeon: Aurther Blue, MD;  Location: [facility];  Service: Orthopedics;  Laterality: Left;   LITHOTRIPSY     X 2   PARTIAL KNEE ARTHROPLASTY Left 06/01/2015   Procedure: LEFT KNEE MEDIAL UNICOMPARTMENTAL ARTHROPLASTY;  Surgeon: Liliane Rei, MD;  Location: [facility];  Service: Orthopedics;  Laterality: Left;   POLYPECTOMY  07/31/2019   Procedure: POLYPECTOMY;  Surgeon: Ruby Corporal, MD;  [facility];  Service: Endoscopy;;  cecum,ascending,transverse,splenic flexure   PROSTATECTOMY  2008   TONSILLECTOMY     TOTAL KNEE ARTHROPLASTY Right 06/06/2022   Procedure: TOTAL KNEE ARTHROPLASTY;  Surgeon: Murleen Arms, MD;  [facility]: [facility];  Service: Orthopedics;  Laterality: Right;    MEDICATIONS:  aspirin  EC 81 MG tablet   atorvastatin  (LIPITOR) 40 MG tablet    ibuprofen (ADVIL) 200 MG tablet   isosorbide  mononitrate (IMDUR ) 30 MG 24 hr tablet   metoprolol  tartrate (LOPRESSOR ) 25 MG tablet   Omega-3 Fatty Acids (FISH OIL) 1200 MG CPDR   No current facility-administered medications for this encounter.   At PAT, he was advised to follow surgeon instructions regarding perioperative ASA.   Ella Gun, PA-C Surgical Short Stay/Anesthesiology [facility] Phone [phone] [facility] Phone [phone] 03/29/2024 3:30 PM

## 2024-04-10 NOTE — Progress Notes (Signed)
 Patient had a PAT appt. on 03/28/24 for surgery on 04/09/24.  Surgery was rescheduled to Friday, 04/12/24 at 7:30 AM.  Patient was re-instructed for new surgery date and time. Patient to arrive at 5:30 AM DOS.

## 2024-04-12 ENCOUNTER — Encounter (HOSPITAL_COMMUNITY): Payer: Self-pay | Admitting: Neurosurgery

## 2024-04-12 ENCOUNTER — Other Ambulatory Visit: Payer: Self-pay

## 2024-04-12 ENCOUNTER — Observation Stay (HOSPITAL_COMMUNITY)
Admission: RE | Admit: 2024-04-12 | Discharge: 2024-04-13 | Disposition: A | Discharge status: Home / Self Care | Attending: Neurosurgery | Admitting: Neurosurgery

## 2024-04-12 ENCOUNTER — Ambulatory Visit (HOSPITAL_COMMUNITY)

## 2024-04-12 ENCOUNTER — Ambulatory Visit (HOSPITAL_COMMUNITY): Payer: Self-pay | Admitting: Vascular Surgery

## 2024-04-12 ENCOUNTER — Ambulatory Visit (HOSPITAL_BASED_OUTPATIENT_CLINIC_OR_DEPARTMENT_OTHER): Admitting: Vascular Surgery

## 2024-04-12 ENCOUNTER — Encounter (HOSPITAL_COMMUNITY)
Admission: RE | Disposition: A | Discharge status: Home / Self Care | Payer: Self-pay | Source: Home / Self Care | Attending: Neurosurgery

## 2024-04-12 DIAGNOSIS — M48061 Spinal stenosis, lumbar region without neurogenic claudication: Secondary | ICD-10-CM | POA: Diagnosis not present

## 2024-04-12 DIAGNOSIS — Z951 Presence of aortocoronary bypass graft: Secondary | ICD-10-CM | POA: Insufficient documentation

## 2024-04-12 DIAGNOSIS — M4316 Spondylolisthesis, lumbar region: Principal | ICD-10-CM | POA: Insufficient documentation

## 2024-04-12 DIAGNOSIS — M4306 Spondylolysis, lumbar region: Secondary | ICD-10-CM | POA: Diagnosis not present

## 2024-04-12 DIAGNOSIS — Z85828 Personal history of other malignant neoplasm of skin: Secondary | ICD-10-CM | POA: Insufficient documentation

## 2024-04-12 DIAGNOSIS — Z8546 Personal history of malignant neoplasm of prostate: Secondary | ICD-10-CM | POA: Insufficient documentation

## 2024-04-12 DIAGNOSIS — I2510 Atherosclerotic heart disease of native coronary artery without angina pectoris: Secondary | ICD-10-CM | POA: Diagnosis not present

## 2024-04-12 DIAGNOSIS — E785 Hyperlipidemia, unspecified: Secondary | ICD-10-CM

## 2024-04-12 DIAGNOSIS — Z87891 Personal history of nicotine dependence: Secondary | ICD-10-CM | POA: Diagnosis not present

## 2024-04-12 DIAGNOSIS — I10 Essential (primary) hypertension: Secondary | ICD-10-CM

## 2024-04-12 DIAGNOSIS — Z7982 Long term (current) use of aspirin: Secondary | ICD-10-CM | POA: Diagnosis not present

## 2024-04-12 DIAGNOSIS — Z96653 Presence of artificial knee joint, bilateral: Secondary | ICD-10-CM | POA: Diagnosis not present

## 2024-04-12 DIAGNOSIS — M48062 Spinal stenosis, lumbar region with neurogenic claudication: Secondary | ICD-10-CM | POA: Insufficient documentation

## 2024-04-12 DIAGNOSIS — Z79899 Other long term (current) drug therapy: Secondary | ICD-10-CM | POA: Diagnosis not present

## 2024-04-12 DIAGNOSIS — Z981 Arthrodesis status: Secondary | ICD-10-CM | POA: Diagnosis not present

## 2024-04-12 MED ORDER — MIDAZOLAM HCL 2 MG/2ML IJ SOLN: 0.5000 mg | Freq: Once | INTRAMUSCULAR | Status: DC | PRN

## 2024-04-12 MED ORDER — LIDOCAINE 2% (20 MG/ML) 5 ML SYRINGE
INTRAMUSCULAR | Status: DC | PRN
  Administered 2024-04-12: 40 mg via INTRAVENOUS

## 2024-04-12 MED ORDER — CEFAZOLIN SODIUM-DEXTROSE 2-4 GM/100ML-% IV SOLN
2.0000 g | Freq: Three times a day (TID) | INTRAVENOUS | Status: AC
  Administered 2024-04-12 – 2024-04-13 (×2): 2 g via INTRAVENOUS
  Filled 2024-04-12 (×2): qty 100

## 2024-04-12 MED ORDER — SODIUM CHLORIDE 0.9 % IV SOLN: INTRAVENOUS | Status: DC | PRN

## 2024-04-12 MED ORDER — THROMBIN 5000 UNITS EX SOLR
OROMUCOSAL | Status: DC | PRN
  Administered 2024-04-12: 20 mL via TOPICAL

## 2024-04-12 MED ORDER — SODIUM CHLORIDE 0.9% FLUSH
3.0000 mL | Freq: Two times a day (BID) | INTRAVENOUS | Status: DC
  Administered 2024-04-12 – 2024-04-13 (×2): 3 mL via INTRAVENOUS

## 2024-04-12 MED ORDER — LIDOCAINE 2% (20 MG/ML) 5 ML SYRINGE
INTRAMUSCULAR | Status: AC
Start: 2024-04-12 — End: ?
  Filled 2024-04-12: qty 5

## 2024-04-12 MED ORDER — FENTANYL CITRATE (PF) 250 MCG/5ML IJ SOLN
INTRAMUSCULAR | Status: DC | PRN
  Administered 2024-04-12 (×3): 50 ug via INTRAVENOUS
  Administered 2024-04-12 (×2): 100 ug via INTRAVENOUS

## 2024-04-12 MED ORDER — PROPOFOL 10 MG/ML IV BOLUS
INTRAVENOUS | Status: DC | PRN
Start: 2024-04-12 — End: 2024-04-12
  Administered 2024-04-12: 150 mg via INTRAVENOUS

## 2024-04-12 MED ORDER — DEXMEDETOMIDINE HCL IN NACL 80 MCG/20ML IV SOLN
INTRAVENOUS | Status: DC | PRN
  Administered 2024-04-12 (×2): 8 ug via INTRAVENOUS
  Administered 2024-04-12: 12 ug via INTRAVENOUS

## 2024-04-12 MED ORDER — 0.9 % SODIUM CHLORIDE (POUR BTL) OPTIME
TOPICAL | Status: DC | PRN
  Administered 2024-04-12: 1000 mL

## 2024-04-12 MED ORDER — ACETAMINOPHEN 325 MG PO TABS: 650.0000 mg | ORAL_TABLET | ORAL | Status: DC | PRN

## 2024-04-12 MED ORDER — ONDANSETRON HCL 4 MG/2ML IJ SOLN
INTRAMUSCULAR | Status: DC | PRN
  Administered 2024-04-12: 4 mg via INTRAVENOUS

## 2024-04-12 MED ORDER — ALBUMIN HUMAN 5 % IV SOLN: INTRAVENOUS | Status: DC | PRN

## 2024-04-12 MED ORDER — OMEGA-3-ACID ETHYL ESTERS 1 G PO CAPS
1000.0000 mg | ORAL_CAPSULE | Freq: Every day | ORAL | Status: DC
  Administered 2024-04-12 – 2024-04-13 (×2): 1000 mg via ORAL
  Filled 2024-04-12 (×2): qty 1

## 2024-04-12 MED ORDER — ACETAMINOPHEN 650 MG RE SUPP: 650.0000 mg | RECTAL | Status: DC | PRN

## 2024-04-12 MED ORDER — DOCUSATE SODIUM 100 MG PO CAPS
100.0000 mg | ORAL_CAPSULE | Freq: Two times a day (BID) | ORAL | Status: DC
  Administered 2024-04-12 – 2024-04-13 (×3): 100 mg via ORAL
  Filled 2024-04-12 (×3): qty 1

## 2024-04-12 MED ORDER — THROMBIN 5000 UNITS EX KIT
PACK | CUTANEOUS | Status: AC
  Filled 2024-04-12: qty 1

## 2024-04-12 MED ORDER — SENNA 8.6 MG PO TABS
1.0000 | ORAL_TABLET | Freq: Two times a day (BID) | ORAL | Status: DC
  Administered 2024-04-12 – 2024-04-13 (×3): 8.6 mg via ORAL
  Filled 2024-04-12 (×3): qty 1

## 2024-04-12 MED ORDER — FENTANYL CITRATE (PF) 250 MCG/5ML IJ SOLN
INTRAMUSCULAR | Status: AC
  Filled 2024-04-12: qty 5

## 2024-04-12 MED ORDER — OXYCODONE HCL 5 MG PO TABS
10.0000 mg | ORAL_TABLET | ORAL | Status: DC | PRN
  Administered 2024-04-12 – 2024-04-13 (×2): 10 mg via ORAL
  Filled 2024-04-12 (×2): qty 2

## 2024-04-12 MED ORDER — ONDANSETRON HCL 4 MG/2ML IJ SOLN
INTRAMUSCULAR | Status: AC
  Filled 2024-04-12: qty 2

## 2024-04-12 MED ORDER — SODIUM CHLORIDE 0.9 % IV SOLN
250.0000 mL | INTRAVENOUS | Status: DC
  Administered 2024-04-12: 250 mL via INTRAVENOUS

## 2024-04-12 MED ORDER — SODIUM CHLORIDE 0.9% FLUSH: 3.0000 mL | INTRAVENOUS | Status: DC | PRN

## 2024-04-12 MED ORDER — OXYCODONE HCL 5 MG PO TABS: 5.0000 mg | ORAL_TABLET | ORAL | Status: DC | PRN

## 2024-04-12 MED ORDER — ROCURONIUM BROMIDE 10 MG/ML (PF) SYRINGE
PREFILLED_SYRINGE | INTRAVENOUS | Status: AC
  Filled 2024-04-12: qty 10

## 2024-04-12 MED ORDER — ISOSORBIDE MONONITRATE ER 30 MG PO TB24
30.0000 mg | ORAL_TABLET | Freq: Every day | ORAL | Status: DC
  Administered 2024-04-13: 30 mg via ORAL
  Filled 2024-04-12: qty 1

## 2024-04-12 MED ORDER — PHENYLEPHRINE 80 MCG/ML (10ML) SYRINGE FOR IV PUSH (FOR BLOOD PRESSURE SUPPORT)
PREFILLED_SYRINGE | INTRAVENOUS | Status: DC | PRN
  Administered 2024-04-12 (×2): 80 ug via INTRAVENOUS

## 2024-04-12 MED ORDER — MEPERIDINE HCL 25 MG/ML IJ SOLN: 6.2500 mg | INTRAMUSCULAR | Status: DC | PRN

## 2024-04-12 MED ORDER — METHOCARBAMOL 1000 MG/10ML IJ SOLN: 500.0000 mg | Freq: Four times a day (QID) | INTRAMUSCULAR | Status: DC | PRN

## 2024-04-12 MED ORDER — MIDAZOLAM HCL 2 MG/2ML IJ SOLN
INTRAMUSCULAR | Status: DC | PRN
  Administered 2024-04-12: 2 mg via INTRAVENOUS

## 2024-04-12 MED ORDER — METHOCARBAMOL 500 MG PO TABS
500.0000 mg | ORAL_TABLET | Freq: Four times a day (QID) | ORAL | Status: DC | PRN
  Administered 2024-04-13: 500 mg via ORAL
  Filled 2024-04-12: qty 1

## 2024-04-12 MED ORDER — PHENOL 1.4 % MT LIQD: 1.0000 | OROMUCOSAL | Status: DC | PRN

## 2024-04-12 MED ORDER — LACTATED RINGERS IV SOLN: INTRAVENOUS | Status: DC | PRN

## 2024-04-12 MED ORDER — ONDANSETRON HCL 4 MG PO TABS: 4.0000 mg | ORAL_TABLET | Freq: Four times a day (QID) | ORAL | Status: DC | PRN

## 2024-04-12 MED ORDER — ATORVASTATIN CALCIUM 80 MG PO TABS
80.0000 mg | ORAL_TABLET | Freq: Every day | ORAL | Status: DC
  Administered 2024-04-13: 80 mg via ORAL
  Filled 2024-04-12: qty 1

## 2024-04-12 MED ORDER — HYDROMORPHONE HCL 1 MG/ML IJ SOLN
INTRAMUSCULAR | Status: AC
  Filled 2024-04-12: qty 1

## 2024-04-12 MED ORDER — DEXMEDETOMIDINE HCL IN NACL 80 MCG/20ML IV SOLN
INTRAVENOUS | Status: AC
  Filled 2024-04-12: qty 20

## 2024-04-12 MED ORDER — SUGAMMADEX SODIUM 200 MG/2ML IV SOLN
INTRAVENOUS | Status: DC | PRN
  Administered 2024-04-12: 200 mg via INTRAVENOUS

## 2024-04-12 MED ORDER — HYDROMORPHONE HCL 1 MG/ML IJ SOLN
0.2500 mg | INTRAMUSCULAR | Status: DC | PRN
  Administered 2024-04-12 (×2): 0.5 mg via INTRAVENOUS

## 2024-04-12 MED ORDER — ROCURONIUM BROMIDE 10 MG/ML (PF) SYRINGE
PREFILLED_SYRINGE | INTRAVENOUS | Status: DC | PRN
Start: 2024-04-12 — End: 2024-04-12
  Administered 2024-04-12: 10 mg via INTRAVENOUS
  Administered 2024-04-12: 70 mg via INTRAVENOUS
  Administered 2024-04-12 (×3): 10 mg via INTRAVENOUS

## 2024-04-12 MED ORDER — MORPHINE SULFATE (PF) 2 MG/ML IV SOLN: 2.0000 mg | INTRAVENOUS | Status: DC | PRN

## 2024-04-12 MED ORDER — LIDOCAINE-EPINEPHRINE 1 %-1:100000 IJ SOLN
INTRAMUSCULAR | Status: DC | PRN
  Administered 2024-04-12: 5 mL

## 2024-04-12 MED ORDER — FENTANYL CITRATE (PF) 100 MCG/2ML IJ SOLN
INTRAMUSCULAR | Status: AC
  Filled 2024-04-12: qty 2

## 2024-04-12 MED ORDER — OXYCODONE HCL 5 MG PO TABS: 5.0000 mg | ORAL_TABLET | Freq: Once | ORAL | Status: DC | PRN

## 2024-04-12 MED ORDER — PHENYLEPHRINE HCL-NACL 20-0.9 MG/250ML-% IV SOLN
INTRAVENOUS | Status: DC | PRN
  Administered 2024-04-12: 15 ug/min via INTRAVENOUS

## 2024-04-12 MED ORDER — BISACODYL 10 MG RE SUPP: 10.0000 mg | Freq: Every day | RECTAL | Status: DC | PRN

## 2024-04-12 MED ORDER — MIDAZOLAM HCL 2 MG/2ML IJ SOLN
INTRAMUSCULAR | Status: AC
  Filled 2024-04-12: qty 2

## 2024-04-12 MED ORDER — BUPIVACAINE HCL (PF) 0.5 % IJ SOLN
INTRAMUSCULAR | Status: DC | PRN
  Administered 2024-04-12: 5 mL

## 2024-04-12 MED ORDER — PROPOFOL 10 MG/ML IV BOLUS
INTRAVENOUS | Status: AC
  Filled 2024-04-12: qty 20

## 2024-04-12 MED ORDER — CHLORHEXIDINE GLUCONATE CLOTH 2 % EX PADS: 6.0000 | MEDICATED_PAD | Freq: Once | CUTANEOUS | Status: DC

## 2024-04-12 MED ORDER — BUPIVACAINE HCL (PF) 0.5 % IJ SOLN
INTRAMUSCULAR | Status: AC
  Filled 2024-04-12: qty 30

## 2024-04-12 MED ORDER — ACETAMINOPHEN 500 MG PO TABS
1000.0000 mg | ORAL_TABLET | Freq: Once | ORAL | Status: AC
  Administered 2024-04-12: 1000 mg via ORAL
  Filled 2024-04-12: qty 2

## 2024-04-12 MED ORDER — GLYCOPYRROLATE PF 0.2 MG/ML IJ SOSY
PREFILLED_SYRINGE | INTRAMUSCULAR | Status: DC | PRN
  Administered 2024-04-12: .2 mg via INTRAVENOUS

## 2024-04-12 MED ORDER — DEXAMETHASONE SODIUM PHOSPHATE 10 MG/ML IJ SOLN
INTRAMUSCULAR | Status: AC
  Filled 2024-04-12: qty 1

## 2024-04-12 MED ORDER — ONDANSETRON HCL 4 MG/2ML IJ SOLN: 4.0000 mg | Freq: Four times a day (QID) | INTRAMUSCULAR | Status: DC | PRN

## 2024-04-12 MED ORDER — SODIUM CHLORIDE (PF) 0.9 % IJ SOLN
INTRAMUSCULAR | Status: AC
  Filled 2024-04-12: qty 10

## 2024-04-12 MED ORDER — MENTHOL 3 MG MT LOZG: 1.0000 | LOZENGE | OROMUCOSAL | Status: DC | PRN

## 2024-04-12 MED ORDER — CEFAZOLIN SODIUM-DEXTROSE 2-4 GM/100ML-% IV SOLN
2.0000 g | INTRAVENOUS | Status: AC
  Administered 2024-04-12: 2 g via INTRAVENOUS
  Filled 2024-04-12: qty 100

## 2024-04-12 MED ORDER — GLYCOPYRROLATE PF 0.2 MG/ML IJ SOSY
PREFILLED_SYRINGE | INTRAMUSCULAR | Status: AC
  Filled 2024-04-12: qty 1

## 2024-04-12 MED ORDER — DEXAMETHASONE SODIUM PHOSPHATE 10 MG/ML IJ SOLN
INTRAMUSCULAR | Status: DC | PRN
Start: 2024-04-12 — End: 2024-04-12
  Administered 2024-04-12: 10 mg via INTRAVENOUS

## 2024-04-12 MED ORDER — LIDOCAINE-EPINEPHRINE 1 %-1:100000 IJ SOLN
INTRAMUSCULAR | Status: AC
  Filled 2024-04-12: qty 1

## 2024-04-12 MED ORDER — PANTOPRAZOLE SODIUM 40 MG IV SOLR
40.0000 mg | Freq: Every day | INTRAVENOUS | Status: DC
  Administered 2024-04-12: 40 mg via INTRAVENOUS
  Filled 2024-04-12: qty 10

## 2024-04-12 MED ORDER — CHLORHEXIDINE GLUCONATE 0.12 % MT SOLN
OROMUCOSAL | Status: AC
Start: 2024-04-12 — End: 2024-04-12
  Administered 2024-04-12: 15 mL
  Filled 2024-04-12: qty 15

## 2024-04-12 MED ORDER — OXYCODONE HCL 5 MG/5ML PO SOLN: 5.0000 mg | Freq: Once | ORAL | Status: DC | PRN

## 2024-04-12 MED ORDER — METOPROLOL TARTRATE 25 MG PO TABS
25.0000 mg | ORAL_TABLET | Freq: Two times a day (BID) | ORAL | Status: DC
Start: 2024-04-12 — End: 2024-04-13
  Administered 2024-04-12 – 2024-04-13 (×2): 25 mg via ORAL
  Filled 2024-04-12 (×2): qty 1

## 2024-04-12 SURGICAL SUPPLY — 3 items
NDL HYPO 18GX1.5 BLUNT FILL (NEEDLE) IMPLANT
NDL HYPO 22X1.5 SAFETY MO (MISCELLANEOUS) ×1 IMPLANT
NDL SPNL 18GX3.5 QUINCKE PK (NEEDLE) IMPLANT

## 2024-04-12 NOTE — Anesthesia Procedure Notes (Signed)
 Procedure Name: Intubation Date/Time: 04/12/2024 8:03 AM  Performed by: Jonne Netters, MDPre-anesthesia Checklist: Patient identified, Emergency Drugs available, Suction available and Patient being monitored Patient Re-evaluated:Patient Re-evaluated prior to induction Oxygen Delivery Method: Circle system utilized Preoxygenation: Pre-oxygenation with 100% oxygen Induction Type: IV induction Ventilation: Mask ventilation without difficulty and Oral airway inserted - appropriate to patient size Laryngoscope Size: Mac and 4 Grade View: Grade I Tube type: Oral Number of attempts: 1 Airway Equipment and Method: Stylet, Oral airway and Bite block Placement Confirmation: ETT inserted through vocal cords under direct vision, positive ETCO2 and breath sounds checked- equal and bilateral Secured at: 23 cm Tube secured with: Tape Dental Injury: Teeth and Oropharynx as per pre-operative assessment

## 2024-04-12 NOTE — Plan of Care (Signed)
   Problem: Education: Goal: Knowledge of General Education information will improve Description: Including pain rating scale, medication(s)/side effects and non-pharmacologic comfort measures Outcome: Progressing   Problem: Activity: Goal: Risk for activity intolerance will decrease Outcome: Progressing   Problem: Pain Managment: Goal: General experience of comfort will improve and/or be controlled Outcome: Progressing

## 2024-04-12 NOTE — Anesthesia Postprocedure Evaluation (Signed)
 Anesthesia Post Note  Patient: Ralph [name]  Procedure(s) Performed: POSTERIOR LUMBAR INTERBODY FUSION LUMBAR FOUR-FIVE 1 LEVEL     Patient location during evaluation: PACU Anesthesia Type: General Level of consciousness: awake and alert, patient cooperative and oriented Pain management: pain level controlled Vital Signs Assessment: post-procedure vital signs reviewed and stable Respiratory status: spontaneous breathing, nonlabored ventilation, respiratory function stable and patient connected to nasal cannula oxygen Cardiovascular status: blood pressure returned to baseline and stable Postop Assessment: no apparent nausea or vomiting Anesthetic complications: no   No notable events documented.  Last Vitals:  Vitals:   04/12/24 1322 04/12/24 1339  BP:  137/82  Pulse: 66 77  Resp: 12   Temp: (!) 36.4 C (!) 36.4 C  SpO2: 96% 90%    Last Pain:  Vitals:   04/12/24 1339  TempSrc: Oral  PainSc:                  [name],[name]

## 2024-04-12 NOTE — H&P (Addendum)
 HPI:     Ralph [name] is a 73 year old man I am seeing at the request of Dr. Perri Brake.  He is referred for relatively chronic back and left greater than right leg pain.  Symptoms started a little more than a year ago without any identifiable inciting event.  He describes pain more on the left side of his back and posterior hip, with some radiation to the posterior aspect of the thigh and posterolateral aspect of the calf and down into the left foot.  He does report similar symptoms on the right side but not as severe.  He says when he first gets up in the morning the pain is most severe.  It seems to be reasonably manageable when sitting or lying stationary.  Pain is exacerbated the longer he stands or walks.  Since the onset, the patient has undergone a course of physical therapy without lasting improvement.  He has in the past also had one or two chiropractic manipulations.  He has previously been on muscle relaxers and anti-inflammatory medications without significant improvement.  He has undergone a series of epidural steroid injections over the past year, initially with significant improvement however.  Recently he had an injection at L4-5 about a week ago with minimal improvement in symptoms.  He was therefore referred for discussion of potential surgical treatment options.  Of note, the patient reports a history of hypertension.  No history of diabetes.  He has a history of coronary artery disease status post bypass graft times four nearly 20 years ago.  He is currently maintained on a baby aspirin  daily.  He does not report any known lung, liver, or kidney disease. He has a history of prostate cancer about 13 years ago without any metastases.  He quit smoking about 35 years ago.  Patient is retired from work.     Patient Active Problem List   Diagnosis Date Noted   Special screening for malignant neoplasms, colon 12/13/2018   OA (osteoarthritis) of knee 06/01/2015   Pre-operative  cardiovascular examination 03/12/2015   Acute medial meniscal tear 12/17/2013   Sinus bradycardia 08/29/2013   Doppler ultrasound of carotid artery    Hx of CABG    Prostate cancer (HCC)    Neck pain    Murmur    Ejection fraction    CAD (coronary artery disease)    HTN (hypertension)    Hyperlipidemia    Past Medical History:  Diagnosis Date   Arthritis    CAD (coronary artery disease)    Multivessel status post CABG December 2011 - Dr. Alva Jewels   Essential hypertension    History of kidney stones    surgery to remove   Hyperlipidemia    Neck pain    no current problems   NSTEMI (non-ST elevated myocardial infarction) (HCC) 10/21/2010   Pre-diabetes    no meds   Prostate cancer (HCC)    Radical prostatectomy   Skin cancer    arms    Past Surgical History:  Procedure Laterality Date   COLONOSCOPY N/A 07/31/2019   Procedure: COLONOSCOPY;  Surgeon: Ruby Corporal, MD;  [facility];  Service: Endoscopy;  Laterality: N/A;  830   CORONARY ARTERY BYPASS GRAFT  December 2011   LIMA to LAD, L Radial to OM2, SVG to DX, SVG to PD   CYSTOSCOPY WITH LITHOLAPAXY N/A 10/01/2019   Procedure: CYSTOSCOPY WITH LITHOLAPAXY, HOLMIUM LASER OF BLADDER STONE;  Surgeon: Homero Luster, MD;  [facility];  Service: Urology;  Laterality: N/A;   KNEE ARTHROSCOPY Left 12/18/2013   Procedure: LEFT ARTHROSCOPY KNEE WITH DEBRIDMENT medial and lateral menisectomy condroplasty;  Surgeon: Aurther Blue, MD;  [facility];  Service: Orthopedics;  Laterality: Left;   LITHOTRIPSY     X 2   PARTIAL KNEE ARTHROPLASTY Left 06/01/2015   Procedure: LEFT KNEE MEDIAL UNICOMPARTMENTAL ARTHROPLASTY;  Surgeon: Liliane Rei, MD;  Location: [facility];  Service: Orthopedics;  Laterality: Left;   POLYPECTOMY  07/31/2019   Procedure: POLYPECTOMY;  Surgeon: Ruby Corporal, MD;  [facility];  Service: Endoscopy;;  cecum,ascending,transverse,splenic flexure   PROSTATECTOMY  2008   TONSILLECTOMY      TOTAL KNEE ARTHROPLASTY Right 06/06/2022   Procedure: TOTAL KNEE ARTHROPLASTY;  Surgeon: Murleen Arms, MD;  Location: [facility];  Service: Orthopedics;  Laterality: Right;    Medications Prior to Admission  Medication Sig Dispense Refill Last Dose/Taking   aspirin  EC 81 MG tablet Take 1 tablet (81 mg total) by mouth daily. Swallow whole. 90 tablet 3 Past Month   atorvastatin  (LIPITOR) 40 MG tablet Take 2 tablets (80 mg total) by mouth daily. 180 tablet 1 04/12/2024 at  4:00 AM   ibuprofen (ADVIL) 200 MG tablet Take 200 mg by mouth daily.   Past Month   isosorbide  mononitrate (IMDUR ) 30 MG 24 hr tablet TAKE 1/2 OF A TABLET (15 MG TOTAL) BY MOUTH DAILY 45 tablet 1 04/12/2024 at  4:00 AM   metoprolol  tartrate (LOPRESSOR ) 25 MG tablet TAKE 0.5 TABLETS BY MOUTH 2 TIMES DAILY. 90 tablet 3 04/12/2024 at  4:00 AM   Omega-3 Fatty Acids (FISH OIL) 1200 MG CPDR Take 1,200 mg by mouth daily. Omega3 360 mg   Past Month   No Known Allergies  Social History   Tobacco Use   Smoking status: Former    Current packs/day: 0.00    Average packs/day: 0.8 packs/day for 10.0 years (8.0 ttl pk-yrs)    Types: Cigarettes    Start date: 11/21/1978    Quit date: 11/21/1988    Years since quitting: 35.4   Smokeless tobacco: Never   Tobacco comments:    Stopped over 20 years ago.  Substance Use Topics   Alcohol  use: Not Currently    Comment: very seldom    Family History  Problem Relation Age of Onset   Alzheimer's disease Mother    Stroke Father      Objective:   Patient Vitals for the past 8 hrs:  BP Temp Temp src Pulse Resp SpO2 Height Weight  04/12/24 0546 (!) 183/87 97.9 F (36.6 C) Oral 62 17 97 % 5\' 8"  (1.727 m) 94.3 kg   No intake/output data recorded. No intake/output data recorded.   Awake, alert, oriented Speech fluent, appropriate CN grossly intact 5/5 BUE/BLE DTR wnl   Assessment:   73 year old man with back and left greater than right leg pain related to multifactorial stenosis  with spondylolisthesis at L4-5.  Unfortunately he has not had significant improvement with reasonable conservative treatment and continues to have pain limiting normal daily activities.   Plan:   -OR today for decompression and fusion at L4-5 including placement of interbody cage, interbody arthrodesis, posterior nonsegmental instrumentation, and use of bone autograft, allograft, BMP.  We discussed the details of the procedure as well as the expected postoperative course and recovery.  We discussed goals of the operation being pain reduction and improvement in function as well as the likelihood of achieving these goals. Specific risks of this  procedure were discussed in detail including but not limited to the risk of vascular injury which could lead to potentially life-threatening bleeding, nerve injury leading to leg, foot, bowel or bladder dysfunction, infection, and CSF leak.  General risks of anesthesia including heart attack, stroke, and blood clots were also discussed.   Augusto Blonder, MD [facility]

## 2024-04-12 NOTE — Progress Notes (Signed)
 Orthopedic Tech Progress Note Patient Details:  Ralph [name] 1951/08/03 [id number]  Ortho Devices Type of Ortho Device: Lumbar corsett Ortho Device/Splint Location: BACK Ortho Device/Splint Interventions: Ordered, Other (comment)   Post Interventions Patient Tolerated: Well Instructions Provided: Care of device  [name] 04/12/2024, 2:17 PM

## 2024-04-12 NOTE — Transfer of Care (Signed)
 Immediate Anesthesia Transfer of Care Note  Patient: Ralph [name]  Procedure(s) Performed: POSTERIOR LUMBAR INTERBODY FUSION LUMBAR FOUR-FIVE 1 LEVEL  Patient Location: PACU  Anesthesia Type:General  Level of Consciousness: patient cooperative  Airway & Oxygen Therapy: Patient Spontanous Breathing and Patient connected to nasal cannula oxygen  Post-op Assessment: Report given to RN  Post vital signs: Reviewed and stable  Last Vitals:  Vitals Value Taken Time  BP 150/97 04/12/24 1204  Temp 97.8   Pulse 80 04/12/24 1211  Resp 10 04/12/24 1211  SpO2 91 % 04/12/24 1211  Vitals shown include unfiled device data.  Last Pain:  Vitals:   04/12/24 0621  TempSrc:   PainSc: 9          Complications: No notable events documented.

## 2024-04-13 DIAGNOSIS — Z7982 Long term (current) use of aspirin: Secondary | ICD-10-CM | POA: Diagnosis not present

## 2024-04-13 DIAGNOSIS — Z951 Presence of aortocoronary bypass graft: Secondary | ICD-10-CM | POA: Diagnosis not present

## 2024-04-13 DIAGNOSIS — I10 Essential (primary) hypertension: Secondary | ICD-10-CM | POA: Diagnosis not present

## 2024-04-13 DIAGNOSIS — Z96653 Presence of artificial knee joint, bilateral: Secondary | ICD-10-CM | POA: Diagnosis not present

## 2024-04-13 DIAGNOSIS — M4316 Spondylolisthesis, lumbar region: Secondary | ICD-10-CM | POA: Diagnosis not present

## 2024-04-13 DIAGNOSIS — Z87891 Personal history of nicotine dependence: Secondary | ICD-10-CM | POA: Diagnosis not present

## 2024-04-13 DIAGNOSIS — I2510 Atherosclerotic heart disease of native coronary artery without angina pectoris: Secondary | ICD-10-CM | POA: Diagnosis not present

## 2024-04-13 DIAGNOSIS — Z85828 Personal history of other malignant neoplasm of skin: Secondary | ICD-10-CM | POA: Diagnosis not present

## 2024-04-13 DIAGNOSIS — M48062 Spinal stenosis, lumbar region with neurogenic claudication: Secondary | ICD-10-CM | POA: Diagnosis not present

## 2024-04-13 DIAGNOSIS — Z79899 Other long term (current) drug therapy: Secondary | ICD-10-CM | POA: Diagnosis not present

## 2024-04-13 DIAGNOSIS — M4306 Spondylolysis, lumbar region: Secondary | ICD-10-CM | POA: Diagnosis not present

## 2024-04-13 MED ORDER — DOCUSATE SODIUM 100 MG PO CAPS: 100.0000 mg | ORAL_CAPSULE | Freq: Two times a day (BID) | ORAL | 0 refills | Status: DC

## 2024-04-13 MED ORDER — OXYCODONE HCL 10 MG PO TABS: 10.0000 mg | ORAL_TABLET | ORAL | 0 refills | Status: DC | PRN

## 2024-04-13 MED ORDER — METHOCARBAMOL 500 MG PO TABS: 500.0000 mg | ORAL_TABLET | Freq: Four times a day (QID) | ORAL | 2 refills | Status: DC | PRN

## 2024-04-13 NOTE — Care Management Obs Status (Signed)
 MEDICARE OBSERVATION STATUS NOTIFICATION   Patient Details  Name: Ralph [name] MRN: [id number] Date of Birth: 12-18-1950   Medicare Observation Status Notification Given:  Yes    Jannine Meo, RN 04/13/2024, 8:38 AM

## 2024-04-13 NOTE — Evaluation (Signed)
 Physical Therapy Evaluation & Discharge Patient Details Name: Ralph [name] MRN: [id number] DOB: 03/26/1951 Today's Date: 04/13/2024  History of Present Illness  Pt is a 73 y/o male admitted 5/23 for L4-5 PLIF in setting of lumbar stenosis with neurogenic claudication and lumbar spondylolysis. PMH: OA of knee, bradycardia, hx of CABG, prostate cancer, CAD, HTN, HLD  Clinical Impression  Pt presents with condition above. PTA, he was independent without DME, living with his wife in a 2-level house with 7 + 7 STE. Currently, he reports he is at his baseline gait pattern and balance, demonstrating a slightly widened stance and mild trunk sway when ambulating. He reports the "sciatica" down his R leg is now gone and demonstrates intact and WFL bil lower extremity strength and sensation. He was able to ambulate without AD and navigate stairs with x1 handrail support without LOB or physical assistance. All education completed and questions answered. As pt is at his baseline functional status, no further PT needs identified. Will sign off.      If plan is discharge home, recommend the following: Assistance with cooking/housework;Assist for transportation   Can travel by private vehicle        Equipment Recommendations None recommended by PT  Recommendations for Other Services       Functional Status Assessment Patient has not had a recent decline in their functional status     Precautions / Restrictions Precautions Precautions: Back Precaution Booklet Issued: Yes (comment) Required Braces or Orthoses: Spinal Brace Spinal Brace: Lumbar corset;Applied in sitting position Restrictions Weight Bearing Restrictions Per Provider Order: No      Mobility  Bed Mobility               General bed mobility comments: Pt standing in room with OT upon arrival, sitting on EOB end of session    Transfers Overall transfer level: Needs assistance Equipment used: None Transfers: Sit to/from  Stand Sit to Stand: Supervision           General transfer comment: Pt able to transfer stand to sit without LOB, supervision for safety    Ambulation/Gait Ambulation/Gait assistance: Supervision Gait Distance (Feet): 120 Feet Assistive device: None Gait Pattern/deviations: Step-through pattern, Decreased stride length, Wide base of support Gait velocity: WFL Gait velocity interpretation: 1.31 - 2.62 ft/sec, indicative of limited community ambulator   General Gait Details: Pt demonstrates a mild trunk sway with slightly widened BOS. He reports he feels he is ambulating at his baseline pattern/balance. No LOB, supervision for safety  Stairs Stairs: Yes Stairs assistance: Supervision Stair Management: One rail Left, One rail Right, Alternating pattern, Forwards Number of Stairs: 10 General stair comments: Ascends with L rail and descends with R to simulate home set-up. Pt able to navigate stairs with alternating step pattern without LOB, supervision for safety  Wheelchair Mobility     Tilt Bed    Modified Rankin (Stroke Patients Only)       Balance Overall balance assessment: Mild deficits observed, not formally tested                                           Pertinent Vitals/Pain Pain Assessment Pain Assessment: Faces Faces Pain Scale: Hurts a little bit Pain Location: back Pain Descriptors / Indicators: Sore Pain Intervention(s): Limited activity within patient's tolerance, Monitored during session, Repositioned    Home Living Family/patient expects to be discharged  to:: Private residence Living Arrangements: Spouse/significant other Available Help at Discharge: Family;Available 24 hours/day Type of Home: House Home Access: Stairs to enter Entrance Stairs-Rails: Left Entrance Stairs-Number of Steps: 7 + 7   Home Layout: Two level Home Equipment: Agricultural consultant (2 wheels);Shower seat - built in      Prior Function Prior Level of  Function : Independent/Modified Independent;Driving             Mobility Comments: no AD, was playing golf ADLs Comments: Indep with ADLs, IADLs, driving     Extremity/Trunk Assessment   Upper Extremity Assessment Upper Extremity Assessment: Defer to OT evaluation    Lower Extremity Assessment Lower Extremity Assessment: Overall WFL for tasks assessed (denied any numbness/tingling bil currently, reports hx of "sciatica" down R leg but has now resolved since surgery per pt; MMT scores of 5/5 grossly bil)    Cervical / Trunk Assessment Cervical / Trunk Assessment: Back Surgery  Communication   Communication Communication: No apparent difficulties    Cognition Arousal: Alert Behavior During Therapy: WFL for tasks assessed/performed   PT - Cognitive impairments: No apparent impairments                         Following commands: Intact       Cueing Cueing Techniques: Verbal cues     General Comments General comments (skin integrity, edema, etc.): educated pt on car transfer technique and on ambulating x 3 longer walks/day to tolerance    Exercises     Assessment/Plan    PT Assessment Patient does not need any further PT services  PT Problem List         PT Treatment Interventions      PT Goals (Current goals can be found in the Care Plan section)  Acute Rehab PT Goals Patient Stated Goal: to go home PT Goal Formulation: All assessment and education complete, DC therapy Time For Goal Achievement: 04/14/24 Potential to Achieve Goals: Good    Frequency       Co-evaluation               AM-PAC PT "6 Clicks" Mobility  Outcome Measure Help needed turning from your back to your side while in a flat bed without using bedrails?: None Help needed moving from lying on your back to sitting on the side of a flat bed without using bedrails?: None Help needed moving to and from a bed to a chair (including a wheelchair)?: A Little Help needed  standing up from a chair using your arms (e.g., wheelchair or bedside chair)?: A Little Help needed to walk in hospital room?: A Little Help needed climbing 3-5 steps with a railing? : A Little 6 Click Score: 20    End of Session Equipment Utilized During Treatment: Back brace Activity Tolerance: Patient tolerated treatment well Patient left: in bed;with call bell/phone within reach (sitting EOB) Nurse Communication: Mobility status PT Visit Diagnosis: Other abnormalities of gait and mobility (R26.89);Pain Pain - Right/Left:  (back) Pain - part of body:  (back)    Time: 1610-9604 PT Time Calculation (min) (ACUTE ONLY): 9 min   Charges:   PT Evaluation $PT Eval Low Complexity: 1 Low   PT General Charges $$ ACUTE PT VISIT: 1 Visit         Vernida Goodie, PT, DPT Acute Rehabilitation Services  Office: [phone]   Ellyn Hack 04/13/2024, 11:23 AM

## 2024-04-13 NOTE — Discharge Summary (Signed)
  Patient ID: KION HUNTSBERRY MRN: [id number] DOB/AGE: 74/07/1951 74 y.o.  Admit date: 04/12/2024 Discharge date: 04/13/2024  Admission Diagnoses: Lumbar stenosis with neurogenic claudication [M48.062] Lumbar spondylolysis [M43.06]   Discharge Diagnoses: Same   Discharged Condition: Stable  Hospital Course:  Ralph [name] is a 73 y.o. male who was admitted following an uncomplicated L4-5 PLIF. They were recovered in PACU and transferred to the floor. Hospital course was uncomplicated. Pt stable for discharge today. Pt to f/u in office for routine post op visit. Pt is in agreement w/ plan.    Discharge Exam: Blood pressure (!) 160/79, pulse 83, temperature 98.3 F (36.8 C), temperature source Oral, resp. rate 18, height 5\' 8"  (1.727 m), weight 94.3 kg, SpO2 95%. A&O Speech fluent, appropriate Strength/sensation grossly intact BUE/BLE.  Dressing c/d/I.   Disposition: Discharge disposition: 01-Home or Self Care       Discharge Instructions     Incentive spirometry RT   Complete by: As directed       Allergies as of 04/13/2024   No Known Allergies      Medication List     PAUSE taking these medications    aspirin  EC 81 MG tablet Wait to take this until: Apr 18, 2024 Take 1 tablet (81 mg total) by mouth daily. Swallow whole.       STOP taking these medications    ibuprofen 200 MG tablet Commonly known as: ADVIL       TAKE these medications    atorvastatin  40 MG tablet Commonly known as: LIPITOR Take 2 tablets (80 mg total) by mouth daily.   docusate sodium  100 MG capsule Commonly known as: COLACE Take 1 capsule (100 mg total) by mouth 2 (two) times daily.   Fish Oil 1200 MG Cpdr Take 1,200 mg by mouth daily. Omega3 360 mg   isosorbide  mononitrate 30 MG 24 hr tablet Commonly known as: IMDUR  TAKE 1/2 OF A TABLET (15 MG TOTAL) BY MOUTH DAILY   methocarbamol  500 MG tablet Commonly known as: ROBAXIN  Take 1 tablet (500 mg total) by mouth every 6  (six) hours as needed for muscle spasms.   metoprolol  tartrate 25 MG tablet Commonly known as: LOPRESSOR  TAKE 0.5 TABLETS BY MOUTH 2 TIMES DAILY.   Oxycodone  HCl 10 MG Tabs Take 1 tablet (10 mg total) by mouth every 4 (four) hours as needed for severe pain (pain score 7-10).         Signed: Jamise Pentland CAYLIN Aaradhya Kysar 04/13/2024, 9:15 AM

## 2024-04-13 NOTE — Evaluation (Signed)
 Occupational Therapy Evaluation Patient Details Name: Ralph [name] MRN: [id number] DOB: 06/21/1951 Today's Date: 04/13/2024   History of Present Illness   Pt is a 73 y/o male admitted for L4-5 PLIF in setting of lumbar stenosis with neurogenic claudication and lumbar spondylolysis. PMH: OA of knee, bradycardia, hx of CABG, prostate cancer, CAD, HTN, HLD     Clinical Impressions PTA, pt lives with spouse, typically Independent in all ADLs, IADLs, driving and mobility without AD. Pt presents now with minor post op soreness, able to mobilize in room without AD and without overt LOB. Educated re: back precautions for ADLs, general body mechanics and IADL considerations. Pt requiring Min A for brace mgmt and LB dressing but able to manage all other tasks. Encouraged use of shower seat at home to decrease fall risk. Pt confirms wife can assist as needed w/ ADLs. No further OT services needed at this time.      If plan is discharge home, recommend the following:   A little help with bathing/dressing/bathroom;Assistance with cooking/housework;Assist for transportation;Help with stairs or ramp for entrance     Functional Status Assessment   Patient has had a recent decline in their functional status and demonstrates the ability to make significant improvements in function in a reasonable and predictable amount of time.     Equipment Recommendations   None recommended by OT     Recommendations for Other Services         Precautions/Restrictions   Precautions Precautions: Back Precaution Booklet Issued: Yes (comment) Required Braces or Orthoses: Spinal Brace Spinal Brace: Lumbar corset Restrictions Weight Bearing Restrictions Per Provider Order: No     Mobility Bed Mobility Overal bed mobility: Modified Independent                  Transfers Overall transfer level: Needs assistance Equipment used: None Transfers: Sit to/from Stand Sit to Stand: Supervision                   Balance Overall balance assessment: No apparent balance deficits (not formally assessed)                                         ADL either performed or assessed with clinical judgement   ADL Overall ADL's : Needs assistance/impaired Eating/Feeding: Independent   Grooming: Modified independent;Standing;Oral care Grooming Details (indicate cue type and reason): educated and provided cup to use for rinsing to avoid excessive bending post op Upper Body Bathing: Modified independent   Lower Body Bathing: Supervison/ safety;Sit to/from stand;Sitting/lateral leans   Upper Body Dressing : Minimal assistance;Sitting Upper Body Dressing Details (indicate cue type and reason): able to don shirt sitting EOB. Min A needed for LSO brace mgmt Lower Body Dressing: Minimal assistance;Sit to/from stand;Sitting/lateral leans Lower Body Dressing Details (indicate cue type and reason): able to don underwear and shorts sitting EOB, assist needed for socks due to difficulty reaching without bending. able to don slip on Scientist, research (medical): Supervision/safety;Ambulation   Toileting- Clothing Manipulation and Hygiene: Modified independent;Sitting/lateral lean;Sit to/from stand       Functional mobility during ADLs: Supervision/safety General ADL Comments: Educated re: back precautions for bed mobility, ADLs including use of shower seat for safety, IADL considerations and where assistance may be needed     Vision Ability to See in Adequate Light: 0 Adequate Patient Visual Report: No change from baseline Vision Assessment?:  No apparent visual deficits     Perception         Praxis         Pertinent Vitals/Pain Pain Assessment Pain Assessment: Faces Faces Pain Scale: Hurts a little bit Pain Location: back Pain Descriptors / Indicators: Sore Pain Intervention(s): Monitored during session, Limited activity within patient's tolerance      Extremity/Trunk Assessment Upper Extremity Assessment Upper Extremity Assessment: Overall WFL for tasks assessed;Right hand dominant   Lower Extremity Assessment Lower Extremity Assessment: Defer to PT evaluation   Cervical / Trunk Assessment Cervical / Trunk Assessment: Back Surgery   Communication Communication Communication: No apparent difficulties   Cognition Arousal: Alert Behavior During Therapy: WFL for tasks assessed/performed Cognition: No apparent impairments                               Following commands: Intact       Cueing  General Comments   Cueing Techniques: Verbal cues      Exercises     Shoulder Instructions      Home Living Family/patient expects to be discharged to:: Private residence Living Arrangements: Spouse/significant other Available Help at Discharge: Family;Available 24 hours/day Type of Home: House Home Access: Stairs to enter [employer] of Steps: 7 + 7   Home Layout: Two level     Bathroom Shower/Tub: Tub/shower unit;Walk-in shower   Bathroom Toilet: Standard     Home Equipment: Agricultural consultant (2 wheels);Shower seat - built in          Prior Functioning/Environment Prior Level of Function : Independent/Modified Independent;Driving             Mobility Comments: no AD, was playing golf ADLs Comments: Indep with ADLs, IADLs, driving    OT Problem List:     OT Treatment/Interventions:        OT Goals(Current goals can be found in the care plan section)   Acute Rehab OT Goals Patient Stated Goal: home today OT Goal Formulation: All assessment and education complete, DC therapy   OT Frequency:       Co-evaluation              AM-PAC OT "6 Clicks" Daily Activity     Outcome Measure Help from another person eating meals?: None Help from another person taking care of personal grooming?: None Help from another person toileting, which includes using toliet, bedpan, or  urinal?: None Help from another person bathing (including washing, rinsing, drying)?: A Little Help from another person to put on and taking off regular upper body clothing?: None Help from another person to put on and taking off regular lower body clothing?: A Little 6 Click Score: 22   End of Session Equipment Utilized During Treatment: Back brace Nurse Communication: Mobility status  Activity Tolerance: Patient tolerated treatment well Patient left: Other (comment) (to ambulate with PT)  OT Visit Diagnosis: Other abnormalities of gait and mobility (R26.89)                Time: 0981-1914 OT Time Calculation (min): 17 min Charges:  OT General Charges $OT Visit: 1 Visit OT Evaluation $OT Eval Low Complexity: 1 Low  Lawrence Pretty, OTR/L Acute Rehab Services Office: [phone]   Shireen Dory 04/13/2024, 10:14 AM

## 2024-04-16 NOTE — Op Note (Signed)
 NEUROSURGERY OPERATIVE NOTE   PREOP DIAGNOSIS:  1. Spondylolisthesis L4-5 2. Lumbar spinal stenosis, L4-5  POSTOP DIAGNOSIS: Same  PROCEDURE: 1. L4 laminectomy with facetectomy for decompression of exiting nerve roots, more than would be required for placement of interbody graft 2. Placement of anterior interbody device - Medtronic expandable 9mm cage x 2 3. Posterior non-segmental instrumentation using Medtronic cortical pedicle screws - L4-L5 4. Interbody arthrodesis, L4-5 5. Use of locally harvested bone autograft 6. Use of non-structural bone allograft - Proteios, DBM  SURGEON: Dr. Augusto Blonder, MD  ASSISTANT: Easter Golden, PA-C  ANESTHESIA: General Endotracheal  EBL: 250cc  SPECIMENS: None  DRAINS: None  COMPLICATIONS: None immediate  CONDITION: Hemodynamically stable to PACU  HISTORY: Ralph [name] is a 73 y.o. male who has been followed in the outpatient clinic with back and leg pain related to stenosis with spondylolisthesis at L4-5. Multiple conservative treatments were attempted without significant improvement and the patient ultimately elected to proceed with surgical decompression and fusion. Risks, benefits, and alternative treatments were reviewed in detail in the office. After all questions were answered, informed consent was obtained and witnessed.  PROCEDURE IN DETAIL: The patient was brought to the operating room via stretcher. After induction of general anesthesia, the patient was positioned on the operative table in the prone position. All pressure points were meticulously padded. Incision was then marked out and prepped and draped in the usual sterile fashion.  After timeout was conducted, skin was infiltrated with local anesthetic.  Spinal needle was introduced in order to identify the surface projection of the L4-5 interspace.  Skin incision was then made sharply and Bovie electrocautery was used to dissect the subcutaneous tissue until the  lumbodorsal fascia was identified and incised. The muscle was then elevated in the subperiosteal plane and the L4 lamina and L4-5 facet complexes were identified. Self-retaining retractors were then placed. Lateral fluoroscopy was taken with a dissector in the L4-5 interspace to confirm our location.  At this point attention was turned to decompression. Complete L4 laminectomy was completed with a high-speed drill and Kerrison punches.  Normal dura was identified.  A ball-tipped dissector was then used to identify the foramina bilaterally.  High-speed drill was used to cut across the pars interarticularis and the inferior articulating process of L4 was removed bilaterally.  Kerrison punches and the high-speed drill were used to remove the medial and superior aspect of the superior articulating process of L5 bilaterally.  In this fashion, the entire foramen was unroofed to decompress the traversing L5 nerve root and the exiting L4 nerve roots bilaterally.  Once this was done I was able to easily pass a small ball dissector out the foramen indicating good decompression.  At this point attention was turned to the discectomy.  Disc space was then identified, incised bilaterally, and using a combination of shavers, curettes and rongeurs, complete discectomy was completed. Endplates were prepared, and bone harvested during decompression was mixed with proteios and packed into the interspace. A 9mm cage was tapped into place bilaterally and cages were expanded to achieve good endplate apposition.  Good position was confirmed with fluoroscopy.  At this point, the entry points for bilateral L4 and L5 cortical pedicle screws were identified using standard anatomic landmarks and lateral fluoro. Pilot holes were then drilled and tapped to 6.5 x 35mm. Screws were then placed in L4 and L5 measuring 6.5 x 40mm. Prebent lordotic rod was then sized and placed into the pedicle screws. Set screws were placed and  final tightened.  Final AP and lateral fluoroscopic images confirmed good position.  Hemostasis was secured and confirmed with bipolar cautery and morcellized gelfoam with thrombin. The wound was then irrigated with copious amounts of antibiotic saline, then closed in standard fashion using a combination of interrupted 0 and 3-0 Vicryl stitches in the muscular, fascial, and subcutaneous layers. Skin was then closed using standard Dermabond. Sterile dressing was then applied. The patient was then transferred to the stretcher, extubated, and taken to the postanesthesia care unit in stable hemodynamic condition.  At the end of the case all sponge, needle, cottonoid, and instrument counts were correct.   Augusto Blonder, MD [facility]

## 2024-04-19 DIAGNOSIS — Z9889 Other specified postprocedural states: Secondary | ICD-10-CM | POA: Diagnosis not present

## 2024-04-19 DIAGNOSIS — Z09 Encounter for follow-up examination after completed treatment for conditions other than malignant neoplasm: Secondary | ICD-10-CM | POA: Diagnosis not present

## 2024-04-19 DIAGNOSIS — I10 Essential (primary) hypertension: Secondary | ICD-10-CM | POA: Diagnosis not present

## 2024-04-19 DIAGNOSIS — Z299 Encounter for prophylactic measures, unspecified: Secondary | ICD-10-CM | POA: Diagnosis not present

## 2024-04-19 DIAGNOSIS — K5900 Constipation, unspecified: Secondary | ICD-10-CM | POA: Diagnosis not present

## 2024-05-01 DIAGNOSIS — M4316 Spondylolisthesis, lumbar region: Secondary | ICD-10-CM | POA: Diagnosis not present

## 2024-06-04 DIAGNOSIS — H35341 Macular cyst, hole, or pseudohole, right eye: Secondary | ICD-10-CM | POA: Diagnosis not present

## 2024-06-04 DIAGNOSIS — H524 Presbyopia: Secondary | ICD-10-CM | POA: Diagnosis not present

## 2024-06-06 DIAGNOSIS — H35341 Macular cyst, hole, or pseudohole, right eye: Secondary | ICD-10-CM | POA: Diagnosis not present

## 2024-06-06 DIAGNOSIS — Z961 Presence of intraocular lens: Secondary | ICD-10-CM | POA: Diagnosis not present

## 2024-06-06 DIAGNOSIS — H3581 Retinal edema: Secondary | ICD-10-CM | POA: Diagnosis not present

## 2024-06-06 DIAGNOSIS — H35033 Hypertensive retinopathy, bilateral: Secondary | ICD-10-CM | POA: Diagnosis not present

## 2024-06-20 ENCOUNTER — Other Ambulatory Visit: Payer: Self-pay | Admitting: Cardiology

## 2024-07-03 ENCOUNTER — Encounter (INDEPENDENT_AMBULATORY_CARE_PROVIDER_SITE_OTHER): Payer: Self-pay | Admitting: *Deleted

## 2024-07-10 DIAGNOSIS — H35341 Macular cyst, hole, or pseudohole, right eye: Secondary | ICD-10-CM | POA: Diagnosis not present

## 2024-07-10 DIAGNOSIS — H35371 Puckering of macula, right eye: Secondary | ICD-10-CM | POA: Diagnosis not present

## 2024-07-18 DIAGNOSIS — H35341 Macular cyst, hole, or pseudohole, right eye: Secondary | ICD-10-CM | POA: Diagnosis not present

## 2024-07-25 DIAGNOSIS — I10 Essential (primary) hypertension: Secondary | ICD-10-CM | POA: Diagnosis not present

## 2024-07-25 DIAGNOSIS — I25118 Atherosclerotic heart disease of native coronary artery with other forms of angina pectoris: Secondary | ICD-10-CM | POA: Diagnosis not present

## 2024-07-25 DIAGNOSIS — Z87891 Personal history of nicotine dependence: Secondary | ICD-10-CM | POA: Diagnosis not present

## 2024-07-25 DIAGNOSIS — Z713 Dietary counseling and surveillance: Secondary | ICD-10-CM | POA: Diagnosis not present

## 2024-07-25 DIAGNOSIS — Z299 Encounter for prophylactic measures, unspecified: Secondary | ICD-10-CM | POA: Diagnosis not present

## 2024-07-25 DIAGNOSIS — Z7189 Other specified counseling: Secondary | ICD-10-CM | POA: Diagnosis not present

## 2024-07-25 DIAGNOSIS — Z1389 Encounter for screening for other disorder: Secondary | ICD-10-CM | POA: Diagnosis not present

## 2024-07-25 DIAGNOSIS — R52 Pain, unspecified: Secondary | ICD-10-CM | POA: Diagnosis not present

## 2024-07-25 DIAGNOSIS — Z0000 Encounter for general adult medical examination without abnormal findings: Secondary | ICD-10-CM | POA: Diagnosis not present

## 2024-07-31 DIAGNOSIS — M4316 Spondylolisthesis, lumbar region: Secondary | ICD-10-CM | POA: Diagnosis not present

## 2024-08-26 DIAGNOSIS — H35341 Macular cyst, hole, or pseudohole, right eye: Secondary | ICD-10-CM | POA: Diagnosis not present

## 2024-08-30 ENCOUNTER — Other Ambulatory Visit: Payer: Self-pay | Admitting: Nurse Practitioner

## 2024-09-03 ENCOUNTER — Other Ambulatory Visit (HOSPITAL_BASED_OUTPATIENT_CLINIC_OR_DEPARTMENT_OTHER): Payer: Self-pay

## 2024-09-03 ENCOUNTER — Ambulatory Visit: Attending: Cardiology | Admitting: Cardiology

## 2024-09-03 ENCOUNTER — Encounter: Payer: Self-pay | Admitting: Cardiology

## 2024-09-03 VITALS — BP 120/64 | HR 70 | Ht 68.0 in | Wt 221.8 lb

## 2024-09-03 DIAGNOSIS — I25119 Atherosclerotic heart disease of native coronary artery with unspecified angina pectoris: Secondary | ICD-10-CM

## 2024-09-03 DIAGNOSIS — I10 Essential (primary) hypertension: Secondary | ICD-10-CM

## 2024-09-03 DIAGNOSIS — I2510 Atherosclerotic heart disease of native coronary artery without angina pectoris: Secondary | ICD-10-CM

## 2024-09-03 DIAGNOSIS — E782 Mixed hyperlipidemia: Secondary | ICD-10-CM | POA: Diagnosis not present

## 2024-09-03 MED ORDER — LISINOPRIL 2.5 MG PO TABS: 2.5000 mg | ORAL_TABLET | Freq: Every day | ORAL | 2 refills | Status: AC

## 2024-09-03 MED ORDER — LISINOPRIL 2.5 MG PO TABS
2.5000 mg | ORAL_TABLET | Freq: Every day | ORAL | 2 refills | Status: DC
  Filled 2024-09-03: qty 90, 90d supply, fill #0

## 2024-09-03 NOTE — Patient Instructions (Addendum)
 Medication Instructions:  Your physician has recommended you make the following change in your medication:  Restart lisinopril  2.5 mg daily Continue all medications as prescribed  Labwork: none  Testing/Procedures: none  Follow-Up: Your physician recommends that you schedule a follow-up appointment in: 6 months  Any Other Special Instructions Will Be Listed Below (If Applicable).  If you need a refill on your cardiac medications before your next appointment, please call your pharmacy.

## 2024-09-03 NOTE — Progress Notes (Signed)
    Cardiology Office Note  Date: 09/03/2024   ID: Ralph [name], DOB [birth year], MRN [id number]  History of Present Illness: Ralph [name] is a 73 y.o. male last seen in March by Ms. Miriam NP, I reviewed her note.  I have not seen him since 2021.  He is here for a routine visit.  Reports no angina on current medical regimen.  Has been somewhat rundown but tells me that his dog has been diagnosed with cancer and he is getting up at night to let him go outside regularly.  Emotionally having an impact on him as well.  I went over his medications.  He did start back on lisinopril  5 mg daily, I asked him to reduce it to 2.5 mg daily.  He is tolerating Lipitor which was increased to 80 mg daily at last visit.  He will have lab work with PCP later in the month.  I reviewed his ECG today which shows normal sinus rhythm with IVCD, rule out old inferior infarct pattern.  Physical Exam: VS:  BP 120/64   Pulse 70   Ht 5' 8 (1.727 m)   Wt 221 lb 12.8 oz (100.6 kg)   SpO2 96%   BMI 33.72 kg/m , BMI Body mass index is 33.72 kg/m.  Wt Readings from Last 3 Encounters:  09/03/24 221 lb 12.8 oz (100.6 kg)  04/12/24 208 lb (94.3 kg)  03/28/24 211 lb 12.8 oz (96.1 kg)    General: Patient appears comfortable at rest. HEENT: Conjunctiva and lids normal. Neck: Supple, no elevated JVP or carotid bruits. Lungs: Clear to auscultation, nonlabored breathing at rest. Cardiac: Regular rate and rhythm, no S3, 2/6 systolic murmur. Extremities: No pitting edema.  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  Labwork: October 2024: Cholesterol 171, triglycerides 98, HDL 67, LDL 86, TSH 1.29 03/28/2024: BUN 19; Creatinine, Ser 1.21; Hemoglobin 13.5; Platelets 195; Potassium 4.1; Sodium 140   Other Studies Reviewed Today:  Carotid Dopplers 04/25/2023: IMPRESSION: Color duplex indicates  moderate heterogeneous and calcified plaque, with no hemodynamically significant stenosis by duplex criteria in the extracranial cerebrovascular circulation.  Assessment and Plan:  1.  Multivessel CAD status post CABG in 2011 with LIMA to LAD, left radial to OM 2, SVG to diagonal, and SVG to PDA.  He does not report any progressive angina on medical therapy.  ECG reviewed.  Plan to continue aspirin  81 mg daily, Imdur  30 mg daily, and Lipitor 80 mg daily.  2.  Primary hypertension.  Continue lisinopril  2.5 mg daily and Lopressor  25 mg twice daily.  3.  Mixed hyperlipidemia.  Continue Lipitor 80 mg daily.  Recheck FLP with PCP later this month.  Would ideally like LDL at least under 70.  Disposition:  Follow up 6 months.  Signed, Jayson JUDITHANN Sierras, M.D., F.A.C.C. [facility]

## 2024-09-03 NOTE — Addendum Note (Signed)
 Addended by: Jenayah Antu M on: 09/03/2024 02:13 PM   Modules accepted: Orders

## 2024-09-04 ENCOUNTER — Ambulatory Visit: Admitting: Cardiology

## 2024-09-05 ENCOUNTER — Other Ambulatory Visit: Payer: Self-pay | Admitting: Cardiology

## 2024-09-16 ENCOUNTER — Telehealth: Payer: Self-pay | Admitting: Cardiology

## 2024-09-16 ENCOUNTER — Other Ambulatory Visit (HOSPITAL_BASED_OUTPATIENT_CLINIC_OR_DEPARTMENT_OTHER): Payer: Self-pay

## 2024-09-16 MED ORDER — ATORVASTATIN CALCIUM 80 MG PO TABS: 80.0000 mg | ORAL_TABLET | Freq: Every day | ORAL | 3 refills | Status: AC

## 2024-09-16 MED ORDER — ATORVASTATIN CALCIUM 80 MG PO TABS
80.0000 mg | ORAL_TABLET | Freq: Every day | ORAL | 3 refills | Status: DC
Start: 2024-09-16 — End: 2024-09-16
  Filled 2024-09-16: qty 90, 90d supply, fill #0

## 2024-09-16 NOTE — Telephone Encounter (Signed)
 Pt is having a hard time getting his atorvastatin  from the pharmacy- the pharmacy is saying it's pending and they have sent the request 3 times

## 2024-09-16 NOTE — Telephone Encounter (Signed)
 Request refill for atorvastatin  be sent to [facility].

## 2024-09-17 ENCOUNTER — Ambulatory Visit: Admitting: Cardiology

## 2024-09-27 ENCOUNTER — Other Ambulatory Visit: Payer: Self-pay | Admitting: Cardiology

## 2024-12-09 ENCOUNTER — Telehealth: Payer: Self-pay | Admitting: Cardiology

## 2024-12-09 NOTE — Telephone Encounter (Signed)
" °*  STAT* If patient is at the pharmacy, call can be transferred to refill team.   1. Which medications need to be refilled? (please list name of each medication and dose if known)   atorvastatin  (LIPITOR) 80 MG tablet   2. Would you like to learn more about the convenience, safety, & potential cost savings by using the [facility]?   3. Are you open to using the [facility] (Type [facility]. ).  4. Which pharmacy/location (including street and city if local pharmacy) is medication to be sent to?  [id number] - [place], [place] - [address]   5. Do they need a 30 day or 90 day supply?   90 day  Patient stated he has a couple of days left of this medication.  Patient has appointment scheduled with Dr. Debera on 4/15.   "

## 2025-03-05 ENCOUNTER — Ambulatory Visit: Admitting: Cardiology
